# Patient Record
Sex: Female | Born: 1975
Health system: Southern US, Community
[De-identification: ages and names within clinical notes are randomized; demographics above are authoritative.]

## PROBLEM LIST (undated history)

## (undated) ENCOUNTER — Emergency Department (HOSPITAL_COMMUNITY): Disposition: A | Discharge status: Home / Self Care | Payer: Self-pay

## (undated) DIAGNOSIS — R9082 White matter disease, unspecified: Secondary | ICD-10-CM

## (undated) DIAGNOSIS — K219 Gastro-esophageal reflux disease without esophagitis: Secondary | ICD-10-CM

## (undated) DIAGNOSIS — E041 Nontoxic single thyroid nodule: Secondary | ICD-10-CM

## (undated) DIAGNOSIS — K5900 Constipation, unspecified: Secondary | ICD-10-CM

## (undated) DIAGNOSIS — R102 Pelvic and perineal pain unspecified side: Secondary | ICD-10-CM

## (undated) DIAGNOSIS — B977 Papillomavirus as the cause of diseases classified elsewhere: Secondary | ICD-10-CM

## (undated) DIAGNOSIS — G44229 Chronic tension-type headache, not intractable: Secondary | ICD-10-CM

## (undated) DIAGNOSIS — G8929 Other chronic pain: Secondary | ICD-10-CM

## (undated) DIAGNOSIS — M545 Low back pain, unspecified: Secondary | ICD-10-CM

## (undated) DIAGNOSIS — F32A Depression, unspecified: Secondary | ICD-10-CM

## (undated) DIAGNOSIS — N946 Dysmenorrhea, unspecified: Secondary | ICD-10-CM

## (undated) DIAGNOSIS — M542 Cervicalgia: Secondary | ICD-10-CM

## (undated) DIAGNOSIS — E785 Hyperlipidemia, unspecified: Secondary | ICD-10-CM

## (undated) DIAGNOSIS — F419 Anxiety disorder, unspecified: Secondary | ICD-10-CM

## (undated) DIAGNOSIS — R7989 Other specified abnormal findings of blood chemistry: Secondary | ICD-10-CM

## (undated) DIAGNOSIS — R5383 Other fatigue: Secondary | ICD-10-CM

## (undated) DIAGNOSIS — Z5189 Encounter for other specified aftercare: Secondary | ICD-10-CM

## (undated) DIAGNOSIS — Z9889 Other specified postprocedural states: Secondary | ICD-10-CM

## (undated) DIAGNOSIS — H9313 Tinnitus, bilateral: Secondary | ICD-10-CM

## (undated) DIAGNOSIS — M26609 Unspecified temporomandibular joint disorder, unspecified side: Secondary | ICD-10-CM

## (undated) DIAGNOSIS — G5600 Carpal tunnel syndrome, unspecified upper limb: Secondary | ICD-10-CM

## (undated) DIAGNOSIS — M7500 Adhesive capsulitis of unspecified shoulder: Secondary | ICD-10-CM

## (undated) DIAGNOSIS — N840 Polyp of corpus uteri: Secondary | ICD-10-CM

## (undated) HISTORY — DX: Anxiety disorder, unspecified: F41.9

## (undated) HISTORY — DX: Dysmenorrhea, unspecified: N94.6

## (undated) HISTORY — DX: Depression, unspecified: F32.A

## (undated) HISTORY — DX: Other chronic pain: G89.29

## (undated) HISTORY — DX: Adhesive capsulitis of unspecified shoulder: M75.00

## (undated) HISTORY — PX: CHOLECYSTECTOMY: SHX55

## (undated) HISTORY — DX: Hyperlipidemia, unspecified: E78.5

## (undated) HISTORY — DX: Chronic tension-type headache, not intractable: G44.229

## (undated) HISTORY — PX: TONSILLECTOMY: SUR1361

## (undated) HISTORY — DX: Cervicalgia: M54.2

## (undated) HISTORY — PX: WISDOM TOOTH EXTRACTION: SHX21

## (undated) HISTORY — DX: Encounter for other specified aftercare: Z51.89

## (undated) HISTORY — PX: WRIST SURGERY: SHX841

## (undated) HISTORY — PX: DE QUERVAIN'S RELEASE: SHX1439

---

## 1999-03-11 ENCOUNTER — Ambulatory Visit (HOSPITAL_BASED_OUTPATIENT_CLINIC_OR_DEPARTMENT_OTHER): Admission: RE | Admit: 1999-03-11 | Discharge: 1999-03-11 | Payer: Self-pay | Admitting: *Deleted

## 1999-07-02 ENCOUNTER — Other Ambulatory Visit: Admission: RE | Admit: 1999-07-02 | Discharge: 1999-07-02 | Payer: Self-pay | Admitting: Obstetrics and Gynecology

## 2003-06-28 ENCOUNTER — Emergency Department (HOSPITAL_COMMUNITY): Admission: EM | Admit: 2003-06-28 | Discharge: 2003-06-28 | Payer: Self-pay | Admitting: Emergency Medicine

## 2004-05-29 ENCOUNTER — Inpatient Hospital Stay (HOSPITAL_COMMUNITY): Admission: EM | Admit: 2004-05-29 | Discharge: 2004-05-31 | Payer: Self-pay | Admitting: Emergency Medicine

## 2006-06-28 ENCOUNTER — Emergency Department (HOSPITAL_COMMUNITY): Admission: EM | Admit: 2006-06-28 | Discharge: 2006-06-28 | Payer: Self-pay | Admitting: Emergency Medicine

## 2007-01-04 ENCOUNTER — Emergency Department (HOSPITAL_COMMUNITY): Admission: EM | Admit: 2007-01-04 | Discharge: 2007-01-04 | Payer: Self-pay | Admitting: Emergency Medicine

## 2007-05-09 ENCOUNTER — Emergency Department (HOSPITAL_COMMUNITY): Admission: EM | Admit: 2007-05-09 | Discharge: 2007-05-09 | Payer: Self-pay | Admitting: Emergency Medicine

## 2007-05-10 ENCOUNTER — Emergency Department (HOSPITAL_COMMUNITY): Admission: EM | Admit: 2007-05-10 | Discharge: 2007-05-10 | Payer: Self-pay | Admitting: Emergency Medicine

## 2007-11-06 ENCOUNTER — Emergency Department: Payer: Self-pay | Admitting: Emergency Medicine

## 2007-11-26 ENCOUNTER — Emergency Department: Payer: Self-pay | Admitting: Emergency Medicine

## 2008-10-12 ENCOUNTER — Emergency Department: Payer: Self-pay | Admitting: Emergency Medicine

## 2009-05-07 ENCOUNTER — Emergency Department: Payer: Self-pay | Admitting: Unknown Physician Specialty

## 2009-11-15 ENCOUNTER — Emergency Department: Payer: Self-pay | Admitting: Emergency Medicine

## 2011-01-29 NOTE — H&P (Signed)
NAME:  [name], [name]                           ACCOUNT NO.:  [id number]   MEDICAL RECORD NO.:  [id number]                   PATIENT TYPE:  EMS   LOCATION:  MAJO                                 FACILITY:  [facility]   PHYSICIAN:  Isidor Holts, M.D.               DATE OF BIRTH:  1976/05/28   DATE OF ADMISSION:  05/29/2004  DATE OF DISCHARGE:                                HISTORY & PHYSICAL   PRIMARY MEDICAL DOCTOR:  Unassigned.   CHIEF COMPLAINT:  Unsteadiness, approximately nine days, also persistent  occipital headaches, approximately one year.   HISTORY OF PRESENT ILLNESS:  This is a 35 year old Caucasian female who  according to her has had constant occipital headaches, approximately one  year now.  These are dull in character, fluctuating in intensity, not  associated with blurring of vision, nausea, or tinnitus.  She went to see  her PMD at [facility] approximately 10 days ago and was  prescribed Medigesic one tablet p.o. t.i.d.  On the very next day she  developed unsteadiness, making it difficult to ambulate.  As a matter of  fact, has to reach out to support herself to keep from falling over.  Denies  associated limb weakness.  Called her PMD and medications were discontinued  apparently.  She is now off the medication for approximately six days.  Symptoms have persisted, however.  She called her PMD again, who referred  her to the emergency department.   PAST MEDICAL HISTORY:  1.  Status post repair of De Quervain's tendinitis April 28, 2004.  2.  Status post ganglion removal, dorsum of wrist, x3.  3.  She is status post tonsillectomy.  4.  Status post wisdom teeth extraction.   MEDICATIONS:  Bupap one to two p.r.n. q.4-6h.   ALLERGIES OR SENSITIVITIES:  CODEINE causes abdominal discomfort.   SOCIAL HISTORY:  Divorced 2001, has one offspring who is alive and well.  Is  a smoker, one pack of cigarettes per day for approximately 10 years.  Drinks  alcohol  occasionally.  Denies drug abuse.   FAMILY HISTORY:  Both parents are living.  Mother has hypothyroidism,  tinnitus, partial hearing loss.  Father is alive and well.  The patient has  two maternal aunts who have Meniere's disease.  Has one brother and two  sisters, all of whom are alive and well.   REVIEW OF SYSTEMS:  As per HPI and chief complaint.  Had a fever of 101  approximately two days ago.  No associated respiratory tract symptoms.  No  abdominal pain or chest pain.  Symptoms have now resolved.  RESPIRATIONS:  Negative.   PHYSICAL EXAMINATION:  VITAL SIGNS:  Temperature 98.5, pulse 84 per minute  and regular, respiratory rate 16, BP 97/74 mmHg.  Pulse oximetry on room air  98%.  GENERAL:  The patient appears comfortable, communicative, not short of  breath at rest, not  in obvious acute distress.  HEENT:  No frank pallor, no jaundice, no conjunctival injection or corneal  opacity.  Normal extraocular eye muscle movements.  NECK:  Supple, JVP not seen.  No palpable lymphadenopathy, no palpable  goiter, no carotid bruits.  CHEST:  Completely clear to auscultation, no wheezes, no crackles.  CARDIAC:  Heart sounds one and two heard, normal, regular, no murmurs.  ABDOMEN:  Flat, soft, and nontender.  There is no palpable organomegaly, no  palpable masses, normal bowel sounds.  LOWER EXTREMITIES:  No pitting edema.  Palpable peripheral pulses.  MUSCULOSKELETAL:  Full range of motion of all joints, no joint swelling,  warmth or tenderness noted.  CENTRAL NERVOUS SYSTEM:  Alert and oriented x3.  No cranial nerve deficit.  Has truncal ataxia, positive rombergism with an unsteady gait.  Otherwise no  cerebellar signs.  Power is 4+/5 all limbs.  Tone is normal all limbs.  She  has normal tendon reflexes.  Downgoing plantar reflexes bilaterally.   INVESTIGATIONS:  Electrolytes:  Sodium 140, potassium 4.1, chloride 112, CO2  23, BUN 8, creatinine 0.7, glucose 90.  Hemoglobin 14.3,  hematocrit 42.0,  WBC not done.  At this time the patient is status post MRI brain at [facility]  Imaging May 28, 2004.  According to the patient, this was reportedly  normal.   ASSESSMENT AND PLAN:  1.  Chronic headaches.  Differential diagnosis is huge, including migraine      headache, tension-type headaches, analgesic rebound headache, etc.  We      shall commence the patient on Darvon N-100 one p.o. p.r.n. q.6h. for      headache for now and monitor.  Certainly neurologic input would be      helpful here.  2.  Unsteadiness/ataxia, nine to 10 days' duration, but no focal neurologic      deficit on examination although the patient does have indeed have      truncal ataxia and positive rombergism.  Main differentials include 1)      medication side effect; 2) demyelinating disease; 3) hysteria.  It is      noted the patient is status post brain MRI done two days ago, which was      reportedly normal.  This was done at an outside facility, and reports      are not available immediately.  Will therefore repeat brain MRI with      contrast to rule out demyelination or other intracranial abnormality.      Meanwhile, will do neurologic checks q.6h., institute fall precautions,      and carry out physical therapy/occupational therapy.  A neurology      consultation has been requested, and will manage per neurology      recommendations.  Further management will depend on clinical course.      The patient is admitted for evaluation and workup and treatment.       CO/MEDQ  D:  05/29/2004  T:  05/30/2004  Job:  [id number]

## 2011-01-29 NOTE — Discharge Summary (Signed)
[name], [name]                 ACCOUNT NO.:  [id number]   MEDICAL RECORD NO.:  [id number]          PATIENT TYPE:  INP   LOCATION:  5003                         FACILITY:  [facility]   PHYSICIAN:  Marcie Mowers, M.D.DATE OF BIRTH:  04-01-1976   DATE OF ADMISSION:  05/29/2004  DATE OF DISCHARGE:  05/31/2004                                 DISCHARGE SUMMARY   DISCHARGE DIAGNOSES:  1.  Chronic headache.  2. Vertigo.  3. Anxiety disorder.   DISCHARGE MEDICATIONS:  1.  Lexapro 10 mg p.o. daily.  2. Flexeril 5 mg one pill q.6h. p.r.n.  3.      Oxycodone 5 mg one to two pills every four hours as needed.   CONSULTATIONS:  Neurology, Dr. Avie Echevaria.   HISTORY AND PHYSICAL:  For a detailed H&P please refer to history and  physical done by Dr. Brien Few on 05/29/04.  Briefly this is a 35 year old  Caucasian female who came with a chief complaint of persistent occipital  headaches for one year and unsteadiness for approximately nine days.  The  headache, she explained, was dull in nature, fluctuating in intensity, not  associated with any other symptoms like nausea, tinnitus, or blurring of  vision.  She saw her primary care physician at [facility] in  [place] about 10 days prior to admission and was prescribed Medigesic one  tablet p.o. t.i.d.  The next day she developed unsteadiness, it was  difficult for her to ambulate.  The medication was discontinued and then she  presented to [facility].  She was off of the medication named  Medigesic for approximately six days.  Her symptoms were persistent and  therefore she presented to the emergency department.   PAST MEDICAL HISTORY:  1.  A pair of De Quervain's tenonitis.  2. Anxiety disorder.  3. Status post      tonsillectomy.  4. Status post wisdom tooth extraction.  5. Status post      ganglion removal from the dorsum of her wrist x3.   MEDICATIONS AT HOME:  Bupap one or two pills p.r.n. q.4-6h.   PHYSICAL  EXAMINATION:  Was significant for presence of truncal ataxia,  positive Romberg's sign, unsteady gait.  Otherwise the exam was  unremarkable.   ADMISSION LABORATORY:  Her sodium was 140, potassium 4.1, chloride 112,  glucose 90, BUN of 8, creatinine 0.7.  CBC done 9/17 showed a white count of  7.6, hemoglobin 14.7, hematocrit 42 and platelet count of 230,000.  Total  bilirubin was 0.3, alkaline phosphatase 79, AST 22, ALT 25, total protein  6.4, albumin 3.8, calcium 9.2.  Lactic acid venous 1.2.  ESR 1 p.r.n. drug  screen was positive for benzodiazepines and barbiturates.   RADIOLOGY DATA:  1.  MRI brain done 9/17 showed small right frontal periventricular white      matter lesion, nonspecific, could be due to MS or chronic ischemic      change.  No acute abnormalities.  2. Intracranial MRA showed no      intracranial stenosis or occlusion, no aneurysm.   HOSPITAL  COURSE:  Problem 1. Chronic headache.  The patient was admitted to  the general medicine floor, she was started on Darvon-N 100 for pain  control.  MRI and MRA of brain were obtained and the results are explained  above.  The headache was difficult to control with Darvon and therefore on  day #2 of admission, she was switched to Lortab 500/10 one pill p.o. q.4-6h.  p.r.n.  It failed to relieve the headaches either.  She was also given  additional Motrin 600 mg p.r.n. q.6h.  She also received one dose of  morphine of 4 mg IV.  Neurology was consulted on the day of admission.  Overall thought was chronic daily headaches secondary to unclear etiology.  By the day of discharge, her headache was somewhat better but the patient  was not free of headache.  The findings of MRI brain were discussed at  length with neurologist Dr. Sandria Manly, it was decided to discharge the patient  home with followup with the neurologist in one to two weeks and also to  follow up with her primary care physician who was in [place].  She was  given oxycodone  for pain control.   Problem 2. Vertigo/unsteadiness.  The etiology of this symptom was unclear  at the point of discharge.  She was started on Flexeril by neurology.  Considering the normal blood work as explained above, and non significant  findings on MRI and MRA, and considering that her symptom of unsteadiness  and vertigo was much improved, within the three days of admission, she was  discharged to home to follow up with neurology within one to two weeks and  also to follow up with her primary care physician.   Problem 3. Hypokalemia.  On day #2 of hospital stay, the patient's potassium  fell down to 2.3.  It was repleted with oral potassium supplement.  On the  day of discharge her potassium was 3.7.   Problem 4. Anxiety disorder.  The patient was started on Lexapro 10 mg p.o.  daily, she was to continue taking Lexapro and follow up with her primary  care physician.   DISPOSITION:  The patient was discharged to home.  As mentioned above, she  was to see a neurologist within one to two weeks or sooner if necessary.  She was also to follow up with her primary care physician in [place].  She  has had MRI done in [place] in the past and she was to take that MRI film  with her when she would see the neurologist.   CONDITION ON DISCHARGE:  She still had mild headaches, her balance was much  better although she was slightly wobbly still.  Her vital signs were normal  and her discharge labs include sodium of 137, potassium of 3.7, chloride  111, CO2 22, BUN 10, creatinine of 0.6 and blood sugar of 93.  Calcium was  8.5.       PMJ/MEDQ  D:  07/25/2004  T:  07/25/2004  Job:  [id number]

## 2011-01-29 NOTE — Consult Note (Signed)
NAMEVANETA, [name]                           ACCOUNT NO.:  [id number]   MEDICAL RECORD NO.:  [id number]                   PATIENT TYPE:  EMS   LOCATION:  MAJO                                 FACILITY:  [facility]   PHYSICIAN:  Genene Churn. Love, M.D.                 DATE OF BIRTH:  07/11/1976   DATE OF CONSULTATION:  05/29/2004  DATE OF DISCHARGE:                                   CONSULTATION   This 35 year old left handed white divorced female was seen in the emergency  room for evaluation of headaches and vertigo.   HISTORY OF PRESENT ILLNESS:  This patient is from [place], [place],  and has been followed by personal physician in the [place] community. She  has a 1-year history of headaches usually occurring in the right frontal  occipital region described as dull and tight but occurring on a daily basis  unassociated with visual disturbance, nausea and vomiting, changes bending  over, cough, sneeze, or Valsalva maneuver. For these headaches, she has been  taking Excedrin at least one per day. She thinks she is probably taking 15  to 30 per month over the last year. Recently, she was seen by her personal  physician placed on a medicine called Medigesic but developed symptoms of  dizziness with this medicine which was discontinued. She was then placed on  meclizine and Bupap for headaches. Over the last week since she last took  that medicine, she has noted no improvement in her headaches, nor has she  had any improvement in her dizziness characterized by vague sensation of  gait disorder occurring in the sitting, lying and standing position without  true spinning and without any associated nausea and vomiting, hearing  change, or tinnitus. She does have a history of diarrhea with these  episodes. She believes that she had a fever yesterday to as high as 101  degrees. She has been falling and has multiple bruises mostly on her left  shin region. She is scheduled to see a  neurologist in [place] in  approximately one week and yesterday underwent a MRI study of the brain in  [place], the results of which were reported to the patient as being normal.  She denies any double vision, swallowing problems, slurred speech,  blackouts, Bell's, or seizures. She is currently on Bupap as her only  medication.   PAST MEDICAL HISTORY:  Surgery for tenosynovitis to her left hand and also  for recurrent ganglion cysts. She has no known personal history of migraine  but does have a positive family history of migraine. She denies a history of  car sickness as a child. She has never had high blood pressure, diabetes,  heart disease, stroke, cancer, convulsions, unconsciousness, or venereal  disease. She does admit to a history of anxiety. She is divorced and has a 9-  year-old son. She has a history of allergy to codeine,  smokes cigarettes,  finished high school, and is currently attending a community college. She is  not currently working.   PHYSICAL EXAMINATION:  GENERAL:  Examination revealed a well-developed white  female in no acute distress who appeared in no acute distress.  VITAL SIGNS:  Blood pressure in the right and left arm is 110/60 in the  right arm and 110/60 in the left arm. The heart rate was 64, and there were  no bruits.  HEENT:  Both tympanic membranes were seen and clear.  LUNGS:  The lungs were clear.  NEUROLOGICAL:  Mental status:  She was alert and oriented x3. The cranial  nerve examination revealed visual fields full, disk flat, spontaneous venous  pulsations seen, extraocular movements were full, and there was nystagmus.  Corneals were present, and facial sensation was equal. There was no facial  motor asymmetry. Hearing was intact. Tongue was midline, uvula was midline,  and gags were present. Motor examination revealed good strength in the upper  and in the lower extremities. She had good finger to nose, good heel to  shin, and sensory  examination intact to pinprick and touch, joint position,  and vibration testing. Deep tendon reflexes were 2+. Plantar responses were  downgoing. Her gait examination revealed astasia-abasia. There was no  evidence of past point to the right or to the left and with jerk movements,  there was no evidence of any catch up saccades.   IMPRESSION:  1.  Chronic headaches, code 284.0.  2.  Dizziness, code 780.4.  3.  Anxiety, code 300.00.   PLAN:  The plan at this time is to admit the patient for Flexeril 5 mg q.4-  6h. p.r.n. pain, Lexapro 5 mg q.d., MRI study of the brain without contrast  enhancement, intracranial MRA, sed rate, and lactic acid level.      JML/MEDQ  D:  05/29/2004  T:  06/01/2004  Job:  [id number]

## 2011-04-03 ENCOUNTER — Emergency Department: Payer: Self-pay | Admitting: Unknown Physician Specialty

## 2011-10-22 ENCOUNTER — Emergency Department: Payer: Self-pay | Admitting: Emergency Medicine

## 2013-03-18 ENCOUNTER — Encounter (HOSPITAL_COMMUNITY): Payer: Self-pay | Admitting: Emergency Medicine

## 2013-03-18 ENCOUNTER — Emergency Department (HOSPITAL_COMMUNITY): Payer: No Typology Code available for payment source

## 2013-03-18 ENCOUNTER — Emergency Department (HOSPITAL_COMMUNITY)
Admission: EM | Admit: 2013-03-18 | Discharge: 2013-03-18 | Disposition: A | Discharge status: Home / Self Care | Payer: No Typology Code available for payment source | Attending: Emergency Medicine | Admitting: Emergency Medicine

## 2013-03-18 DIAGNOSIS — S5010XA Contusion of unspecified forearm, initial encounter: Secondary | ICD-10-CM | POA: Insufficient documentation

## 2013-03-18 DIAGNOSIS — W108XXA Fall (on) (from) other stairs and steps, initial encounter: Secondary | ICD-10-CM | POA: Insufficient documentation

## 2013-03-18 DIAGNOSIS — Y9389 Activity, other specified: Secondary | ICD-10-CM | POA: Insufficient documentation

## 2013-03-18 DIAGNOSIS — F17200 Nicotine dependence, unspecified, uncomplicated: Secondary | ICD-10-CM | POA: Insufficient documentation

## 2013-03-18 DIAGNOSIS — S20222A Contusion of left back wall of thorax, initial encounter: Secondary | ICD-10-CM

## 2013-03-18 DIAGNOSIS — Z3202 Encounter for pregnancy test, result negative: Secondary | ICD-10-CM | POA: Insufficient documentation

## 2013-03-18 DIAGNOSIS — S20229A Contusion of unspecified back wall of thorax, initial encounter: Secondary | ICD-10-CM | POA: Insufficient documentation

## 2013-03-18 DIAGNOSIS — Y929 Unspecified place or not applicable: Secondary | ICD-10-CM | POA: Insufficient documentation

## 2013-03-18 MED ORDER — HYDROCODONE-ACETAMINOPHEN 5-325 MG PO TABS: 1.0000 | ORAL_TABLET | ORAL | Status: DC | PRN

## 2013-03-18 NOTE — ED Notes (Signed)
Ice pack given

## 2013-03-18 NOTE — ED Notes (Signed)
Pt tripped and fell down 4 steps this am. Denies hitting head or LOC. Pt c/o pain to Left lower back/upper buttocks. Left fa pain. No obvious deformities or bruising noted. Nad.

## 2013-03-18 NOTE — ED Provider Notes (Signed)
History    CSN: [id number] Arrival date & time 03/18/13  8295  First MD Initiated Contact with Patient 03/18/13 [id number]     Chief Complaint  Patient presents with  . Fall   (Consider location/radiation/quality/duration/timing/severity/associated sxs/prior Treatment) HPI Comments: [name] is a 37 y.o. Female presenting with pain in her left posterior lower back and pelvis after slipping down 4 steps this am,  Landing against the step with her lower back.  She also reports having pain in the left forearm which has since resolved.  She denies loc,  Did not hit her head and had no dizziness prior to the event.  She has taken ibuprofen with no improvement in symptoms.     The history is provided by the patient.   History reviewed. No pertinent past medical history. Past Surgical History  Procedure Laterality Date  . Wrist surgery    . Tonsillectomy     History reviewed. No pertinent family history. History  Substance Use Topics  . Smoking status: Current Every Day Smoker  . Smokeless tobacco: Not on file  . Alcohol Use: Not on file     Comment: occ   OB History   Grav Para Term Preterm Abortions TAB SAB Ect Mult Living                 Review of Systems  Constitutional: Negative for fever.  Respiratory: Negative for shortness of breath.   Cardiovascular: Negative for chest pain and leg swelling.  Gastrointestinal: Negative for abdominal pain, constipation and abdominal distention.  Genitourinary: Negative for dysuria, urgency, frequency, flank pain and difficulty urinating.  Musculoskeletal: Positive for arthralgias. Negative for joint swelling and gait problem.  Skin: Negative for rash.  Neurological: Negative for weakness and numbness.    Allergies  Review of patient's allergies indicates no known allergies.  Home Medications   Current Outpatient Rx  Name  Route  Sig  Dispense  Refill  . ibuprofen (ADVIL,MOTRIN) 200 MG tablet   Oral   Take 200 mg by mouth  every 6 (six) hours as needed for pain.         . Multiple Vitamin (MULTIVITAMIN WITH MINERALS) TABS   Oral   Take 1 tablet by mouth daily.         Marland Kitchen HYDROcodone-acetaminophen (NORCO/VICODIN) 5-325 MG per tablet   Oral   Take 1 tablet by mouth every 4 (four) hours as needed for pain.   15 tablet   0    BP 123/90  Pulse 83  Temp(Src) 97.9 F (36.6 C) (Oral)  Resp 16  SpO2 96%  LMP 02/26/2013 Physical Exam  Nursing note and vitals reviewed. Constitutional: She appears well-developed and well-nourished.  HENT:  Head: Normocephalic.  Eyes: Conjunctivae are normal.  Neck: Normal range of motion. Neck supple.  Cardiovascular: Normal rate and intact distal pulses.   Pedal pulses normal.  Pulmonary/Chest: Effort normal.  Abdominal: Soft. Bowel sounds are normal. She exhibits no distension and no mass.  Musculoskeletal: Normal range of motion. She exhibits edema.       Lumbar back: She exhibits tenderness. She exhibits no swelling, no edema and no spasm.  Mild soft edema along left posterior pelvic rim.  No ecchymosis, no induration. No pain with palpation of lumbar spine.  Left forearm nontender to palpation,  Displays FROM of wrist, elbow,  Can supinate and pronate forearm without pain.  Equal grip strength.  Neurological: She is alert. She has normal strength. She displays no atrophy  and no tremor. No sensory deficit. Gait normal.  Reflex Scores:      Patellar reflexes are 2+ on the right side and 2+ on the left side.      Achilles reflexes are 2+ on the right side and 2+ on the left side. No strength deficit noted in hip and knee flexor and extensor muscle groups.  Ankle flexion and extension intact.  Skin: Skin is warm and dry.  Psychiatric: She has a normal mood and affect.    ED Course  Procedures (including critical care time) Labs Reviewed  POCT PREGNANCY, URINE   Dg Pelvis 1-2 Views  03/18/2013   *RADIOLOGY REPORT*  Clinical Data: Larey Seat down stairs, pelvic pain   PELVIS - 1-2 VIEW  Comparison: The  Findings: Single frontal view of the pelvis demonstrates no acute fracture or malalignment.  No significant degenerative changes. Normal bony mineralization.  No lytic or blastic osseous lesion. Unremarkable visualized bowel gas pattern.  IMPRESSION: No acute abnormality.   Original Report Authenticated By: Malachy Moan, M.D.   1. Contusion of back, left, initial encounter     MDM  Patients labs and/or radiological studies were viewed and considered during the medical decision making and disposition process.  Patient with fall and contusion to left lower back, left forearm,  Already improved.  Encouraged rest,  Ice packs, continued ibuprofen.  Few hydrocodone prescribed for additional pain relief.  Recommend recheck if not improved over the next week.    The patient appears reasonably screened and/or stabilized for discharge and I doubt any other medical condition or other [facility] requiring further screening, evaluation, or treatment in the ED at this time prior to discharge.   Burgess Amor, PA-C 03/18/13 1606

## 2013-03-19 NOTE — ED Provider Notes (Signed)
Medical screening examination/treatment/procedure(s) were performed by non-physician practitioner and as supervising physician I was immediately available for consultation/collaboration.  Donnetta Hutching, MD 03/19/13 [phone]

## 2014-12-18 DIAGNOSIS — F411 Generalized anxiety disorder: Secondary | ICD-10-CM | POA: Insufficient documentation

## 2015-05-10 ENCOUNTER — Emergency Department (INDEPENDENT_AMBULATORY_CARE_PROVIDER_SITE_OTHER)
Admission: EM | Admit: 2015-05-10 | Discharge: 2015-05-10 | Disposition: A | Discharge status: Home / Self Care | Payer: BLUE CROSS/BLUE SHIELD | Source: Home / Self Care

## 2015-05-10 ENCOUNTER — Encounter (HOSPITAL_COMMUNITY): Payer: Self-pay | Admitting: Emergency Medicine

## 2015-05-10 DIAGNOSIS — M25472 Effusion, left ankle: Secondary | ICD-10-CM | POA: Diagnosis not present

## 2015-05-10 MED ORDER — INDOMETHACIN 50 MG PO CAPS: 50.0000 mg | ORAL_CAPSULE | Freq: Three times a day (TID) | ORAL | Status: DC | PRN

## 2015-05-10 NOTE — ED Provider Notes (Signed)
CSN: [id number]     Arrival date & time 05/10/15  1900 History   None    Chief Complaint  Patient presents with  . Ankle Pain   (Consider location/radiation/quality/duration/timing/severity/associated sxs/prior Treatment) Patient is a 39 y.o. female presenting with ankle pain. The history is provided by the patient. No language interpreter was used.  Ankle Pain Location:  Ankle Time since incident:  3 days Ankle location:  L ankle Pain details:    Quality:  Aching   Radiates to:  Does not radiate   Severity:  Moderate   Onset quality:  Gradual   Timing:  Constant   Progression:  Worsening (Pain with ambulation) Prior injury to area:  No Relieved by:  Elevation Worsened by:  Bearing weight Associated symptoms: swelling   Associated symptoms: no fever, no numbness and no stiffness     History reviewed. No pertinent past medical history. Past Surgical History  Procedure Laterality Date  . Wrist surgery    . Tonsillectomy     No family history on file. Social History  Substance Use Topics  . Smoking status: Current Every Day Smoker  . Smokeless tobacco: None  . Alcohol Use: None     Comment: occ   OB History    No data available     Review of Systems  Constitutional: Negative for fever.  Respiratory: Negative.   Cardiovascular: Negative.   Gastrointestinal: Negative.   Musculoskeletal: Positive for joint swelling. Negative for stiffness.       Ankle pain  All other systems reviewed and are negative.   Allergies  Review of patient's allergies indicates no known allergies.  Home Medications   Prior to Admission medications   Medication Sig Start Date End Date Taking? Authorizing Provider  HYDROcodone-acetaminophen (NORCO/VICODIN) 5-325 MG per tablet Take 1 tablet by mouth every 4 (four) hours as needed for pain. 03/18/13   Burgess Amor, PA-C  ibuprofen (ADVIL,MOTRIN) 200 MG tablet Take 200 mg by mouth every 6 (six) hours as needed for pain.    Historical Provider,  MD  Multiple Vitamin (MULTIVITAMIN WITH MINERALS) TABS Take 1 tablet by mouth daily.    Historical Provider, MD   Meds Ordered and Administered this Visit  Medications - No data to display  BP 112/85 mmHg  Pulse 84  Temp(Src) 98.5 F (36.9 C) (Oral)  Resp 18  SpO2 97%  LMP 05/03/2015 No data found.   Physical Exam  Constitutional: She appears well-developed. No distress.  Cardiovascular: Normal rate, regular rhythm, normal heart sounds and intact distal pulses.   No murmur heard. Pulmonary/Chest: Effort normal and breath sounds normal. No respiratory distress. She has no wheezes.  Musculoskeletal:       Right ankle: Normal.       Left ankle: She exhibits swelling. She exhibits normal range of motion and no deformity. Achilles tendon normal.       Feet:  Nursing note and vitals reviewed.   ED Course  Procedures (including critical care time)  Labs Review Labs Reviewed - No data to display  Imaging Review No results found.   Visual Acuity Review  Right Eye Distance:   Left Eye Distance:   Bilateral Distance:    Right Eye Near:   Left Eye Near:    Bilateral Near:         MDM  No diagnosis found. Ankle swelling, left  Etiology of swelling and pain unclear. Possible gout, no traumatic injury hence xray not indicated. Indocin given prn pain. Recommended  warm massage. Advised to see PCP soon if no improvement for reassessment. She agreed with plan.    Doreene Eland, MD 05/10/15 2151

## 2015-05-10 NOTE — Discharge Instructions (Signed)
Heat Therapy °Heat therapy can help make painful, stiff muscles and joints feel better. Do not use heat on new injuries. Wait at least 48 hours after an injury to use heat. Do not use heat when you have aches or pains right after an activity. If you still have pain 3 hours after stopping the activity, then you may use heat. °HOME CARE °Wet heat pack °· Soak a clean towel in warm water. Squeeze out the extra water. °· Put the warm, wet towel in a plastic bag. °· Place a thin, dry towel between your skin and the bag. °· Put the heat pack on the area for 5 minutes, and check your skin. Your skin may be pink, but it should not be red. °· Leave the heat pack on the area for 15 to 30 minutes. °· Repeat this every 2 to 4 hours while awake. Do not use heat while you are sleeping. °Warm water bath °· Fill a tub with warm water. °· Place the affected body part in the tub. °· Soak the area for 20 to 40 minutes. °· Repeat as needed. °Hot water bottle °· Fill the water bottle half full with hot water. °· Press out the extra air. Close the cap tightly. °· Place a dry towel between your skin and the bottle. °· Put the bottle on the area for 5 minutes, and check your skin. Your skin may be pink, but it should not be red. °· Leave the bottle on the area for 15 to 30 minutes. °· Repeat this every 2 to 4 hours while awake. °Electric heating pad °· Place a dry towel between your skin and the heating pad. °· Set the heating pad on low heat. °· Put the heating pad on the area for 10 minutes, and check your skin. Your skin may be pink, but it should not be red. °· Leave the heating pad on the area for 20 to 40 minutes. °· Repeat this every 2 to 4 hours while awake. °· Do not lie on the heating pad. °· Do not fall asleep while using the heating pad. °· Do not use the heating pad near water. °GET HELP RIGHT AWAY IF: °· You get blisters or red skin. °· Your skin is puffy (swollen), or you lose feeling (numbness) in the affected area. °· You  have any new problems. °· Your problems are getting worse. °· You have any questions or concerns. °If you have any problems, stop using heat therapy until you see your doctor. °MAKE SURE YOU: °· Understand these instructions. °· Will watch your condition. °· Will get help right away if you are not doing well or get worse. °Document Released: 11/22/2011 Document Reviewed: 10/23/2013 °[employer]® Patient Information ©2015 [employer]. This information is not intended to replace advice given to you by your health care provider. Make sure you discuss any questions you have with your health care provider. ° °

## 2015-05-10 NOTE — ED Notes (Signed)
C/o left ankle pain onset 3 days associated w/swelling Pain increases w/activity Denies inj/trauma Steady gait... No acute distress.

## 2015-05-19 ENCOUNTER — Emergency Department (HOSPITAL_COMMUNITY)
Admission: EM | Admit: 2015-05-19 | Discharge: 2015-05-19 | Disposition: A | Discharge status: Home / Self Care | Payer: BLUE CROSS/BLUE SHIELD | Attending: Emergency Medicine | Admitting: Emergency Medicine

## 2015-05-19 ENCOUNTER — Encounter (HOSPITAL_COMMUNITY): Payer: Self-pay | Admitting: Emergency Medicine

## 2015-05-19 ENCOUNTER — Emergency Department (HOSPITAL_COMMUNITY): Payer: BLUE CROSS/BLUE SHIELD

## 2015-05-19 DIAGNOSIS — Z79899 Other long term (current) drug therapy: Secondary | ICD-10-CM | POA: Insufficient documentation

## 2015-05-19 DIAGNOSIS — W5501XA Bitten by cat, initial encounter: Secondary | ICD-10-CM | POA: Insufficient documentation

## 2015-05-19 DIAGNOSIS — S60021A Contusion of right index finger without damage to nail, initial encounter: Secondary | ICD-10-CM | POA: Diagnosis not present

## 2015-05-19 DIAGNOSIS — S61230A Puncture wound without foreign body of right index finger without damage to nail, initial encounter: Secondary | ICD-10-CM | POA: Diagnosis present

## 2015-05-19 DIAGNOSIS — S61031A Puncture wound without foreign body of right thumb without damage to nail, initial encounter: Secondary | ICD-10-CM | POA: Diagnosis not present

## 2015-05-19 DIAGNOSIS — Z23 Encounter for immunization: Secondary | ICD-10-CM | POA: Insufficient documentation

## 2015-05-19 DIAGNOSIS — Y998 Other external cause status: Secondary | ICD-10-CM | POA: Insufficient documentation

## 2015-05-19 DIAGNOSIS — Y9389 Activity, other specified: Secondary | ICD-10-CM | POA: Insufficient documentation

## 2015-05-19 DIAGNOSIS — Y9289 Other specified places as the place of occurrence of the external cause: Secondary | ICD-10-CM | POA: Insufficient documentation

## 2015-05-19 DIAGNOSIS — Z87891 Personal history of nicotine dependence: Secondary | ICD-10-CM | POA: Diagnosis not present

## 2015-05-19 DIAGNOSIS — S61451A Open bite of right hand, initial encounter: Secondary | ICD-10-CM

## 2015-05-19 MED ORDER — RABIES VACCINE, PCEC IM SUSR
1.0000 mL | Freq: Once | INTRAMUSCULAR | Status: AC
  Administered 2015-05-19: 1 mL via INTRAMUSCULAR
  Filled 2015-05-19: qty 1

## 2015-05-19 MED ORDER — ONDANSETRON HCL 4 MG PO TABS: 4.0000 mg | ORAL_TABLET | Freq: Three times a day (TID) | ORAL | Status: DC | PRN

## 2015-05-19 MED ORDER — HYDROCODONE-ACETAMINOPHEN 5-325 MG PO TABS
1.0000 | ORAL_TABLET | Freq: Once | ORAL | Status: AC
  Administered 2015-05-19: 1 via ORAL
  Filled 2015-05-19: qty 1

## 2015-05-19 MED ORDER — HYDROCODONE-ACETAMINOPHEN 5-325 MG PO TABS
1.0000 | ORAL_TABLET | ORAL | Status: DC | PRN
Start: 2015-05-19 — End: 2015-05-22

## 2015-05-19 MED ORDER — RABIES IMMUNE GLOBULIN 150 UNIT/ML IM INJ
20.0000 [IU]/kg | INJECTION | Freq: Once | INTRAMUSCULAR | Status: AC
  Administered 2015-05-19: 1425 [IU]
  Filled 2015-05-19: qty 9.5

## 2015-05-19 MED ORDER — AMOXICILLIN-POT CLAVULANATE 875-125 MG PO TABS: 1.0000 | ORAL_TABLET | Freq: Two times a day (BID) | ORAL | Status: DC

## 2015-05-19 NOTE — ED Notes (Signed)
Patient currently soaking hand in saline and hydrogen peroxide solution.

## 2015-05-19 NOTE — ED Notes (Signed)
Patient transported to X-ray 

## 2015-05-19 NOTE — Discharge Instructions (Signed)
Read the information below.  Use the prescribed medication as directed.  Please discuss all new medications with your pharmacist.  Do not take additional tylenol while taking the prescribed pain medication to avoid overdose.  You may return to the Emergency Department at any time for worsening condition or any new symptoms that concern you.   If there is any possibility that you might be pregnant or if you are breastfeeding, please let your health care provider know and discuss this with the pharmacist to ensure medication safety.  If you develop redness, swelling, pus draining from the wound, difficulty moving your fingers, or fevers greater than 100.4, return to the ER immediately for a recheck.     Animal Bite An animal bite can result in a scratch on the skin, deep open cut, puncture of the skin, crush injury, or tearing away of the skin or a body part. Dogs are responsible for most animal bites. Children are bitten more often than adults. An animal bite can range from very mild to more serious. A small bite from your house pet is no cause for alarm. However, some animal bites can become infected or injure a bone or other tissue. You must seek medical care if:  The skin is broken and bleeding does not slow down or stop after 15 minutes.  The puncture is deep and difficult to clean (such as a cat bite).  Pain, warmth, redness, or pus develops around the wound.  The bite is from a stray animal or rodent. There may be a risk of rabies infection.  The bite is from a snake, raccoon, skunk, fox, coyote, or bat. There may be a risk of rabies infection.  The person bitten has a chronic illness such as diabetes, liver disease, or cancer, or the person takes medicine that lowers the immune system.  There is concern about the location and severity of the bite. It is important to clean and protect an animal bite wound right away to prevent infection. Follow these steps:  Clean the wound with plenty of  water and soap.  Apply an antibiotic cream.  Apply gentle pressure over the wound with a clean towel or gauze to slow or stop bleeding.  Elevate the affected area above the heart to help stop any bleeding.  Seek medical care. Getting medical care within 8 hours of the animal bite leads to the best possible outcome. DIAGNOSIS  Your caregiver will most likely:  Take a detailed history of the animal and the bite injury.  Perform a wound exam.  Take your medical history. Blood tests or X-rays may be performed. Sometimes, infected bite wounds are cultured and sent to a lab to identify the infectious bacteria.  TREATMENT  Medical treatment will depend on the location and type of animal bite as well as the patient's medical history. Treatment may include:  Wound care, such as cleaning and flushing the wound with saline solution, bandaging, and elevating the affected area.  Antibiotics.  Tetanus immunization.  Rabies immunization.  Leaving the wound open to heal. This is often done with animal bites, due to the high risk of infection. However, in certain cases, wound closure with stitches, wound adhesive, skin adhesive strips, or staples may be used. Infected bites that are left untreated may require intravenous (IV) antibiotics and surgical treatment in the hospital. HOME CARE INSTRUCTIONS  Follow your caregiver's instructions for wound care.  Take all medicines as directed.  If your caregiver prescribes antibiotics, take them as directed.  Finish them even if you start to feel better.  Follow up with your caregiver for further exams or immunizations as directed. You may need a tetanus shot if:  You cannot remember when you had your last tetanus shot.  You have never had a tetanus shot.  The injury broke your skin. If you get a tetanus shot, your arm may swell, get red, and feel warm to the touch. This is common and not a problem. If you need a tetanus shot and you choose not  to have one, there is a rare chance of getting tetanus. Sickness from tetanus can be serious. SEEK MEDICAL CARE IF:  You notice warmth, redness, soreness, swelling, pus discharge, or a bad smell coming from the wound.  You have a red line on the skin coming from the wound.  You have a fever, chills, or a general ill feeling.  You have nausea or vomiting.  You have continued or worsening pain.  You have trouble moving the injured part.  You have other questions or concerns. MAKE SURE YOU:  Understand these instructions.  Will watch your condition.  Will get help right away if you are not doing well or get worse. Document Released: 05/18/2011 Document Revised: 11/22/2011 Document Reviewed: 05/18/2011 [facility] Patient Information 2015 [place], [place]. This information is not intended to replace advice given to you by your health care provider. Make sure you discuss any questions you have with your health care provider.

## 2015-05-19 NOTE — ED Provider Notes (Signed)
CSN: [id number]     Arrival date & time 05/19/15  1316 History  This chart was scribed for non-physician practitioner working Trixie Dredge PA-C with Rolan Bucco, MD by Lyndel Safe, ED Scribe. This patient was seen in room TR07C/TR07C and the patient's care was started at 1:53 PM.  Chief Complaint  Patient presents with  . Animal Bite    The history is provided by the patient. No language interpreter was used.   HPI Comments: [name] is a 39 y.o. female who presents to the Emergency Department complaining of multiple, small puncture wounds to right hand s/p stray cat bites that occurred 1.5 hours ago. The wounds are to right thumb and right 2nd and 3rd digit with associated ecchymosis to right 2nd finger. Pt also has abrasions to upper, anterior right thigh s/p cat scratchs.  The hand wounds are moderately painful; the pain is constant.  Pt reports she went to pick up a stray cat that was making strange noises and the cat attacked her, biting her right hand and scratching her right thigh. This incident occurred 1.5 hours ago. She washed the wounds with soap and water PTA. She did not call animal control after the incident. Pt is left handed. Tetanus UTD. No known allergies to antibiotics.  Denies weakness or numbness of the fingers.  Denies other injuries or any other concerning symptoms.    History reviewed. No pertinent past medical history. Past Surgical History  Procedure Laterality Date  . Wrist surgery    . Tonsillectomy     No family history on file. Social History  Substance Use Topics  . Smoking status: Former Games developer  . Smokeless tobacco: None  . Alcohol Use: None     Comment: occ   OB History    No data available     Review of Systems  Constitutional: Negative for fever and chills.  Respiratory: Negative for shortness of breath.   Gastrointestinal: Negative for vomiting.  Musculoskeletal: Negative for myalgias and joint swelling.  Skin: Positive for color change (  ecchymosis) and wound.  Allergic/Immunologic: Negative for immunocompromised state.  Neurological: Negative for weakness and numbness.  Hematological: Does not bruise/bleed easily.  Psychiatric/Behavioral: Negative for self-injury.   Allergies  Codeine  Home Medications   Prior to Admission medications   Medication Sig Start Date End Date Taking? Authorizing Provider  HYDROcodone-acetaminophen (NORCO/VICODIN) 5-325 MG per tablet Take 1 tablet by mouth every 4 (four) hours as needed for pain. 03/18/13   Burgess Amor, PA-C  ibuprofen (ADVIL,MOTRIN) 200 MG tablet Take 200 mg by mouth every 6 (six) hours as needed for pain.    Historical Provider, MD  indomethacin (INDOCIN) 50 MG capsule Take 1 capsule (50 mg total) by mouth 3 (three) times daily as needed for moderate pain (Take 3 times daily for 2 days then as needed). 05/10/15   Doreene Eland, MD  Multiple Vitamin (MULTIVITAMIN WITH MINERALS) TABS Take 1 tablet by mouth daily.    Historical Provider, MD   BP 145/79 mmHg  Pulse 84  Temp(Src) 98.2 F (36.8 C) (Oral)  Resp 16  Ht  (1.6 m)  Wt 156 lb 14.4 oz (71.169 kg)  BMI 27.80 kg/m2  SpO2 97%  LMP 04/26/2015 (Exact Date) Physical Exam  Constitutional: She appears well-developed and well-nourished. No distress.  HENT:  Head: Normocephalic and atraumatic.  Neck: Neck supple.  Pulmonary/Chest: Effort normal.  Neurological: She is alert. She exhibits normal muscle tone.  Skin: She is not  diaphoretic. No pallor.  Multiple puncture wounds over the area of the proximal phalanx of right index finger and right thumb; with surrounding ecchymosis and mild edema to right index finger; full, active ROM of all digits; distal sensation intact; capillary refill less than 2 seconds. No erythema,warmth, discharge. Right anterior thigh with linear abrasions consistent with cat scratches, no active bleeding, no ecchymosis.   Psychiatric: She has a normal mood and affect. Her behavior is normal.   Nursing note and vitals reviewed.    Note: small abraded area on third finger is noted by pt to be an accidental burn from hot aluminum foil sustained in her kitchen a few days ago. No erythema, edema, warmth, discharge, or tenderness   ED Course  Procedures  DIAGNOSTIC STUDIES: Oxygen Saturation is 97% on RA, normal by my interpretation.    COORDINATION OF CARE: 1:58 PM Discussed treatment plan with pt. Will order Xray of right hand. Rabies vaccine, rabies immune globulin, and pain medication ordered. Will clean and dress wound. Pt acknowledges and agrees to plan.   Labs Review Labs Reviewed - No data to display  Imaging Review Dg Hand Complete Right  05/19/2015   CLINICAL DATA:  Acute cat bite today to right hand with pain. Initial encounter.  EXAM: RIGHT HAND - COMPLETE 3+ VIEW  COMPARISON:  None.  FINDINGS: There is no evidence of fracture or dislocation. There is no evidence of arthropathy or other focal bone abnormality. Soft tissues are unremarkable. No radiopaque foreign bodies are identified.  IMPRESSION: Negative.   Electronically Signed   By: Harmon Pier M.D.   On: 05/19/2015 14:52   I have personally reviewed and evaluated these images and lab results as part of my medical decision-making.   EKG Interpretation None      MDM   Final diagnoses:  Cat bite    Afebrile, nontoxic patient with multiple cat bites to right hand without e/o involvement of flexor tendons or joint spaces.  Full AROM of all joints.  Not currently infected.  Wound cleansed by tech and by me, as well as by patient.  Tetanus is up to date.  Xray negative for FB.  Pt given pain medication here.  Pt does also have scratches on right anterior thigh, no bites.  D/C home with norco, augmentin, return precautions.  Discussed result, findings, treatment, and follow up  with patient.  Pt given return precautions.  Pt verbalizes understanding and agrees with plan.       I personally performed the services  described in this documentation, which was scribed in my presence. The recorded information has been reviewed and is accurate.    Trixie Dredge, PA-C 05/19/15 1737  Rolan Bucco, MD 05/25/15 [phone]

## 2015-05-19 NOTE — ED Notes (Signed)
Pt from home for eval of multiple bite marks to right hand from a stray cat, pt states she went to pick up the cat to take it inside when it bit her multiple times. Scratch mark to right upper thigh and bite marks noted to right hand with bruising to right index finger. Pulses present and no bleeding noted. nad noted.

## 2015-05-19 NOTE — ED Notes (Signed)
Declined W/C at D/C and was escorted to lobby by RN. 

## 2015-05-20 ENCOUNTER — Encounter (HOSPITAL_COMMUNITY): Payer: Self-pay | Admitting: Emergency Medicine

## 2015-05-20 ENCOUNTER — Emergency Department (HOSPITAL_COMMUNITY)
Admission: EM | Admit: 2015-05-20 | Discharge: 2015-05-20 | Disposition: A | Discharge status: Home / Self Care | Payer: BLUE CROSS/BLUE SHIELD | Attending: Emergency Medicine | Admitting: Emergency Medicine

## 2015-05-20 DIAGNOSIS — S61031D Puncture wound without foreign body of right thumb without damage to nail, subsequent encounter: Secondary | ICD-10-CM | POA: Insufficient documentation

## 2015-05-20 DIAGNOSIS — L03113 Cellulitis of right upper limb: Secondary | ICD-10-CM | POA: Insufficient documentation

## 2015-05-20 DIAGNOSIS — S61451D Open bite of right hand, subsequent encounter: Secondary | ICD-10-CM

## 2015-05-20 DIAGNOSIS — Z79899 Other long term (current) drug therapy: Secondary | ICD-10-CM | POA: Diagnosis not present

## 2015-05-20 DIAGNOSIS — Z87891 Personal history of nicotine dependence: Secondary | ICD-10-CM | POA: Diagnosis not present

## 2015-05-20 DIAGNOSIS — L03119 Cellulitis of unspecified part of limb: Secondary | ICD-10-CM

## 2015-05-20 DIAGNOSIS — W5501XD Bitten by cat, subsequent encounter: Secondary | ICD-10-CM | POA: Insufficient documentation

## 2015-05-20 DIAGNOSIS — S61234D Puncture wound without foreign body of right ring finger without damage to nail, subsequent encounter: Secondary | ICD-10-CM | POA: Insufficient documentation

## 2015-05-20 DIAGNOSIS — S61431D Puncture wound without foreign body of right hand, subsequent encounter: Secondary | ICD-10-CM | POA: Insufficient documentation

## 2015-05-20 LAB — I-STAT CHEM 8, ED
BUN: 13 mg/dL (ref 6–20)
CHLORIDE: 100 mmol/L — AB (ref 101–111)
CREATININE: 0.5 mg/dL (ref 0.44–1.00)
Calcium, Ion: 1.09 mmol/L — ABNORMAL LOW (ref 1.12–1.23)
GLUCOSE: 108 mg/dL — AB (ref 65–99)
HCT: 44 % (ref 36.0–46.0)
HEMOGLOBIN: 15 g/dL (ref 12.0–15.0)
POTASSIUM: 3.7 mmol/L (ref 3.5–5.1)
Sodium: 137 mmol/L (ref 135–145)
TCO2: 24 mmol/L (ref 0–100)

## 2015-05-20 LAB — CBC WITH DIFFERENTIAL/PLATELET
BASOS ABS: 0.1 10*3/uL (ref 0.0–0.1)
Basophils Relative: 0 % (ref 0–1)
Eosinophils Absolute: 0.3 10*3/uL (ref 0.0–0.7)
Eosinophils Relative: 2 % (ref 0–5)
HEMATOCRIT: 39.7 % (ref 36.0–46.0)
HEMOGLOBIN: 13.8 g/dL (ref 12.0–15.0)
LYMPHS PCT: 12 % (ref 12–46)
Lymphs Abs: 1.8 10*3/uL (ref 0.7–4.0)
MCH: 32.7 pg (ref 26.0–34.0)
MCHC: 34.8 g/dL (ref 30.0–36.0)
MCV: 94.1 fL (ref 78.0–100.0)
MONO ABS: 0.8 10*3/uL (ref 0.1–1.0)
Monocytes Relative: 6 % (ref 3–12)
NEUTROS ABS: 11.9 10*3/uL — AB (ref 1.7–7.7)
NEUTROS PCT: 80 % — AB (ref 43–77)
Platelets: 211 10*3/uL (ref 150–400)
RBC: 4.22 MIL/uL (ref 3.87–5.11)
RDW: 12.4 % (ref 11.5–15.5)
WBC: 14.9 10*3/uL — ABNORMAL HIGH (ref 4.0–10.5)

## 2015-05-20 MED ORDER — SODIUM CHLORIDE 0.9 % IV SOLN
3.0000 g | Freq: Once | INTRAVENOUS | Status: AC
  Administered 2015-05-20: 3 g via INTRAVENOUS
  Filled 2015-05-20: qty 3

## 2015-05-20 MED ORDER — OXYCODONE-ACETAMINOPHEN 5-325 MG PO TABS
1.0000 | ORAL_TABLET | Freq: Once | ORAL | Status: AC
  Administered 2015-05-20: 1 via ORAL
  Filled 2015-05-20: qty 1

## 2015-05-20 NOTE — Discharge Instructions (Signed)
Continue antibiotics and pain medications at home. Keep the hand elevated. Return tomorrow for recheck.  Cellulitis Cellulitis is an infection of the skin and the tissue beneath it. The infected area is usually red and tender. Cellulitis occurs most often in the arms and lower legs.  CAUSES  Cellulitis is caused by bacteria that enter the skin through cracks or cuts in the skin. The most common types of bacteria that cause cellulitis are staphylococci and streptococci. SIGNS AND SYMPTOMS   Redness and warmth.  Swelling.  Tenderness or pain.  Fever. DIAGNOSIS  Your health care provider can usually determine what is wrong based on a physical exam. Blood tests may also be done. TREATMENT  Treatment usually involves taking an antibiotic medicine. HOME CARE INSTRUCTIONS   Take your antibiotic medicine as directed by your health care provider. Finish the antibiotic even if you start to feel better.  Keep the infected arm or leg elevated to reduce swelling.  Apply a warm cloth to the affected area up to 4 times per day to relieve pain.  Take medicines only as directed by your health care provider.  Keep all follow-up visits as directed by your health care provider. SEEK MEDICAL CARE IF:   You notice red streaks coming from the infected area.  Your red area gets larger or turns dark in color.  Your bone or joint underneath the infected area becomes painful after the skin has healed.  Your infection returns in the same area or another area.  You notice a swollen bump in the infected area.  You develop new symptoms.  You have a fever. SEEK IMMEDIATE MEDICAL CARE IF:   You feel very sleepy.  You develop vomiting or diarrhea.  You have a general ill feeling (malaise) with muscle aches and pains. MAKE SURE YOU:   Understand these instructions.  Will watch your condition.  Will get help right away if you are not doing well or get worse. Document Released: 06/09/2005  Document Revised: 01/14/2014 Document Reviewed: 11/15/2011 [facility] Patient Information 2015 [place], [place]. This information is not intended to replace advice given to you by your health care provider. Make sure you discuss any questions you have with your health care provider.   Animal Bite An animal bite can result in a scratch on the skin, deep open cut, puncture of the skin, crush injury, or tearing away of the skin or a body part. Dogs are responsible for most animal bites. Children are bitten more often than adults. An animal bite can range from very mild to more serious. A small bite from your house pet is no cause for alarm. However, some animal bites can become infected or injure a bone or other tissue. You must seek medical care if:  The skin is broken and bleeding does not slow down or stop after 15 minutes.  The puncture is deep and difficult to clean (such as a cat bite).  Pain, warmth, redness, or pus develops around the wound.  The bite is from a stray animal or rodent. There may be a risk of rabies infection.  The bite is from a snake, raccoon, skunk, fox, coyote, or bat. There may be a risk of rabies infection.  The person bitten has a chronic illness such as diabetes, liver disease, or cancer, or the person takes medicine that lowers the immune system.  There is concern about the location and severity of the bite. It is important to clean and protect an animal bite wound right  away to prevent infection. Follow these steps:  Clean the wound with plenty of water and soap.  Apply an antibiotic cream.  Apply gentle pressure over the wound with a clean towel or gauze to slow or stop bleeding.  Elevate the affected area above the heart to help stop any bleeding.  Seek medical care. Getting medical care within 8 hours of the animal bite leads to the best possible outcome. DIAGNOSIS  Your caregiver will most likely:  Take a detailed history of the animal and the bite  injury.  Perform a wound exam.  Take your medical history. Blood tests or X-rays may be performed. Sometimes, infected bite wounds are cultured and sent to a lab to identify the infectious bacteria.  TREATMENT  Medical treatment will depend on the location and type of animal bite as well as the patient's medical history. Treatment may include:  Wound care, such as cleaning and flushing the wound with saline solution, bandaging, and elevating the affected area.  Antibiotics.  Tetanus immunization.  Rabies immunization.  Leaving the wound open to heal. This is often done with animal bites, due to the high risk of infection. However, in certain cases, wound closure with stitches, wound adhesive, skin adhesive strips, or staples may be used. Infected bites that are left untreated may require intravenous (IV) antibiotics and surgical treatment in the hospital. HOME CARE INSTRUCTIONS  Follow your caregiver's instructions for wound care.  Take all medicines as directed.  If your caregiver prescribes antibiotics, take them as directed. Finish them even if you start to feel better.  Follow up with your caregiver for further exams or immunizations as directed. You may need a tetanus shot if:  You cannot remember when you had your last tetanus shot.  You have never had a tetanus shot.  The injury broke your skin. If you get a tetanus shot, your arm may swell, get red, and feel warm to the touch. This is common and not a problem. If you need a tetanus shot and you choose not to have one, there is a rare chance of getting tetanus. Sickness from tetanus can be serious. SEEK MEDICAL CARE IF:  You notice warmth, redness, soreness, swelling, pus discharge, or a bad smell coming from the wound.  You have a red line on the skin coming from the wound.  You have a fever, chills, or a general ill feeling.  You have nausea or vomiting.  You have continued or worsening pain.  You have  trouble moving the injured part.  You have other questions or concerns. MAKE SURE YOU:  Understand these instructions.  Will watch your condition.  Will get help right away if you are not doing well or get worse. Document Released: 05/18/2011 Document Revised: 11/22/2011 Document Reviewed: 05/18/2011 [facility] Patient Information 2015 [place], [place]. This information is not intended to replace advice given to you by your health care provider. Make sure you discuss any questions you have with your health care provider.

## 2015-05-20 NOTE — ED Notes (Signed)
Pt was seen here yesterday for cat bite to right hand. Was given abx and Vicodin. Pt returns today for redness, swelling and uncontrolled pain. Pt is tearful.

## 2015-05-20 NOTE — ED Provider Notes (Signed)
Patient presented to the ER with redness and swelling secondary to Bite. Patient seen here yesterday after being bitten multiple times by a stray cat. Patient has developed redness and swelling of the hand.  Face to face Exam: HEENT - PERRLA Lungs - CTAB Heart - RRR, no M/R/G Abd - S/NT/ND Neuro - alert, oriented x3 Mild erythema and tenderness for aspect of distal fourth finger. Erythema over the dorsal aspect of hand. No fluctuance or significant induration.  Plan: Patient showing signs of infection despite being on Augmentin. No evidence of abscess formation. No concern for Tina synovitis based on examination. Patient to be administered IV antibiotic, return tomorrow for recheck.  Gilda Crease, MD 05/20/15 [phone]

## 2015-05-20 NOTE — ED Provider Notes (Signed)
CSN: [id number]     Arrival date & time 05/20/15  0844 History  This chart was scribed for non-physician practitioner, Lottie Mussel, PA-C, working with Gilda Crease, MD, by Ronney Lion, ED Scribe. This patient was seen in room TR07C/TR07C and the patient's care was started at 9:20 AM.    Chief Complaint  Patient presents with  . Follow-up   The history is provided by the patient. No language interpreter was used.    HPI Comments: [name] is a 39 y.o. female who presents to the Emergency Department for a follow-up of multiple stray cat bites to her right hand that occurred yesterday morning, complaining of worsening pain, redness, and swelling since being seen here at the ED yesterday. Patient was sent home with Augmentin and reports she had taken 2 doses -- 1 last night and 1 this morning. She was also sent home with Vicodin, and Zofran, both of which she had taken with no apparent relief. Patient also had the first of her rabies' vaccination series here yesterday.  Denies fever, chills. No swelling distal to the hand. No other complaints.   History reviewed. No pertinent past medical history. Past Surgical History  Procedure Laterality Date  . Wrist surgery    . Tonsillectomy     No family history on file. Social History  Substance Use Topics  . Smoking status: Former Games developer  . Smokeless tobacco: None  . Alcohol Use: None     Comment: occ   OB History    No data available     Review of Systems  Musculoskeletal: Positive for myalgias.  Skin: Positive for color change (redness) and wound (cat bite).    Allergies  Codeine  Home Medications   Prior to Admission medications   Medication Sig Start Date End Date Taking? Authorizing Provider  amoxicillin-clavulanate (AUGMENTIN) 875-125 MG per tablet Take 1 tablet by mouth 2 (two) times daily. One po bid x 7 days 05/19/15   Trixie Dredge, PA-C  HYDROcodone-acetaminophen (NORCO/VICODIN) 5-325 MG per tablet Take 1  tablet by mouth every 4 (four) hours as needed for moderate pain or severe pain. 05/19/15   Trixie Dredge, PA-C  ibuprofen (ADVIL,MOTRIN) 200 MG tablet Take 200 mg by mouth every 6 (six) hours as needed for pain.    Historical Provider, MD  indomethacin (INDOCIN) 50 MG capsule Take 1 capsule (50 mg total) by mouth 3 (three) times daily as needed for moderate pain (Take 3 times daily for 2 days then as needed). 05/10/15   Doreene Eland, MD  Multiple Vitamin (MULTIVITAMIN WITH MINERALS) TABS Take 1 tablet by mouth daily.    Historical Provider, MD  ondansetron (ZOFRAN) 4 MG tablet Take 1 tablet (4 mg total) by mouth every 8 (eight) hours as needed for nausea or vomiting. 05/19/15   Trixie Dredge, PA-C   BP 106/77 mmHg  Pulse 90  Temp(Src) 98 F (36.7 C) (Oral)  Resp 16  SpO2 100%  LMP 04/26/2015 (Exact Date) Physical Exam  Constitutional: She is oriented to person, place, and time. She appears well-developed and well-nourished. No distress.  HENT:  Head: Normocephalic and atraumatic.  Eyes: Conjunctivae and EOM are normal.  Neck: Neck supple. No tracheal deviation present.  Cardiovascular: Normal rate.   Pulmonary/Chest: Effort normal. No respiratory distress.  Musculoskeletal: Normal range of motion.  Several small puncture wounds to the right hand to the distal thumb and distal fourth finger. There is several small abrasions to the proximal fingers as  well. There is erythema to the dorsal hand with no significant induration or fluctuance. There is swelling to the distal fourth finger, tender to palpation. No drainage. See picture below.  Neurological: She is alert and oriented to person, place, and time.  Skin: Skin is warm and dry.  Psychiatric: She has a normal mood and affect. Her behavior is normal.  Nursing note and vitals reviewed.       ED Course  Procedures (including critical care time)  DIAGNOSTIC STUDIES: Oxygen Saturation is 100% on RA, normal by my interpretation.     COORDINATION OF CARE: 9:25 AM - Discussed treatment plan with pt at bedside which includes pain medication administered here, and consult with Dr. Blinda Leatherwood. Pt verbalized understanding and agreed to plan. She states her boyfriend is driving her today.  9:29 AM - Dr. Blinda Leatherwood in room to evaluate patient. He recommends 1 dose of IV antibiotics administered here today, and f/u here tomorrow.   Results for orders placed or performed during the hospital encounter of 05/20/15  CBC with Differential  Result Value Ref Range   WBC 14.9 (H) 4.0 - 10.5 K/uL   RBC 4.22 3.87 - 5.11 MIL/uL   Hemoglobin 13.8 12.0 - 15.0 g/dL   HCT 16.1 09.6 - 04.5 %   MCV 94.1 78.0 - 100.0 fL   MCH 32.7 26.0 - 34.0 pg   MCHC 34.8 30.0 - 36.0 g/dL   RDW 40.9 81.1 - 91.4 %   Platelets 211 150 - 400 K/uL   Neutrophils Relative % 80 (H) 43 - 77 %   Neutro Abs 11.9 (H) 1.7 - 7.7 K/uL   Lymphocytes Relative 12 12 - 46 %   Lymphs Abs 1.8 0.7 - 4.0 K/uL   Monocytes Relative 6 3 - 12 %   Monocytes Absolute 0.8 0.1 - 1.0 K/uL   Eosinophils Relative 2 0 - 5 %   Eosinophils Absolute 0.3 0.0 - 0.7 K/uL   Basophils Relative 0 0 - 1 %   Basophils Absolute 0.1 0.0 - 0.1 K/uL  I-Stat Chem 8, ED  Result Value Ref Range   Sodium 137 135 - 145 mmol/L   Potassium 3.7 3.5 - 5.1 mmol/L   Chloride 100 (L) 101 - 111 mmol/L   BUN 13 6 - 20 mg/dL   Creatinine, Ser 7.82 0.44 - 1.00 mg/dL   Glucose, Bld 956 (H) 65 - 99 mg/dL   Calcium, Ion 2.13 (L) 1.12 - 1.23 mmol/L   TCO2 24 0 - 100 mmol/L   Hemoglobin 15.0 12.0 - 15.0 g/dL   HCT 08.6 57.8 - 46.9 %   Imaging Review (from Yesterday) Dg Hand Complete Right  05/19/2015   CLINICAL DATA:  Acute cat bite today to right hand with pain. Initial encounter.  EXAM: RIGHT HAND - COMPLETE 3+ VIEW  COMPARISON:  None.  FINDINGS: There is no evidence of fracture or dislocation. There is no evidence of arthropathy or other focal bone abnormality. Soft tissues are unremarkable. No radiopaque foreign  bodies are identified.  IMPRESSION: Negative.   Electronically Signed   By: Harmon Pier M.D.   On: 05/19/2015 14:52   I have personally reviewed and evaluated these images and lab results as part of my medical decision-making.  MDM   Final diagnoses:  Cat bite of hand, right, subsequent encounter  Cellulitis of hand   Patient is here with worsening redness and swelling to the hand, cat bite yesterday. Concerning for early cellulitis. She was started on  Augmentin yesterday. Discussed with Dr. Blinda Leatherwood who has seen patient does well, I this time I do not think patient needs to be admitted. Unable to early for Augmentin to start working, she has had 2 doses so far. Will give a dose of Zosyn in the emergency department. CBC and metabolic panel obtained. We will bring patient back tomorrow for recheck. Return precautions discussed.  Filed Vitals:   05/20/15 0904 05/20/15 1104  BP: 106/77 109/72  Pulse: 90 72  Temp: 98 F (36.7 C) 98 F (36.7 C)  TempSrc: Oral Oral  Resp: 16 16  SpO2: 100% 95%    I personally performed the services described in this documentation, which was scribed in my presence. The recorded information has been reviewed and is accurate.   Jaynie Crumble, PA-C 05/20/15 1115  Gilda Crease, MD 05/20/15 1239

## 2015-05-20 NOTE — ED Notes (Signed)
Med requested from pharmacy.

## 2015-05-21 ENCOUNTER — Emergency Department (HOSPITAL_COMMUNITY)
Admission: EM | Admit: 2015-05-21 | Discharge: 2015-05-21 | Disposition: A | Discharge status: Home / Self Care | Payer: BLUE CROSS/BLUE SHIELD | Attending: Emergency Medicine | Admitting: Emergency Medicine

## 2015-05-21 ENCOUNTER — Encounter (HOSPITAL_COMMUNITY): Payer: Self-pay | Admitting: Family Medicine

## 2015-05-21 DIAGNOSIS — Z79899 Other long term (current) drug therapy: Secondary | ICD-10-CM | POA: Diagnosis not present

## 2015-05-21 DIAGNOSIS — Z5189 Encounter for other specified aftercare: Secondary | ICD-10-CM

## 2015-05-21 DIAGNOSIS — Z4800 Encounter for change or removal of nonsurgical wound dressing: Secondary | ICD-10-CM | POA: Insufficient documentation

## 2015-05-21 DIAGNOSIS — Z87891 Personal history of nicotine dependence: Secondary | ICD-10-CM | POA: Diagnosis not present

## 2015-05-21 MED ORDER — OXYCODONE-ACETAMINOPHEN 5-325 MG PO TABS
1.0000 | ORAL_TABLET | Freq: Once | ORAL | Status: AC
Start: 2015-05-21 — End: 2015-05-21
  Administered 2015-05-21: 1 via ORAL

## 2015-05-21 MED ORDER — OXYCODONE-ACETAMINOPHEN 5-325 MG PO TABS
ORAL_TABLET | ORAL | Status: AC
  Filled 2015-05-21: qty 1

## 2015-05-21 NOTE — ED Notes (Signed)
Pt here for recheck of right hand. Pt was seen here yesterday and told to come back. sts redness has not spread. sts a lot of pain.

## 2015-05-21 NOTE — ED Provider Notes (Signed)
CSN: [id number]     Arrival date & time 05/21/15  1418 History  This chart was scribed for non-physician practitioner, Eyvonne Mechanic, PA-C working with Azalia Bilis, MD by Gwenyth Ober, ED scribe. This patient was seen in room TR07C/TR07C and the patient's care was started at 3:11 PM     Chief Complaint  Patient presents with  . Wound Check   The history is provided by the patient. No language interpreter was used.   HPI Comments: [name] is a 39 y.o. female who presents to the Emergency Department complaining of a gradually improving area of redness and swelling to the dorsal aspect of her right forearm, with associated moderate pain, that started 2 days ago after she was bit by a stray cat. She states nausea that started 2 days ago as an associated symptom. Pt was initially seen in the ED on 9/5 after she was bit by a stray cat and was started on Augmentin. She also received the first rabies vaccine and the rabies immune globulin at that visit. Pt was seen again yesterday for worsening redness and was given IV Ampicillin-Sulbactam. Pt denies numbness, fever, nausea, dizziness, and vomiting.  History reviewed. No pertinent past medical history. Past Surgical History  Procedure Laterality Date  . Wrist surgery    . Tonsillectomy     History reviewed. No pertinent family history. Social History  Substance Use Topics  . Smoking status: Former Games developer  . Smokeless tobacco: None  . Alcohol Use: None     Comment: occ   OB History    No data available     Review of Systems  All other systems reviewed and are negative.    Allergies  Codeine  Home Medications   Prior to Admission medications   Medication Sig Start Date End Date Taking? Authorizing Provider  amoxicillin-clavulanate (AUGMENTIN) 875-125 MG per tablet Take 1 tablet by mouth 2 (two) times daily. One po bid x 7 days 05/19/15   Trixie Dredge, PA-C  HYDROcodone-acetaminophen (NORCO/VICODIN) 5-325 MG per tablet Take 1  tablet by mouth every 4 (four) hours as needed for moderate pain or severe pain. 05/19/15   Trixie Dredge, PA-C  ibuprofen (ADVIL,MOTRIN) 200 MG tablet Take 200 mg by mouth every 6 (six) hours as needed for pain.    Historical Provider, MD  indomethacin (INDOCIN) 50 MG capsule Take 1 capsule (50 mg total) by mouth 3 (three) times daily as needed for moderate pain (Take 3 times daily for 2 days then as needed). 05/10/15   Doreene Eland, MD  Multiple Vitamin (MULTIVITAMIN WITH MINERALS) TABS Take 1 tablet by mouth daily.    Historical Provider, MD  ondansetron (ZOFRAN) 4 MG tablet Take 1 tablet (4 mg total) by mouth every 8 (eight) hours as needed for nausea or vomiting. 05/19/15   Trixie Dredge, PA-C   BP 110/58 mmHg  Pulse 85  Temp(Src) 98.2 F (36.8 C) (Oral)  Resp 18  SpO2 98%  LMP 04/26/2015 (Exact Date)   Physical Exam  Constitutional: She appears well-developed and well-nourished. No distress.  HENT:  Head: Normocephalic and atraumatic.  Eyes: Conjunctivae and EOM are normal.  Neck: Neck supple. No tracheal deviation present.  Cardiovascular: Normal rate.   Pulmonary/Chest: Effort normal. No respiratory distress.  Musculoskeletal:  Cap refill less than 3 s  Neurological:  Sensation intact  Skin: Skin is warm and dry.  Multiple puncture wounds to the 1st, 2nd, 3rd and 4th digits to the right hand  Minimal  erythema surrounding puncture Joints minimally tender No swelling  With active ROM  Psychiatric: She has a normal mood and affect. Her behavior is normal.  Nursing note and vitals reviewed.  First photo 05/20/2015, second photo 05/21/2015           ED Course  Procedures   DIAGNOSTIC STUDIES: Oxygen Saturation is 95% on RA, adequate by my interpretation.    COORDINATION OF CARE: 3:20 PM Discussed treatment plan with pt which includes continuation of rabies vaccine series. Pt to return with worsening symptoms. She agreed to plan.   Labs Review Labs Reviewed - No  data to display  Imaging Review No results found.   EKG Interpretation None      MDM   Final diagnoses:  Visit for wound check   Labs:  None indicated  Imaging:   Consults:   Therapeutics:   Discharge Meds:   Assessment/Plan: Patient presents for wound check after being bit by a stray cat. Patient received rabies vaccination initially, was seen yesterday with IV antibiotics, continue Augmentin. She reports improvement in her symptoms, she is afebrile, nontoxic, no significant findings on exam. Review of previous images shows significant improvement in swelling and redness to the infected sites. Patient will be discharged home with advice to continue using the Augmentin, monitor for new or worsening signs or symptoms return immediately if any present. She scheduled for the next rabies vaccination tomorrow, she is encouraged to make this appointment. If redness worsens, she is to return immediately, if improving follow-up with primary care in 2 days for reevaluation. Pt to continue Augmentin and rabies vaccination series.    I personally performed the services described in this documentation, which was scribed in my presence. The recorded information has been reviewed and is accurate.   Eyvonne Mechanic, PA-C 05/21/15 1616  Marily Memos, MD 05/21/15 [phone]

## 2015-05-21 NOTE — Discharge Instructions (Signed)
Please continue to monitor for worsening signs of infection, finished present please return immediately to the emergency room for further evaluation and management. Please continue with your previously scheduled rabies vaccinations. Please continue using oral antibiotics. Please follow-up with her primary care provider in 1-2 days for reevaluation, sooner as needed.

## 2015-05-22 ENCOUNTER — Encounter (HOSPITAL_COMMUNITY): Payer: Self-pay | Admitting: Emergency Medicine

## 2015-05-22 ENCOUNTER — Emergency Department (HOSPITAL_COMMUNITY)
Admission: EM | Admit: 2015-05-22 | Discharge: 2015-05-22 | Disposition: A | Discharge status: Home / Self Care | Payer: BLUE CROSS/BLUE SHIELD | Source: Home / Self Care | Attending: Family Medicine | Admitting: Family Medicine

## 2015-05-22 DIAGNOSIS — W5501XS Bitten by cat, sequela: Secondary | ICD-10-CM | POA: Diagnosis not present

## 2015-05-22 DIAGNOSIS — Z5189 Encounter for other specified aftercare: Secondary | ICD-10-CM | POA: Diagnosis not present

## 2015-05-22 DIAGNOSIS — Z23 Encounter for immunization: Secondary | ICD-10-CM | POA: Diagnosis not present

## 2015-05-22 DIAGNOSIS — M79641 Pain in right hand: Secondary | ICD-10-CM

## 2015-05-22 MED ORDER — RABIES VACCINE, PCEC IM SUSR
INTRAMUSCULAR | Status: AC
  Filled 2015-05-22: qty 1

## 2015-05-22 MED ORDER — HYDROCODONE-ACETAMINOPHEN 5-325 MG PO TABS: 1.0000 | ORAL_TABLET | ORAL | Status: DC | PRN

## 2015-05-22 MED ORDER — RABIES VACCINE, PCEC IM SUSR
1.0000 mL | Freq: Once | INTRAMUSCULAR | Status: AC
  Administered 2015-05-22: 1 mL via INTRAMUSCULAR

## 2015-05-22 NOTE — ED Provider Notes (Signed)
CSN: [id number]     Arrival date & time 05/22/15  1303 History   First MD Initiated Contact with Patient 05/22/15 1344     Chief Complaint  Patient presents with  . Rabies Injection   (Consider location/radiation/quality/duration/timing/severity/associated sxs/prior Treatment) HPI Comments: 39 year old female who was bitten by a stray cat approximate 4 days ago received bite marks and puncture wounds to the right hand and digits. She developed overlying swelling and erythema. She was seen in the emergency department and was treated with Augmentin and initiation of rabies vaccination series. She was also treated with Norco 5 mg #15. She has been seen the fifth, sixth and seventh of September in the emergency department. Yesterday she received IV ABX.  I have reviewed the photographs of the resolving swelling and erythema of the wounds. She continues to improve today when comparing the photographs and exam today. She is anxious, nervous and tremulous stating that she is having severe pain and needs something else for it. She has taken all of her Norco tablets.   History reviewed. No pertinent past medical history. Past Surgical History  Procedure Laterality Date  . Wrist surgery    . Tonsillectomy     No family history on file. Social History  Substance Use Topics  . Smoking status: Former Games developer  . Smokeless tobacco: None  . Alcohol Use: None     Comment: occ   OB History    No data available     Review of Systems  Constitutional: Negative for fever and activity change.  Respiratory: Negative.   Gastrointestinal: Negative.   Musculoskeletal: Positive for joint swelling.  Skin: Positive for color change.  Neurological: Negative.   All other systems reviewed and are negative.   Allergies  Codeine  Home Medications   Prior to Admission medications   Medication Sig Start Date End Date Taking? Authorizing Provider  amoxicillin-clavulanate (AUGMENTIN) 875-125 MG per tablet  Take 1 tablet by mouth 2 (two) times daily. One po bid x 7 days 05/19/15   Trixie Dredge, PA-C  HYDROcodone-acetaminophen (NORCO/VICODIN) 5-325 MG per tablet Take 1 tablet by mouth every 4 (four) hours as needed. 05/22/15   Hayden Rasmussen, NP  ibuprofen (ADVIL,MOTRIN) 200 MG tablet Take 200 mg by mouth every 6 (six) hours as needed for pain.    Historical Provider, MD  indomethacin (INDOCIN) 50 MG capsule Take 1 capsule (50 mg total) by mouth 3 (three) times daily as needed for moderate pain (Take 3 times daily for 2 days then as needed). 05/10/15   Doreene Eland, MD  Multiple Vitamin (MULTIVITAMIN WITH MINERALS) TABS Take 1 tablet by mouth daily.    Historical Provider, MD  ondansetron (ZOFRAN) 4 MG tablet Take 1 tablet (4 mg total) by mouth every 8 (eight) hours as needed for nausea or vomiting. 05/19/15   Trixie Dredge, PA-C   Meds Ordered and Administered this Visit   Medications  rabies vaccine (RABAVERT) injection 1 mL (1 mL Intramuscular Given 05/22/15 1338)    BP 102/70 mmHg  Pulse 98  Temp(Src) 98.4 F (36.9 C) (Oral)  Resp 16  SpO2 98%  LMP 04/26/2015 (Exact Date) No data found.   Physical Exam  Constitutional: She is oriented to person, place, and time. She appears well-developed and well-nourished. No distress.  Pulmonary/Chest: Effort normal. No respiratory distress.  Musculoskeletal: She exhibits tenderness.  Minor, light erythema to the digits and hand similar to or less than pictures taking yesterday. There are no signs of infection, purulence,  exudates or increased redness. She does complain of decrease in motion of her index finger. Distal N/V, M/S intact although, index finger painful to flex.  Neurological: She is alert and oriented to person, place, and time. She exhibits normal muscle tone.  Skin: Skin is warm and dry.  Psychiatric: She has a normal mood and affect.  Nursing note and vitals reviewed.   ED Course  Procedures (including critical care time)  Labs Review Labs  Reviewed - No data to display  Imaging Review No results found.   Visual Acuity Review  Right Eye Distance:   Left Eye Distance:   Bilateral Distance:    Right Eye Near:   Left Eye Near:    Bilateral Near:         MDM   1. Encounter for post-traumatic wound check   2. Cat bite, sequela   3. Hand pain, right    norco 5 mg #15 Wound care instructions Cont taking Augmentin F/U with PCP as needed    Hayden Rasmussen, NP 05/22/15 1406

## 2015-05-22 NOTE — ED Notes (Signed)
Patient complains of continued pain and tylenol and ibuprofen not helping.

## 2015-05-22 NOTE — Discharge Instructions (Signed)
Wound Check °Your wound appears healthy today. Your wound will heal gradually over time. Eventually a scar will form that will fade with time. °FACTORS THAT AFFECT SCAR FORMATION: °· People differ in the severity in which they scar.  °· Scar severity varies according to location, size, and the traits you inherited from your parents (genetic predisposition). °· Irritation to the wound from infection, rubbing, or chemical exposure will increase the amount of scar formation. °HOME CARE INSTRUCTIONS  °· If you were given a dressing, you should change it at least once a day or as instructed by your caregiver. If the bandage sticks, soak it off with a solution of hydrogen peroxide. °· If the bandage becomes wet, dirty, or develops a bad smell, change it as soon as possible. °· Look for signs of infection. °· Only take over-the-counter or prescription medicines for pain, discomfort, or fever as directed by your caregiver. °SEEK IMMEDIATE MEDICAL CARE IF:  °· You have redness, swelling, or increasing pain in the wound. °· You notice pus coming from the wound. °· You have a fever. °· You notice a bad smell coming from the wound or dressing. °Document Released: 06/05/2004 Document Revised: 11/22/2011 Document Reviewed: 08/30/2005 °[employer]® Patient Information ©2015 [employer]. This information is not intended to replace advice given to you by your health care provider. Make sure you discuss any questions you have with your health care provider. ° °Wound Infection °A wound infection happens when a type of germ (bacteria) starts growing in the wound. In some cases, this can cause the wound to break open. If cared for properly, the infected wound will heal from the inside to the outside. Wound infections need treatment. °CAUSES °An infection is caused by bacteria growing in the wound.  °SYMPTOMS  °· Increase in redness, swelling, or pain at the wound site. °· Increase in drainage at the wound site. °· Wound or bandage  (dressing) starts to smell bad. °· Fever. °· Feeling tired or fatigued. °· Pus draining from the wound. °TREATMENT  °Your health care provider will prescribe antibiotic medicine. The wound infection should improve within 24 to 48 hours. Any redness around the wound should stop spreading and the wound should be less painful.  °HOME CARE INSTRUCTIONS  °· Only take over-the-counter or prescription medicines for pain, discomfort, or fever as directed by your health care provider. °· Take your antibiotics as directed. Finish them even if you start to feel better. °· Gently wash the area with mild soap and water 2 times a day, or as directed. Rinse off the soap. Pat the area dry with a clean towel. Do not rub the wound. This may cause bleeding. °· Follow your health care provider's instructions for how often you need to change the dressing. °· Apply ointment and a dressing to the wound as directed. °· If the dressing sticks, moisten it with soapy water and gently remove it. °· Change the bandage right away if it becomes wet, dirty, or develops a bad smell. °· Take showers. Do not take tub baths, swim, or do anything that may soak the wound until it is healed. °· Avoid exercises that make you sweat heavily. °· Use anti-itch medicine as directed by your health care provider. The wound may itch when it is healing. Do not pick or scratch at the wound. °· Follow up with your health care provider to get your wound rechecked as directed. °SEEK MEDICAL CARE IF: °· You have an increase in swelling, pain, or redness around   the wound.  You have an increase in the amount of pus coming from the wound.  There is a bad smell coming from the wound.  More of the wound breaks open.  You have a fever. MAKE SURE YOU:   Understand these instructions.  Will watch your condition.  Will get help right away if you are not doing well or get worse. Document Released: 05/29/2003 Document Revised: 09/04/2013 Document Reviewed:  01/03/2011 [facility] Patient Information 2015 [place], [place]. This information is not intended to replace advice given to you by your health care provider. Make sure you discuss any questions you have with your health care provider.

## 2015-05-22 NOTE — ED Notes (Signed)
Hayden Rasmussen, NP notified of patient having pain and wanting to see NP.

## 2015-05-22 NOTE — ED Notes (Signed)
Rabies injection to be given today-day #3 in series.   Incident occurred 9/5-cat bite.  Seen x 2 in ed for recheck of wounds

## 2015-05-26 ENCOUNTER — Emergency Department (HOSPITAL_COMMUNITY)
Admission: EM | Admit: 2015-05-26 | Discharge: 2015-05-26 | Disposition: A | Discharge status: Home / Self Care | Payer: BLUE CROSS/BLUE SHIELD | Source: Home / Self Care

## 2015-05-26 ENCOUNTER — Encounter (HOSPITAL_COMMUNITY): Payer: Self-pay | Admitting: Emergency Medicine

## 2015-05-26 DIAGNOSIS — Z203 Contact with and (suspected) exposure to rabies: Secondary | ICD-10-CM

## 2015-05-26 MED ORDER — RABIES VACCINE, PCEC IM SUSR
INTRAMUSCULAR | Status: AC
  Filled 2015-05-26: qty 1

## 2015-05-26 MED ORDER — RABIES VACCINE, PCEC IM SUSR
1.0000 mL | Freq: Once | INTRAMUSCULAR | Status: AC
  Administered 2015-05-26: 1 mL via INTRAMUSCULAR

## 2015-05-26 NOTE — Discharge Instructions (Signed)
See you 06/02/15 for your last rabies injection    Rabies Vaccine What You Need to Know WHAT IS RABIES?  Rabies is a serious disease. It is caused by a virus.  Rabies is mainly a disease of animals. Humans get rabies when they are bitten by infected animals.  At first there might not be any symptoms. But weeks, or even years after a bite, rabies can cause pain, fatigue, headaches, fever, and irritability. These are followed by seizures, hallucinations, and paralysis. Human rabies is almost always fatal.  Wild animals, especially bats, are the most common source of human rabies infection in the [place]. Skunks, raccoons, dogs, cats, coyotes, foxes, and other mammals can also transmit the disease.  Human rabies is rare in the [place]. There have been only 55 cases diagnosed since 1990. However, between 16,000 and 39,000 people are vaccinated each year as a precaution after animal bites. Also, rabies is far more common in other parts of the world, with about 40,000 to 70,000 rabies-related deaths worldwide each year. Bites from unvaccinated dogs cause most of these cases. Rabies vaccine can prevent rabies. RABIES VACCINE  Rabies vaccine is given to people at high risk of rabies to protect them if they are exposed. It can also prevent the disease if it is given to a person after they have been exposed.  Rabies vaccine is made from killed rabies virus. It cannot cause rabies. WHO SHOULD GET RABIES VACCINE AND WHEN? Preventive Vaccination (No Exposure)  People at high risk of exposure to rabies, such as veterinarians, Educational psychologist, rabies laboratory workers, spelunkers, and rabies biologics production workers should be offered rabies vaccine.  The vaccine should also be considered for:  People whose activities bring them into frequent contact with rabies virus or with possibly rabid animals.  International travelers who are likely to come in contact with animals in parts of the  world where rabies is common.  The pre-exposure schedule for rabies vaccination is 3 doses, given at the following times:  Dose 1: As appropriate.  Dose 2: 7 days after Dose 1.  Dose 3: 21 days or 28 days after Dose 1.  For laboratory workers and others who may be repeatedly exposed to rabies virus, periodic testing for immunity is recommended and booster doses should be given as needed. (Testing or booster doses are not recommended for travelers). Ask your doctor for details. Vaccination After an Exposure Anyone who has been bitten by an animal, or who otherwise may have been exposed to rabies, should clean the wound and see a doctor immediately. The doctor will determine if they need to be vaccinated. A person who is exposed and has never been vaccinated against rabies should get 4 doses of rabies vaccine: one dose right away and additional doses on the 3rd, 7th, and 14th days. They should also get another shot called Rabies Immune Globulin at the same time as the first dose.  A person who has been previously vaccinated should get 2 doses of rabies vaccine: one right away and another on the 3rd day. Rabies Immune Globulin is not needed. TELL YOUR DOCTOR IF: Talk with a doctor before getting rabies vaccine if you:  Ever had a serious (life-threatening) allergic reaction to a previous dose of rabies vaccine or to any component of the vaccine; tell your doctor if you have any severe allergies.  Have a weakened immune system because of:  HIV, AIDS, or another disease that affects the immune system.  Treatment with  drugs that affect the immune system, such as steroids.  Cancer or cancer treatment with radiation or drugs. If you have a minor illness, such as a cold, you can be vaccinated. If you are moderately or severely ill, you should probably wait until you recover before getting a routine (non-exposure) dose of rabies vaccine. If you have been exposed to rabies virus, you should get the  vaccine regardless of any other illnesses you may have. WHAT ARE THE RISKS FROM RABIES VACCINE? A vaccine, like any medicine, is capable of causing serious problems, such as severe allergic reactions. The risk of a vaccine causing serious harm, or death, is extremely small. Serious problems from rabies vaccine are very rare.  Mild problems:  Soreness, redness, swelling, or itching where the shot was given (30% to 74%).  Headache, nausea, abdominal pain, muscle aches, or dizziness (5% to 40%). Moderate problems:  Hives, pain in the joints, or fever (about 6% of booster doses).  Other nervous system disorders, such as Guillain-Barr Syndrome (GBS), have been reported after rabies vaccine, but this happens so rarely that it is not known whether they are related to the vaccine. Note: Several brands of rabies vaccine are available in the [place], and reactions may vary between brands. Your provider can give you more information about a particular brand. WHAT IF THERE IS A SERIOUS REACTION? What should I look for? Look for anything that concerns you, such as signs of a severe allergic reaction, very high fever, or behavior changes.  Signs of a severe allergic reaction can include hives, swelling of the face and throat, difficulty breathing, a fast heartbeat, dizziness, and weakness. These would start a few minutes to a few hours after the vaccination. What should I do?  If you think it is a severe allergic reaction or other emergency that cannot wait, call 911 or get the person to the nearest hospital. Otherwise, call your doctor.  Afterward, the reaction should be reported to the Vaccine Adverse Event Reporting System (VAERS). Your doctor might file this report, or you can do it yourself through the VAERS website at www.vaers.SamedayNews.es or by calling 680-504-2188. VAERS is only for reporting reactions. They do not give medical advice. HOW CAN I LEARN MORE?  Ask your doctor.  Call your  local or state health department.  Contact the Centers for Disease Control and Prevention (CDC):  Visit the CDC rabies website at [place].no CDC Rabies Vaccine VIS (06/18/08) Document Released: 06/27/2006 Document Revised: 08/16/2012 Document Reviewed: 12/20/2012 [facility] Patient Information 2015 [place]. This information is not intended to replace advice given to you by your health care provider. Make sure you discuss any questions you have with your health care provider.

## 2015-06-02 ENCOUNTER — Emergency Department (HOSPITAL_COMMUNITY)
Admission: EM | Admit: 2015-06-02 | Discharge: 2015-06-02 | Disposition: A | Discharge status: Home / Self Care | Payer: BLUE CROSS/BLUE SHIELD | Source: Home / Self Care

## 2015-06-02 ENCOUNTER — Encounter (HOSPITAL_COMMUNITY): Payer: Self-pay | Admitting: Emergency Medicine

## 2015-06-02 MED ORDER — RABIES VACCINE, PCEC IM SUSR
INTRAMUSCULAR | Status: AC
  Filled 2015-06-02: qty 1

## 2015-06-02 MED ORDER — RABIES VACCINE, PCEC IM SUSR
1.0000 mL | Freq: Once | INTRAMUSCULAR | Status: AC
  Administered 2015-06-02: 1 mL via INTRAMUSCULAR

## 2015-06-02 NOTE — ED Notes (Signed)
Here for last rabies injection

## 2015-09-10 ENCOUNTER — Other Ambulatory Visit: Payer: Self-pay | Admitting: Obstetrics and Gynecology

## 2015-09-10 DIAGNOSIS — Z1231 Encounter for screening mammogram for malignant neoplasm of breast: Secondary | ICD-10-CM

## 2015-09-26 ENCOUNTER — Ambulatory Visit
Admission: RE | Admit: 2015-09-26 | Discharge: 2015-09-26 | Disposition: A | Discharge status: Home / Self Care | Payer: Managed Care, Other (non HMO) | Source: Ambulatory Visit | Attending: Obstetrics and Gynecology | Admitting: Obstetrics and Gynecology

## 2015-09-26 DIAGNOSIS — R921 Mammographic calcification found on diagnostic imaging of breast: Secondary | ICD-10-CM | POA: Diagnosis not present

## 2015-09-26 DIAGNOSIS — Z1231 Encounter for screening mammogram for malignant neoplasm of breast: Secondary | ICD-10-CM | POA: Insufficient documentation

## 2015-09-26 DIAGNOSIS — N6489 Other specified disorders of breast: Secondary | ICD-10-CM | POA: Insufficient documentation

## 2015-09-30 ENCOUNTER — Other Ambulatory Visit: Payer: Self-pay | Admitting: Obstetrics and Gynecology

## 2015-09-30 DIAGNOSIS — R921 Mammographic calcification found on diagnostic imaging of breast: Secondary | ICD-10-CM

## 2015-09-30 DIAGNOSIS — R928 Other abnormal and inconclusive findings on diagnostic imaging of breast: Secondary | ICD-10-CM

## 2015-10-10 ENCOUNTER — Ambulatory Visit
Admission: RE | Admit: 2015-10-10 | Discharge: 2015-10-10 | Disposition: A | Discharge status: Home / Self Care | Payer: Managed Care, Other (non HMO) | Source: Ambulatory Visit | Attending: Obstetrics and Gynecology | Admitting: Obstetrics and Gynecology

## 2015-10-10 DIAGNOSIS — R921 Mammographic calcification found on diagnostic imaging of breast: Secondary | ICD-10-CM

## 2015-10-10 DIAGNOSIS — R928 Other abnormal and inconclusive findings on diagnostic imaging of breast: Secondary | ICD-10-CM

## 2015-10-14 ENCOUNTER — Other Ambulatory Visit: Payer: Self-pay | Admitting: Obstetrics and Gynecology

## 2015-10-14 DIAGNOSIS — R921 Mammographic calcification found on diagnostic imaging of breast: Secondary | ICD-10-CM

## 2015-10-14 DIAGNOSIS — N6489 Other specified disorders of breast: Secondary | ICD-10-CM

## 2015-11-11 DIAGNOSIS — R7989 Other specified abnormal findings of blood chemistry: Secondary | ICD-10-CM | POA: Insufficient documentation

## 2016-04-08 ENCOUNTER — Ambulatory Visit: Payer: Managed Care, Other (non HMO)

## 2016-04-08 ENCOUNTER — Other Ambulatory Visit: Payer: Managed Care, Other (non HMO)

## 2016-04-16 ENCOUNTER — Ambulatory Visit
Admission: RE | Admit: 2016-04-16 | Discharge: 2016-04-16 | Disposition: A | Discharge status: Home / Self Care | Payer: Managed Care, Other (non HMO) | Source: Ambulatory Visit | Attending: Obstetrics and Gynecology | Admitting: Obstetrics and Gynecology

## 2016-04-16 DIAGNOSIS — N6489 Other specified disorders of breast: Secondary | ICD-10-CM

## 2016-04-16 DIAGNOSIS — R921 Mammographic calcification found on diagnostic imaging of breast: Secondary | ICD-10-CM

## 2016-07-27 DIAGNOSIS — H9313 Tinnitus, bilateral: Secondary | ICD-10-CM | POA: Insufficient documentation

## 2016-07-30 ENCOUNTER — Other Ambulatory Visit: Payer: Self-pay | Admitting: Otolaryngology

## 2016-07-30 DIAGNOSIS — H9313 Tinnitus, bilateral: Secondary | ICD-10-CM

## 2016-07-30 DIAGNOSIS — E041 Nontoxic single thyroid nodule: Secondary | ICD-10-CM

## 2016-08-13 ENCOUNTER — Ambulatory Visit
Admission: RE | Admit: 2016-08-13 | Discharge: 2016-08-13 | Disposition: A | Discharge status: Home / Self Care | Payer: Managed Care, Other (non HMO) | Source: Ambulatory Visit | Attending: Otolaryngology | Admitting: Otolaryngology

## 2016-08-13 DIAGNOSIS — E042 Nontoxic multinodular goiter: Secondary | ICD-10-CM | POA: Diagnosis not present

## 2016-08-13 DIAGNOSIS — H9313 Tinnitus, bilateral: Secondary | ICD-10-CM | POA: Diagnosis not present

## 2016-08-13 DIAGNOSIS — E041 Nontoxic single thyroid nodule: Secondary | ICD-10-CM

## 2016-08-13 DIAGNOSIS — Z9889 Other specified postprocedural states: Secondary | ICD-10-CM

## 2016-08-13 HISTORY — DX: Other specified postprocedural states: Z98.890

## 2016-08-13 MED ORDER — GADOBENATE DIMEGLUMINE 529 MG/ML IV SOLN
15.0000 mL | Freq: Once | INTRAVENOUS | Status: AC | PRN
  Administered 2016-08-13: 14 mL via INTRAVENOUS

## 2016-08-23 DIAGNOSIS — R9082 White matter disease, unspecified: Secondary | ICD-10-CM | POA: Insufficient documentation

## 2016-08-23 DIAGNOSIS — R9089 Other abnormal findings on diagnostic imaging of central nervous system: Secondary | ICD-10-CM | POA: Insufficient documentation

## 2016-08-24 ENCOUNTER — Other Ambulatory Visit: Payer: Self-pay | Admitting: Neurology

## 2016-08-24 DIAGNOSIS — H9313 Tinnitus, bilateral: Secondary | ICD-10-CM

## 2016-08-24 DIAGNOSIS — R9089 Other abnormal findings on diagnostic imaging of central nervous system: Secondary | ICD-10-CM

## 2016-09-02 ENCOUNTER — Ambulatory Visit
Admission: RE | Admit: 2016-09-02 | Discharge: 2016-09-02 | Disposition: A | Discharge status: Home / Self Care | Payer: Managed Care, Other (non HMO) | Source: Ambulatory Visit | Attending: Neurology | Admitting: Neurology

## 2016-09-02 ENCOUNTER — Other Ambulatory Visit: Payer: Self-pay | Admitting: Diagnostic Radiology

## 2016-09-02 DIAGNOSIS — R9089 Other abnormal findings on diagnostic imaging of central nervous system: Secondary | ICD-10-CM | POA: Insufficient documentation

## 2016-09-02 DIAGNOSIS — H9313 Tinnitus, bilateral: Secondary | ICD-10-CM | POA: Diagnosis not present

## 2016-09-02 LAB — GLUCOSE, CSF: GLUCOSE CSF: 60 mg/dL (ref 40–70)

## 2016-09-02 LAB — CSF CELL COUNT WITH DIFFERENTIAL
EOS CSF: 0 %
Lymphs, CSF: 82 %
MONOCYTE-MACROPHAGE-SPINAL FLUID: 18 %
RBC Count, CSF: 5 /mm3 — ABNORMAL HIGH (ref 0–3)
SEGMENTED NEUTROPHILS-CSF: 0 %
Tube #: 3
WBC, CSF: 14 /mm3 (ref 0–5)

## 2016-09-02 LAB — ALBUMIN: ALBUMIN: 4.3 g/dL (ref 3.5–5.0)

## 2016-09-02 LAB — PREGNANCY, URINE: Preg Test, Ur: NEGATIVE

## 2016-09-02 LAB — PROTEIN, CSF: Total  Protein, CSF: 36 mg/dL (ref 15–45)

## 2016-09-02 MED ORDER — ACETAMINOPHEN 500 MG PO TABS
1000.0000 mg | ORAL_TABLET | Freq: Four times a day (QID) | ORAL | Status: DC | PRN
  Filled 2016-09-02: qty 2

## 2016-09-02 NOTE — Progress Notes (Unsigned)
Pt. Stable after LP--VSS,Back stable.D/C instructions given--f/u with her MD.

## 2016-09-02 NOTE — Discharge Instructions (Signed)
Lumbar Puncture, Care After °Refer to this sheet in the next few weeks. These instructions provide you with information on caring for yourself after your procedure. Your health care provider may also give you more specific instructions. Your treatment has been planned according to current medical practices, but problems sometimes occur. Call your health care provider if you have any problems or questions after your procedure. °What can I expect after the procedure? °After your procedure, it is typical to have the following sensations: °· Mild discomfort or pain at the insertion site. °· Mild headache that is relieved with pain medicines. ° °Follow these instructions at home: ° °· Avoid lifting anything heavier than 10 lb (4.5 kg) for at least 12 hours after the procedure. °· Drink enough fluids to keep your urine clear or pale yellow. °Contact a health care provider if: °· You have fever or chills. °· You have nausea or vomiting. °· You have a headache that lasts for more than 2 days. °Get help right away if: °· You have any numbness or tingling in your legs. °· You are unable to control your bowel or bladder. °· You have bleeding or swelling in your back at the insertion site. °· You are dizzy or faint. °This information is not intended to replace advice given to you by your health care provider. Make sure you discuss any questions you have with your health care provider. °Document Released: 09/04/2013 Document Revised: 02/05/2016 Document Reviewed: 05/08/2013 °Elsevier Interactive Patient Education © 2017 [employer]. ° °

## 2016-09-02 NOTE — OR Nursing (Addendum)
Dr Malvin Johns office notified of critical value results from Lumbar puncture, Message left with [facility]

## 2016-09-03 LAB — IGG 4: IGG 4: 12 mg/dL (ref 2–96)

## 2016-09-06 LAB — CSF CULTURE W GRAM STAIN

## 2016-09-06 LAB — CSF CULTURE: CULTURE: NO GROWTH

## 2016-09-08 LAB — OLIGOCLONAL BANDS, CSF + SERM

## 2016-09-09 LAB — HSV(HERPES SMPLX VRS)ABS-I+II(IGG)-CSF: HSV Type I/II Ab, IgG CSF: <0.34 IV (ref <=0.89)

## 2016-11-16 ENCOUNTER — Ambulatory Visit: Payer: Self-pay

## 2016-11-16 ENCOUNTER — Encounter: Payer: Self-pay | Admitting: Sports Medicine

## 2016-11-16 ENCOUNTER — Ambulatory Visit (INDEPENDENT_AMBULATORY_CARE_PROVIDER_SITE_OTHER): Payer: 59 | Admitting: Sports Medicine

## 2016-11-16 VITALS — BP 110/74 | HR 84 | Ht 63.0 in | Wt 180.2 lb

## 2016-11-16 DIAGNOSIS — M79645 Pain in left finger(s): Secondary | ICD-10-CM

## 2016-11-16 DIAGNOSIS — G5600 Carpal tunnel syndrome, unspecified upper limb: Secondary | ICD-10-CM | POA: Insufficient documentation

## 2016-11-16 DIAGNOSIS — G5602 Carpal tunnel syndrome, left upper limb: Secondary | ICD-10-CM | POA: Diagnosis not present

## 2016-11-16 MED ORDER — DICLOFENAC SODIUM 2 % TD SOLN: 1.0000 "application " | Freq: Two times a day (BID) | TRANSDERMAL | 2 refills | Status: DC

## 2016-11-16 NOTE — Assessment & Plan Note (Signed)
Patient has a bifid and flattened median nerve.  This is likely was contributing to the discomfort she has especially upon first awakening.  We will have her begin with carpal tunnel stretches as well as with a cockup wrist splint at night.  Topical Pennsaid.  If any lack of improvement will consider ultrasound-guided hydrodissection.

## 2016-11-16 NOTE — Progress Notes (Signed)
[name] - 41 y.o. female MRN [id number]  Date of birth: 09/24/1975  Office Visit Note: Visit Date: 11/16/2016 PCP: Barbette Reichmann, MD Referred by: Barbette Reichmann, MD  Subjective: Chief Complaint  Patient presents with  . pain in left thumb and wrist    Pt c/o pain in left and into wrist x 2 months. She has had 3 surgeries on this wrist in the past. She doesn't recall and recent injury to the wrist.    HPI: Patient presents with 2 months of worsening left wrist pain and discomfort along the median aspect of her thumb.  She denies any clicking or popping but does have a significant history of bilateral de Quervain's releases as well as to ganglion cyst removals from the left wrist.  She reports this does seem different than that.  Pain is worse first thing upon awakening. ROS:  Otherwise per HPI.  Objective:  VS:  HT:5\' 3"  (160 cm)   WT:180 lb 3.2 oz (81.7 kg)  BMI:32    BP:110/74  HR:84bpm  TEMP: ( )  RESP:98 % Physical Exam: GENERAL:  WDWN, NAD, Non-toxic appearing PSYCH:  Alert & appropriately interactive  Not depressed or anxious appearing UPPER EXTREMITIES:  No significant rashes/lesions/ulcerations overlying the arms.  Well-healed surgical incisions over the bilateral wrists  No significant generalized UE edema.  No clubbing or cyanosis.  Radial pulses 2+/4.  Sensation intact to light touch. BILATERAL WRIST:   Overall wrist  is well aligned, no significant deformity or atrophy.    No significant effusion or swelling.    Grip strength intact.  Wrist Extension equal and symmetric.  No significant TTP over the anatomic snuff box, distal radius & ulna, or proximal & distal carpal rows  No wrist effusion.  No pain with stressing of the bilateral CMC's, MCPs or IP joints of the left thumb.  No significant pain with carpal tunnel compression test, Tinel's or Phalen's.    Imaging & Procedures: No results found. LIMITED MSK ULTRASOUND OF LEFT WRIST AND  HAND Images were obtained and interpreted by myself, Gaspar Bidding, DO  Images have been saved and stored to PACS system. Images obtained on: GE S7 Ultrasound machine  FINDINGS:  First through sixth dorsal compartments overall normal appearing with only a small amount of scarring over the first dorsal compartment from the prior release.  No significant osteophytic spurring or joint effusions  Left median nerve is flattened and markedly thickened with a maximal measurement of 0.15 cm.  Right median nerve is normal appearing measuring 0.8 cm.  IMPRESSION: 1. Left carpal tunnel syndrome with bifid median nerve   Assessment & Plan: Problem List Items Addressed This Visit    Carpal tunnel syndrome - Primary    Patient has a bifid and flattened median nerve.  This is likely was contributing to the discomfort she has especially upon first awakening.  We will have her begin with carpal tunnel stretches as well as with a cockup wrist splint at night.  Topical Pennsaid.  If any lack of improvement will consider ultrasound-guided hydrodissection.      Relevant Medications   ALPRAZolam (XANAX) 0.25 MG tablet   cyclobenzaprine (FLEXERIL) 10 MG tablet    Other Visit Diagnoses    Thumb pain, left       Relevant Orders   [place] LIMITED JOINT SPACE STRUCTURES UP LEFT      Follow-up: No Follow-up on file.   Past Medical/Family/Surgical/Social History: Medications & Allergies reviewed per EMR Patient Active Problem  List   Diagnosis Date Noted  . Carpal tunnel syndrome 11/16/2016   No past medical history on file. No family history on file. Past Surgical History:  Procedure Laterality Date  . DE QUERVAIN'S RELEASE Bilateral   . TONSILLECTOMY    . WRIST SURGERY Left    2 ganglion cyst removed   Social History   Occupational History  . Not on file.   Social History Main Topics  . Smoking status: Former Games developer  . Smokeless tobacco: Not on file  . Alcohol use Not on file      Comment: occ  . Drug use: No  . Sexual activity: Not on file

## 2016-12-01 ENCOUNTER — Other Ambulatory Visit: Payer: Self-pay | Admitting: Sports Medicine

## 2016-12-01 ENCOUNTER — Ambulatory Visit (INDEPENDENT_AMBULATORY_CARE_PROVIDER_SITE_OTHER): Payer: 59 | Admitting: Sports Medicine

## 2016-12-01 ENCOUNTER — Encounter: Payer: Self-pay | Admitting: Sports Medicine

## 2016-12-01 ENCOUNTER — Ambulatory Visit: Payer: Self-pay

## 2016-12-01 VITALS — BP 122/80 | HR 78

## 2016-12-01 DIAGNOSIS — G5602 Carpal tunnel syndrome, left upper limb: Secondary | ICD-10-CM

## 2016-12-01 NOTE — Assessment & Plan Note (Signed)
Patient has had issues with de Quervain's tenosynovitis in the past with this seems to be slightly different.  She does have poor motion of the first dorsal compartment however given the findings on ultrasound of the ulnar nerve especially when compared to the right side symptoms are felt more likely to be associated with this.  She does have a tethering effect of the ulnar nerve to the flexor tendons this is causing a transposition.  This will likely respond well to ultrasound-guided hydrodissection and injection otherwise would consider further testing with both nerve conduction studies and MRI of the wrist.

## 2016-12-01 NOTE — Progress Notes (Signed)
[name] - 41 y.o. female MRN [id number]  Date of birth: 1976/01/18  Office Visit Note: Visit Date: 12/01/2016 PCP: Barbette Reichmann, MD Referred by: Barbette Reichmann, MD  Subjective: Chief Complaint  Patient presents with  . Follow-up Carpal Tunnel LT wrist   HPI: Patient presents with persistent left sided hand and wrist symptoms.  She is having pain along the thumb more so than anywhere reports this feels significantly different than the de Quervain's tenosynovitis she has had in the past and undergone a prior first compartment release for.  She is interested in injection today after findings on the ultrasound several weeks ago revealed a flattened and bipartite median nerve.  Pain is worse first thing in the morning upon awakening.  She has continue to use the thumb spica splint provided. ROS:   Otherwise per HPI.  Objective:  VS:  HT:    WT:   BMI:     BP:122/80  HR:78bpm  TEMP: ( )  RESP:98 % Physical Exam: GENERAL:  WDWN, NAD, Non-toxic appearing PSYCH:  Alert & appropriately interactive  Not depressed or anxious appearing UPPER EXTREMITIES:  No significant rashes/lesions/ulcerations overlying the arms.  Postsurgical scarring along 1st dorsal compartment  No significant generalized UE edema.  No clubbing or cyanosis.  Radial & ulnar pulses 2+/4.  Sensation intact to light touch. LEFT WRIST:  Minimal older deviation with the wrist is present bilaterally.  No pain with this motion.  No pain with Lourena Simmonds testing.  She has minimal thumb flexion at the MCP.  This is nonpainful for her.  No pain with carpal tunnel compression test or Tinel's.   Imaging & Procedures: No results found. PROCEDURE NOTE: ULTRASOUND GUIDED LEFT CARPAL TUNNEL HYDRODISSECTION ANDINJECTION  Images were obtained and interpreted by myself, Gaspar Bidding, DO  Images have been saved and stored to PACS system. Images obtained on: GE S7 Ultrasound machine  DESCRIPTION OF  PROCEDURE:  The patient's clinical condition is marked by substantial pain and/or significant functional disability. Other conservative therapy has not provided relief, is contraindicated, or not appropriate. There is a reasonable likelihood that injection will significantly improve the patient's pain and/or functional impairment. After discussing the risks, benefits and expected outcomes of the injection and all questions were reviewed and answered, the patient wished to undergo the above named procedure. Verbal consent was obtained. The ultrasound was used to identify the target structure and adjacent neurovascular structures. The skin was then prepped in sterile fashion and the target structure was injected under direct visualization using sterile technique as below: PREP: Alcohol, Ethel Chloride APPROACH: Radial sided, 25 g 1 .5" needle INJECTATE: 1cc 1% lidocaine, 1cc 0.5% marcaine, 0.5cc 40mg  DepoMedrol ASPIRATE: N/A DRESSING: Band-Aid Post procedural instructions including recommending icing and warning signs for infection were reviewed. This procedure was well tolerated and there were no complications.    The median nerve was visualized and there is an adjacent artery along the ulnar aspect.  Prior to the injection was noted that there was entrapment of the median nerve on the radial side of the flexor tendons.  This would worsen with wrist flexion and improved with wrist extension.  It was not consistently reproducible but did have a demonstrable translocation once again from the radial sided flexor tendons to the ulnar-sided flexor tendons intermittently.  IMPRESSION: Succesful US Guided Hydro dissection and injection    Assessment & Plan: Problem List Items Addressed This Visit    Carpal tunnel syndrome - Primary    Patient has had  issues with de Quervain's tenosynovitis in the past with this seems to be slightly different.  She does have poor motion of the first dorsal compartment  however given the findings on ultrasound of the ulnar nerve especially when compared to the right side symptoms are felt more likely to be associated with this.  She does have a tethering effect of the ulnar nerve to the flexor tendons this is causing a transposition.  This will likely respond well to ultrasound-guided hydrodissection and injection otherwise would consider further testing with both nerve conduction studies and MRI of the wrist.      Relevant Orders   US GUIDED NEEDLE PLACEMENT(NO LINKED CHARGES)      Follow-up: Return if symptoms worsen or fail to improve.   Past Medical/Family/Surgical/Social History: Medications & Allergies reviewed per EMR Patient Active Problem List   Diagnosis Date Noted  . Carpal tunnel syndrome 11/16/2016   History reviewed. No pertinent past medical history. History reviewed. No pertinent family history. Past Surgical History:  Procedure Laterality Date  . DE QUERVAIN'S RELEASE Bilateral   . TONSILLECTOMY    . WRIST SURGERY Left    2 ganglion cyst removed   Social History   Occupational History  . Not on file.   Social History Main Topics  . Smoking status: Former Games developer  . Smokeless tobacco: Never Used  . Alcohol use Yes     Comment: occ  . Drug use: No  . Sexual activity: Not on file

## 2016-12-08 ENCOUNTER — Other Ambulatory Visit: Payer: Self-pay | Admitting: Family Medicine

## 2016-12-08 MED ORDER — AMOXICILLIN 875 MG PO TABS: 875.0000 mg | ORAL_TABLET | Freq: Two times a day (BID) | ORAL | 0 refills | Status: DC

## 2016-12-14 ENCOUNTER — Ambulatory Visit (INDEPENDENT_AMBULATORY_CARE_PROVIDER_SITE_OTHER): Payer: Self-pay | Admitting: Family Medicine

## 2016-12-14 DIAGNOSIS — K0889 Other specified disorders of teeth and supporting structures: Secondary | ICD-10-CM

## 2016-12-14 MED ORDER — HYDROCODONE-ACETAMINOPHEN 10-325 MG PO TABS: 1.0000 | ORAL_TABLET | Freq: Three times a day (TID) | ORAL | 0 refills | Status: DC | PRN

## 2016-12-14 NOTE — Progress Notes (Signed)
Zamya SALIMAH OTTUM is a 41 y.o. female here for a new problem.  History of Present Illness:   No chief complaint on file.   Dental Pain   This is a new problem. The current episode started in the past 7 days. The problem occurs constantly. The pain is moderate. Pertinent negatives include no fever. She has tried NSAIDs and ice for the symptoms. The treatment provided mild relief.   PMHx, SurgHx, SocialHx, Medications, and Allergies were reviewed in the Visit Navigator and updated as appropriate.  Current Medications:   Current Outpatient Prescriptions:  .  ALPRAZolam (XANAX) 0.25 MG tablet, TAKE 1 TABLET BY MOUTH ONCE DAILY AS NEEDED FOR SLEEP, Disp: , Rfl: 0 .  amoxicillin (AMOXIL) 875 MG tablet, Take 1 tablet (875 mg total) by mouth 2 (two) times daily., Disp: 20 tablet, Rfl: 0 .  cyclobenzaprine (FLEXERIL) 10 MG tablet, TAKE 1 TABLET BY MOUTH TWICE DAILY AS NEEDED FOR MUSCLE SPASMS FOR UP TO 10 DAYS, Disp: , Rfl:  .  Diclofenac Sodium (PENNSAID) 2 % SOLN, Place 1 application onto the skin 2 (two) times daily., Disp: 112 g, Rfl: 2 .  HYDROcodone-acetaminophen (NORCO) 10-325 MG tablet, Take 1 tablet by mouth every 8 (eight) hours as needed., Disp: 30 tablet, Rfl: 0 .  ibuprofen (ADVIL,MOTRIN) 200 MG tablet, Take 200 mg by mouth every 6 (six) hours as needed for pain., Disp: , Rfl:  .  levonorgestrel (MIRENA) 20 MCG/24HR IUD, by Intrauterine route., Disp: , Rfl:  .  Multiple Vitamin (MULTIVITAMIN WITH MINERALS) TABS, Take 1 tablet by mouth daily., Disp: , Rfl:  .  Omega-3 Fatty Acids (FISH OIL PO), Take 1 capsule by mouth daily., Disp: , Rfl:  .  propranolol (INDERAL) 20 MG tablet, Take 20 mg by mouth daily as needed., Disp: , Rfl:  .  Vitamin D, Ergocalciferol, (DRISDOL) 50000 units CAPS capsule, TAKE 1 CAPSULE (50,000 UNITS TOTAL) BY MOUTH ONCE A WEEK., Disp: , Rfl: 2   Review of Systems:   Review of Systems  Constitutional: Negative for chills and fever.   Vitals:   There were no  vitals filed for this visit.   There is no height or weight on file to calculate BMI.  Physical Exam:   Physical Exam  HENT:  Head:      Assessment and Plan:    Diagnoses and all orders for this visit:  Dentalgia -     HYDROcodone-acetaminophen (NORCO) 10-325 MG tablet; Take 1 tablet by mouth every 8 (eight) hours as needed.   . Reviewed expectations re: course of current medical issues. . Discussed self-management of symptoms. . Outlined signs and symptoms indicating need for more acute intervention. . Patient verbalized understanding and all questions were answered. . See orders for this visit as documented in the electronic medical record. . Patient received an After-Visit Summary.  Helane Rima, D.O.

## 2017-02-09 ENCOUNTER — Other Ambulatory Visit: Payer: Self-pay | Admitting: Family Medicine

## 2017-02-09 DIAGNOSIS — R930 Abnormal findings on diagnostic imaging of skull and head, not elsewhere classified: Secondary | ICD-10-CM

## 2017-02-21 ENCOUNTER — Encounter: Payer: Self-pay | Admitting: Neurology

## 2017-02-21 ENCOUNTER — Other Ambulatory Visit: Payer: Self-pay | Admitting: Family Medicine

## 2017-02-21 MED ORDER — ALBUTEROL SULFATE HFA 108 (90 BASE) MCG/ACT IN AERS: 2.0000 | INHALATION_SPRAY | Freq: Four times a day (QID) | RESPIRATORY_TRACT | 0 refills | Status: DC | PRN

## 2017-02-23 ENCOUNTER — Other Ambulatory Visit: Payer: Self-pay | Admitting: Physician Assistant

## 2017-02-23 MED ORDER — METHYLPREDNISOLONE 4 MG PO TBPK: ORAL_TABLET | ORAL | 0 refills | Status: DC

## 2017-03-18 ENCOUNTER — Other Ambulatory Visit: Payer: Self-pay | Admitting: Family Medicine

## 2017-03-18 MED ORDER — HYDROCOD POLST-CPM POLST ER 10-8 MG/5ML PO SUER: 5.0000 mL | Freq: Every evening | ORAL | 0 refills | Status: DC | PRN

## 2017-03-18 NOTE — Progress Notes (Signed)
Rx for significant cough.   Helane Rima, D.O. Family [facility] [employer], [facility]

## 2017-04-06 ENCOUNTER — Other Ambulatory Visit: Payer: Self-pay | Admitting: Family Medicine

## 2017-04-06 MED ORDER — PROPRANOLOL HCL 20 MG PO TABS: 20.0000 mg | ORAL_TABLET | Freq: Every day | ORAL | 3 refills | Status: DC | PRN

## 2017-04-06 MED ORDER — CYCLOBENZAPRINE HCL 10 MG PO TABS: ORAL_TABLET | ORAL | 0 refills | Status: DC

## 2017-04-06 MED ORDER — ALPRAZOLAM 0.25 MG PO TABS: ORAL_TABLET | ORAL | 0 refills | Status: DC

## 2017-04-06 MED FILL — ALPRAZolam 0.25 MG TABS: 0.25 | 30 days supply | Qty: 30 | Fill #0

## 2017-04-06 MED FILL — PROPRANOLOL 20 MG TABLET: 20 | 90 days supply | Qty: 90 | Fill #0

## 2017-04-06 MED FILL — CYCLOBENZAPRINE 10 MG TABLE: 10 | 15 days supply | Qty: 30 | Fill #0

## 2017-04-06 MED FILL — VIT D2 1.25 MG (50,000 UNIT: 1.25 MG | 84 days supply | Qty: 12 | Fill #0

## 2017-04-14 ENCOUNTER — Other Ambulatory Visit: Payer: Self-pay | Admitting: Family Medicine

## 2017-04-14 MED ORDER — TOPIRAMATE 50 MG PO TABS: 50.0000 mg | ORAL_TABLET | Freq: Two times a day (BID) | ORAL | 6 refills | Status: DC

## 2017-04-19 ENCOUNTER — Encounter: Payer: Self-pay | Admitting: Neurology

## 2017-04-19 ENCOUNTER — Ambulatory Visit (INDEPENDENT_AMBULATORY_CARE_PROVIDER_SITE_OTHER): Payer: 59 | Admitting: Neurology

## 2017-04-19 ENCOUNTER — Other Ambulatory Visit (INDEPENDENT_AMBULATORY_CARE_PROVIDER_SITE_OTHER): Payer: 59

## 2017-04-19 VITALS — BP 124/84 | HR 91 | Ht 64.0 in | Wt 187.5 lb

## 2017-04-19 DIAGNOSIS — R5383 Other fatigue: Secondary | ICD-10-CM | POA: Diagnosis not present

## 2017-04-19 DIAGNOSIS — E559 Vitamin D deficiency, unspecified: Secondary | ICD-10-CM

## 2017-04-19 DIAGNOSIS — R930 Abnormal findings on diagnostic imaging of skull and head, not elsewhere classified: Secondary | ICD-10-CM

## 2017-04-19 DIAGNOSIS — G35 Multiple sclerosis: Secondary | ICD-10-CM

## 2017-04-19 DIAGNOSIS — R9082 White matter disease, unspecified: Secondary | ICD-10-CM

## 2017-04-19 LAB — VITAMIN D 25 HYDROXY (VIT D DEFICIENCY, FRACTURES): VITD: 27.53 ng/mL — AB (ref 30.00–100.00)

## 2017-04-19 LAB — CBC
HCT: 42.1 % (ref 36.0–46.0)
Hemoglobin: 14.3 g/dL (ref 12.0–15.0)
MCHC: 33.9 g/dL (ref 30.0–36.0)
MCV: 95.9 fl (ref 78.0–100.0)
Platelets: 268 10*3/uL (ref 150.0–400.0)
RBC: 4.39 Mil/uL (ref 3.87–5.11)
RDW: 12.8 % (ref 11.5–15.5)
WBC: 12.1 10*3/uL — AB (ref 4.0–10.5)

## 2017-04-19 LAB — COMPREHENSIVE METABOLIC PANEL
ALBUMIN: 4.2 g/dL (ref 3.5–5.2)
ALK PHOS: 102 U/L (ref 39–117)
ALT: 10 U/L (ref 0–35)
AST: 11 U/L (ref 0–37)
BUN: 15 mg/dL (ref 6–23)
CHLORIDE: 104 meq/L (ref 96–112)
CO2: 28 mEq/L (ref 19–32)
CREATININE: 0.57 mg/dL (ref 0.40–1.20)
Calcium: 9.5 mg/dL (ref 8.4–10.5)
GFR: 123.45 mL/min (ref >60.00)
Glucose, Bld: 96 mg/dL (ref 70–99)
Potassium: 4 mEq/L (ref 3.5–5.1)
SODIUM: 139 meq/L (ref 135–145)
TOTAL PROTEIN: 7.3 g/dL (ref 6.0–8.3)
Total Bilirubin: 0.3 mg/dL (ref 0.2–1.2)

## 2017-04-19 LAB — VITAMIN B12: Vitamin B-12: 618 pg/mL (ref 211–911)

## 2017-04-19 LAB — TSH: TSH: 2.72 u[IU]/mL (ref 0.35–4.50)

## 2017-04-19 NOTE — Patient Instructions (Addendum)
1. Bloodwork for CBC, CMP, vitamin D, vitamin B12, TSH 2. Schedule MRI brain with and without contrast 3. Follow-up in 6 months, call for any changes

## 2017-04-19 NOTE — Progress Notes (Signed)
NEUROLOGY CONSULTATION NOTE  [name] MRN: [id number] DOB: 08-13-1976  Referring provider: Dr. Helane Rima Primary care provider: Dr. Helane Rima  Reason for consult:  Second opinion for abnormal MRI brain  Dear Dr Earlene Plater:  Thank you for your kind referral of [name] for consultation of the above symptoms. Although her history is well known to you, please allow me to reiterate it for the purpose of our medical record. Records and images were personally reviewed where available.  HISTORY OF PRESENT ILLNESS: This is a pleasant 41 year old left-handed woman with no significant past medical history presenting for second opinion of abnormal MRI brain. She has been having high-pitched tinnitus for at least 5 years, and when it got louder on the left side, she saw ENT and had an MRI brain with and without contrast in December 2017 which had shown scattered supratentorial white matter T2 hyperintensities, including subcentimeter radiating from the left periventricular margin, wedge-like left frontal subcortical region signal changes. No abnormal enhancement seen. It was interpreted as progressed white matter changes suspicious for chronic demyelination or prior infection, possible underlying chronic small vessel ischemic disease. She had seen neurologist Dr. Malvin Johns, at that time she reported an episode around 2005 where she was unable to walk a straight line for 5 days that self-resolved. She also reported occasional tingling in her left arm. She has a history of bilateral wrist surgeries. She continues to note the occasional left arm tingling lasting up to a day for the past 1.5 years. She denies any prior history of vision loss or vision changes. She has a lot of fatigue for the past year, sometimes she feels "almost like she is weighed down." She has had headaches for the past 20 years, worse for the past couple of years, occurring 3-4 times a week, with throbbing over the occipital  region or diffusely, no associated nausea/vomiting, photo/phonophobia. She takes prn Ibuprofen. She has a prescription for Topamax but has not started it yet. She takes propranolol for anxiety. She has a lot of neck and lower back pain. No Lhermitte's sign. She denies any diplopia, dysarthria/dysphagia, bladder dysfunction. She has some constipation.   She had a lumbar puncture done in December 2017. CSF WBC 14 with 82% lymphocytes, RBC 5, protein 36, glucose 60, negative HSV, culture. There were 3 oligoclonal bands seen in the CSF not detected in serum. IgG normal. It was noted that 4 or more OCB have been shown to be most consistent with MS using this laboratory's method. She had a low vitamin D level and took replacement therapy for several weeks.  PAST MEDICAL HISTORY: No past medical history on file.  PAST SURGICAL HISTORY: Past Surgical History:  Procedure Laterality Date  . DE QUERVAIN'S RELEASE Bilateral   . TONSILLECTOMY    . WRIST SURGERY Left    2 ganglion cyst removed    MEDICATIONS: Current Outpatient Prescriptions on File Prior to Visit  Medication Sig Dispense Refill  . ALPRAZolam (XANAX) 0.25 MG tablet TAKE 1 TABLET BY MOUTH ONCE DAILY AS NEEDED FOR SLEEP 30 tablet 0  . cyclobenzaprine (FLEXERIL) 10 MG tablet TAKE 1 TABLET BY MOUTH TWICE DAILY AS NEEDED FOR MUSCLE SPASMS FOR UP TO 10 DAYS 30 tablet 0  . Diclofenac Sodium (PENNSAID) 2 % SOLN Place 1 application onto the skin 2 (two) times daily. 112 g 2  . HYDROcodone-acetaminophen (NORCO) 10-325 MG tablet Take 1 tablet by mouth every 8 (eight) hours as needed. 30 tablet 0  .  ibuprofen (ADVIL,MOTRIN) 200 MG tablet Take 200 mg by mouth every 6 (six) hours as needed for pain.    Marland Kitchen levonorgestrel (MIRENA) 20 MCG/24HR IUD by Intrauterine route.    . Multiple Vitamin (MULTIVITAMIN WITH MINERALS) TABS Take 1 tablet by mouth daily.    . Omega-3 Fatty Acids (FISH OIL PO) Take 1 capsule by mouth daily.    . propranolol (INDERAL) 20  MG tablet Take 1 tablet (20 mg total) by mouth daily as needed. 90 tablet 3  . topiramate (TOPAMAX) 50 MG tablet Take 1 tablet (50 mg total) by mouth 2 (two) times daily. 60 tablet 6   No current facility-administered medications on file prior to visit.     ALLERGIES: Allergies  Allergen Reactions  . Codeine Nausea Only  . Other Other (See Comments)    Anxious - couldn't sit still (brompheniramine DM)    FAMILY HISTORY: No family history on file.  SOCIAL HISTORY: Social History   Social History  . Marital status: Divorced    Spouse name: N/A  . Number of children: N/A  . Years of education: N/A   Occupational History  . Not on file.   Social History Main Topics  . Smoking status: Former Games developer  . Smokeless tobacco: Never Used  . Alcohol use Yes     Comment: occ  . Drug use: No  . Sexual activity: Not on file   Other Topics Concern  . Not on file   Social History Narrative  . No narrative on file    REVIEW OF SYSTEMS: Constitutional: No fevers, chills, or sweats, + generalized fatigue,no change in appetite Eyes: No visual changes, double vision, eye pain Ear, nose and throat: No hearing loss, ear pain, nasal congestion, sore throat Cardiovascular: No chest pain, palpitations Respiratory:  No shortness of breath at rest or with exertion, wheezes GastrointestinaI: No nausea, vomiting, diarrhea, abdominal pain, fecal incontinence Genitourinary:  No dysuria, urinary retention or frequency Musculoskeletal:  + neck pain, back pain Integumentary: No rash, pruritus, skin lesions Neurological: as above Psychiatric: No depression, insomnia, anxiety Endocrine: No palpitations, +fatigue,no diaphoresis, mood swings, change in appetite, change in weight, increased thirst Hematologic/Lymphatic:  No anemia, purpura, petechiae. Allergic/Immunologic: no itchy/runny eyes, nasal congestion, recent allergic reactions, rashes  PHYSICAL EXAM: Vitals:   04/19/17 1410  BP: 124/84   Pulse: 91   General: No acute distress Head:  Normocephalic/atraumatic Eyes: Fundoscopic exam shows bilateral sharp discs, no vessel changes, exudates, or hemorrhages Neck: supple, no paraspinal tenderness, full range of motion Back: No paraspinal tenderness Heart: regular rate and rhythm Lungs: Clear to auscultation bilaterally. Vascular: No carotid bruits. Skin/Extremities: No rash, no edema Neurological Exam: Mental status: alert and oriented to person, place, and time, no dysarthria or aphasia, Fund of knowledge is appropriate.  Recent and remote memory are intact.  Attention and concentration are normal.    Able to name objects and repeat phrases. Cranial nerves: CN I: not tested CN II: pupils equal, round and reactive to light, visual acuity with glasses 20/30 OU, no red color desaturation, visual fields intact, fundi unremarkable. CN III, IV, VI:  full range of motion, no nystagmus, no ptosis CN V: facial sensation intact CN VII: upper and lower face symmetric CN VIII: hearing intact to finger rub CN IX, X: gag intact, uvula midline CN XI: sternocleidomastoid and trapezius muscles intact CN XII: tongue midline Bulk & Tone: normal, no fasciculations. Motor: 5/5 throughout with no pronator drift. Sensation: intact to light touch, cold, pin,  vibration and joint position sense.  No extinction to double simultaneous stimulation.  Romberg test negative Deep Tendon Reflexes: +2 throughout except for +1 bilateral ankle jerks, no ankle clonus Plantar responses: downgoing bilaterally Cerebellar: no incoordination on finger to nose, heel to shin. No dysdiadochokinesia Gait: narrow-based and steady, able to tandem walk adequately. Tremor: none  IMPRESSION: This is a pleasant 41 year old left-handed woman with several years history of tinnitus, who underwent an MRI brain with and without contrast for the tinnitus. MRI brain showed white matter changes that were felt to be concerning for  chronic demyelination or prior infection, possible underlying chronic microvascular disease. She does not have any clear clinical symptoms of demyelinating disease, neurological exam today normal. She had a lumbar puncture which showed 3 OCB, we discussed how this does not clearly diagnosed MS, with cut-off in our laboratory usually 4 OCB or more. LP had also shown an elevated CSF WBC of 14 with 82% lymphocytes, normal protein. Recommend repeating MRI brain with and without contrast to assess for interval change. She is reporting a lot of fatigue, which is nonspecific, re-check vitamin D level, TSH, B12, CBC, and CMP. She will follow-up in 6 months and knows to call for any changes.   Thank you for allowing me to participate in the care of this patient. Please do not hesitate to call for any questions or concerns.   [name], M.D.  CC: Dr. Earlene Plater

## 2017-04-20 ENCOUNTER — Other Ambulatory Visit: Payer: Self-pay | Admitting: Family Medicine

## 2017-04-20 MED ORDER — TOPIRAMATE 50 MG PO TABS: 50.0000 mg | ORAL_TABLET | Freq: Two times a day (BID) | ORAL | 6 refills | Status: DC

## 2017-04-21 ENCOUNTER — Telehealth: Payer: Self-pay

## 2017-04-21 NOTE — Telephone Encounter (Signed)
LMOM relaying message below.  

## 2017-04-21 NOTE — Telephone Encounter (Signed)
-----   Message from Karen M Aquino, MD sent at 04/20/2017  8:47 AM EDT ----- Pls let her know the thyroid and B12 levels are normal, her vitamin D level is still low. Her liver and kidney functions look good. Her white count is slightly elevated, would recommend she follow-up with her PCP for the vitamin D and white count. Pls fax labs to her PCP, thanks 

## 2017-04-21 NOTE — Telephone Encounter (Signed)
-----   Message from Van Clines, MD sent at 04/20/2017  8:47 AM EDT ----- Pls let her know the thyroid and B12 levels are normal, her vitamin D level is still low. Her liver and kidney functions look good. Her white count is slightly elevated, would recommend she follow-up with her PCP for the vitamin D and white count. Pls fax labs to her PCP, thanks

## 2017-04-22 ENCOUNTER — Other Ambulatory Visit: Payer: Self-pay | Admitting: Family Medicine

## 2017-04-22 MED ORDER — TRAMADOL HCL 50 MG PO TABS: 50.0000 mg | ORAL_TABLET | Freq: Three times a day (TID) | ORAL | 0 refills | Status: DC | PRN

## 2017-04-28 ENCOUNTER — Other Ambulatory Visit: Payer: Self-pay | Admitting: Surgical

## 2017-04-28 DIAGNOSIS — K219 Gastro-esophageal reflux disease without esophagitis: Secondary | ICD-10-CM

## 2017-04-28 DIAGNOSIS — D729 Disorder of white blood cells, unspecified: Secondary | ICD-10-CM

## 2017-04-28 MED ORDER — CYCLOBENZAPRINE HCL 10 MG PO TABS: ORAL_TABLET | ORAL | 0 refills | Status: DC

## 2017-04-28 MED ORDER — ESOMEPRAZOLE MAGNESIUM 40 MG PO CPDR: 40.0000 mg | DELAYED_RELEASE_CAPSULE | Freq: Every day | ORAL | 3 refills | Status: DC

## 2017-04-28 MED FILL — CYCLOBENZAPRINE 10 MG TABLE: 10 | 15 days supply | Qty: 30 | Fill #0

## 2017-04-28 MED FILL — ESOMEPRAZOLE MAG DR 40 MG C: 40 | 30 days supply | Qty: 30 | Fill #0

## 2017-05-03 ENCOUNTER — Other Ambulatory Visit: Payer: Self-pay | Admitting: Surgical

## 2017-05-03 MED ORDER — ALPRAZOLAM 0.5 MG PO TABS: 0.5000 mg | ORAL_TABLET | Freq: Every evening | ORAL | 0 refills | Status: DC | PRN

## 2017-05-03 MED FILL — ALPRAZolam 0.5 MG TABS: 0.5 | 30 days supply | Qty: 30 | Fill #0

## 2017-05-05 ENCOUNTER — Other Ambulatory Visit (INDEPENDENT_AMBULATORY_CARE_PROVIDER_SITE_OTHER): Payer: 59

## 2017-05-05 ENCOUNTER — Ambulatory Visit
Admission: RE | Admit: 2017-05-05 | Discharge: 2017-05-05 | Disposition: A | Discharge status: Home / Self Care | Payer: 59 | Source: Ambulatory Visit | Attending: Neurology | Admitting: Neurology

## 2017-05-05 DIAGNOSIS — D729 Disorder of white blood cells, unspecified: Secondary | ICD-10-CM

## 2017-05-05 DIAGNOSIS — G35 Multiple sclerosis: Secondary | ICD-10-CM | POA: Diagnosis not present

## 2017-05-05 LAB — CBC WITH DIFFERENTIAL/PLATELET
Basophils Absolute: 0.2 10*3/uL — ABNORMAL HIGH (ref 0.0–0.1)
Basophils Relative: 1.3 % (ref 0.0–3.0)
Eosinophils Absolute: 0.3 10*3/uL (ref 0.0–0.7)
Eosinophils Relative: 2.4 % (ref 0.0–5.0)
HCT: 40.7 % (ref 36.0–46.0)
Hemoglobin: 13.4 g/dL (ref 12.0–15.0)
Lymphocytes Relative: 25.9 % (ref 12.0–46.0)
Lymphs Abs: 3.3 10*3/uL (ref 0.7–4.0)
MCHC: 33 g/dL (ref 30.0–36.0)
MCV: 96.3 fl (ref 78.0–100.0)
Monocytes Absolute: 0.6 10*3/uL (ref 0.1–1.0)
Monocytes Relative: 5 % (ref 3.0–12.0)
Neutro Abs: 8.3 10*3/uL — ABNORMAL HIGH (ref 1.4–7.7)
Neutrophils Relative %: 65.4 % (ref 43.0–77.0)
Platelets: 225 10*3/uL (ref 150.0–400.0)
RBC: 4.23 Mil/uL (ref 3.87–5.11)
RDW: 12.5 % (ref 11.5–15.5)
WBC: 12.7 10*3/uL — ABNORMAL HIGH (ref 4.0–10.5)

## 2017-05-05 MED ORDER — GADOBENATE DIMEGLUMINE 529 MG/ML IV SOLN
17.0000 mL | Freq: Once | INTRAVENOUS | Status: AC | PRN
  Administered 2017-05-05: 17 mL via INTRAVENOUS

## 2017-05-06 ENCOUNTER — Encounter: Payer: Self-pay | Admitting: Neurology

## 2017-05-09 ENCOUNTER — Other Ambulatory Visit: Payer: Self-pay | Admitting: Family Medicine

## 2017-05-09 MED ORDER — TRAMADOL HCL 50 MG PO TABS: 50.0000 mg | ORAL_TABLET | Freq: Two times a day (BID) | ORAL | 0 refills | Status: DC

## 2017-05-09 MED FILL — traMADol HCL 50 MG TABS: 50 | 90 days supply | Qty: 180 | Fill #0

## 2017-05-27 ENCOUNTER — Other Ambulatory Visit: Payer: Self-pay | Admitting: Family Medicine

## 2017-05-27 MED ORDER — TOPIRAMATE 50 MG PO TABS: 50.0000 mg | ORAL_TABLET | Freq: Two times a day (BID) | ORAL | 3 refills | Status: DC

## 2017-05-27 MED FILL — TOPIRAMATE 50 MG TABLET: 50 | 90 days supply | Qty: 180 | Fill #0

## 2017-06-06 ENCOUNTER — Other Ambulatory Visit: Payer: Self-pay | Admitting: Family Medicine

## 2017-06-06 MED FILL — ALPRAZolam 0.5 MG TABS: 0.5 | 30 days supply | Qty: 30 | Fill #0

## 2017-06-06 MED FILL — ESOMEPRAZOLE MAG DR 40 MG C: 40 | 30 days supply | Qty: 30 | Fill #1

## 2017-06-06 MED FILL — CYCLOBENZAPRINE 10 MG TABLE: 10 | 30 days supply | Qty: 60 | Fill #0

## 2017-06-06 NOTE — Telephone Encounter (Signed)
Yes

## 2017-06-06 NOTE — Telephone Encounter (Signed)
OK to refill

## 2017-06-07 ENCOUNTER — Ambulatory Visit (INDEPENDENT_AMBULATORY_CARE_PROVIDER_SITE_OTHER): Payer: 59 | Admitting: Family Medicine

## 2017-06-07 DIAGNOSIS — J069 Acute upper respiratory infection, unspecified: Secondary | ICD-10-CM

## 2017-06-07 MED ORDER — HYDROCOD POLST-CPM POLST ER 10-8 MG/5ML PO SUER: 5.0000 mL | Freq: Every evening | ORAL | 0 refills | Status: DC | PRN

## 2017-06-07 MED FILL — HYDROCODONE-CHLORPHENIRAM S: 10-8 | 12 days supply | Qty: 60 | Fill #0

## 2017-06-07 NOTE — Progress Notes (Signed)
[name] is a 41 y.o. female here for an acute visit.  History of Present Illness:   URI   This is a new problem. The current episode started in the past 7 days. The problem has been rapidly worsening. There has been no fever. Associated symptoms include congestion, coughing, rhinorrhea, a sore throat and swollen glands. She has tried nothing for the symptoms.   PMHx, SurgHx, SocialHx, Medications, and Allergies were reviewed in the Visit Navigator and updated as appropriate.  Current Medications:   Current Outpatient Prescriptions:  .  ALPRAZolam (XANAX) 0.25 MG tablet, TAKE 1 TABLET BY MOUTH ONCE DAILY AS NEEDED FOR SLEEP, Disp: 30 tablet, Rfl: 0 .  ALPRAZolam (XANAX) 0.5 MG tablet, TAKE 1 TABLET BY MOUTH AT BEDTIME AS NEEDED FOR ANXIETY, Disp: 30 tablet, Rfl: 1 .  chlorpheniramine-HYDROcodone (TUSSIONEX PENNKINETIC ER) 10-8 MG/5ML SUER, Take 5 mLs by mouth at bedtime as needed for cough., Disp: 60 mL, Rfl: 0 .  cyclobenzaprine (FLEXERIL) 10 MG tablet, TAKE 1 TABLET BY MOUTH TWICE DAILY AS NEEDED FOR MUSCLE SPASMS FOR UP TO 10 DAYS, Disp: 30 tablet, Rfl: 0 .  esomeprazole (NEXIUM) 40 MG capsule, Take 1 capsule (40 mg total) by mouth daily., Disp: 30 capsule, Rfl: 3 .  ibuprofen (ADVIL,MOTRIN) 200 MG tablet, Take 200 mg by mouth every 6 (six) hours as needed for pain., Disp: , Rfl:  .  levonorgestrel (MIRENA) 20 MCG/24HR IUD, by Intrauterine route., Disp: , Rfl:  .  Multiple Vitamin (MULTIVITAMIN WITH MINERALS) TABS, Take 1 tablet by mouth daily., Disp: , Rfl:  .  Omega-3 Fatty Acids (FISH OIL PO), Take 1 capsule by mouth daily., Disp: , Rfl:  .  propranolol (INDERAL) 20 MG tablet, Take 1 tablet (20 mg total) by mouth daily as needed., Disp: 90 tablet, Rfl: 3 .  topiramate (TOPAMAX) 50 MG tablet, Take 1 tablet (50 mg total) by mouth 2 (two) times daily., Disp: 180 tablet, Rfl: 3 .  traMADol (ULTRAM) 50 MG tablet, Take 1 tablet (50 mg total) by mouth 2 (two) times daily., Disp: 180  tablet, Rfl: 0   Allergies  Allergen Reactions  . Codeine Nausea Only  . Other Other (See Comments)    Anxious - couldn't sit still (brompheniramine DM)   Review of Systems:   Pertinent items are noted in the HPI. Otherwise, ROS is negative.  Vitals:  There were no vitals filed for this visit.   There is no height or weight on file to calculate BMI. Physical Exam:   Physical Exam  Constitutional: She appears well-nourished.  HENT:  Head: Normocephalic and atraumatic.  Eyes: Pupils are equal, round, and reactive to light. EOM are normal.  Neck: Normal range of motion. Neck supple.  Cardiovascular: Normal rate, regular rhythm, normal heart sounds and intact distal pulses.   Pulmonary/Chest: Effort normal.  Abdominal: Soft.  Skin: Skin is warm.  Psychiatric: She has a normal mood and affect. Her behavior is normal.  Nursing note and vitals reviewed.   Assessment and Plan:   Diagnoses and all orders for this visit:  Viral upper respiratory tract infection -     chlorpheniramine-HYDROcodone (TUSSIONEX PENNKINETIC ER) 10-8 MG/5ML SUER; Take 5 mLs by mouth at bedtime as needed for cough.   . Reviewed expectations re: course of current medical issues. . Discussed self-management of symptoms. . Outlined signs and symptoms indicating need for more acute intervention. . Patient verbalized understanding and all questions were answered. Marland Kitchen Health Maintenance issues including appropriate  healthy diet, exercise, and smoking avoidance were discussed with patient. . See orders for this visit as documented in the electronic medical record. . Patient received an After Visit Summary.   Helane Rima, DO [facility] 06/07/2017  Future Appointments Date Time Provider Department Center  10/25/2017 4:00 PM Van Clines, MD [facility] None

## 2017-06-10 ENCOUNTER — Other Ambulatory Visit: Payer: Self-pay | Admitting: Family Medicine

## 2017-06-10 MED ORDER — HYDROCOD POLST-CPM POLST ER 10-8 MG/5ML PO SUER: 5.0000 mL | Freq: Every evening | ORAL | 0 refills | Status: DC | PRN

## 2017-06-13 ENCOUNTER — Encounter: Payer: Self-pay | Admitting: Sports Medicine

## 2017-06-13 ENCOUNTER — Ambulatory Visit (INDEPENDENT_AMBULATORY_CARE_PROVIDER_SITE_OTHER): Payer: 59 | Admitting: Sports Medicine

## 2017-06-13 VITALS — BP 110/70 | HR 76 | Ht 64.0 in | Wt 187.0 lb

## 2017-06-13 DIAGNOSIS — M9902 Segmental and somatic dysfunction of thoracic region: Secondary | ICD-10-CM | POA: Diagnosis not present

## 2017-06-13 DIAGNOSIS — M542 Cervicalgia: Secondary | ICD-10-CM | POA: Diagnosis not present

## 2017-06-13 DIAGNOSIS — M9901 Segmental and somatic dysfunction of cervical region: Secondary | ICD-10-CM | POA: Insufficient documentation

## 2017-06-13 DIAGNOSIS — M9900 Segmental and somatic dysfunction of head region: Secondary | ICD-10-CM

## 2017-06-13 NOTE — Procedures (Signed)
PROCEDURE NOTE : OSTEOPATHIC MANIPULATION The decision today to treat with Osteopathic Manipulative Therapy (OMT) was based on physical exam findings. Verbal consent was obtained after after explanation of risks, benefits and potential side effects, including acute pain flare, post manipulation soreness and need for repeat treatments.  Additional time was spent discussing the minimal risk of  injury to neurovascular structures for associated Cervical manipulation.  After verbal consent was obtained manipulation was performed as below:            Regions treated: Per examined regions as below and associated billing codes          Techniques used: Muscle Energy, MFR, HVLA and ART The patient tolerated the treatment well and reported Improved symptoms following treatment today. Patient was given medications, exercises, stretches and lifestyle modifications per AVS and verbally.     OSTEOPATHIC/STRUCTURAL EXAM FINDINGS:    OA FRS right  C3 FRS right  C6 FRS left  T2 through T6 neutral rotated left, side bent right  Right cranial torsion

## 2017-06-13 NOTE — Progress Notes (Signed)
OFFICE VISIT NOTE [name]. [name] [phone]  Windy Kalata - 41 y.o. female MRN [id number]  Date of birth: Nov 11, 1975  Visit Date: 06/13/2017  PCP: Helane Rima, DO   Referred by: Helane Rima, DO   SUBJECTIVE:   Chief Complaint  Patient presents with  . New Patient (Initial Visit)    neck pain   HPI: As below and per problem based documentation when appropriate.  Acute onset of right sided neck pain that is nonradicular in nature.  Occurred while turning her head to the right with an acute onset with gradual improvement over the past several days.  Continues to have a catch in her neck.  She denies any symptoms radiating into the upper extremities.  No associated weakness.  No fevers, chills, night sweats.  No other associated weakness.    ROS  Otherwise per HPI.  HISTORY & PERTINENT PRIOR DATA:  No specialty comments available. She reports that she has quit smoking. She has never used smokeless tobacco. No results for input(s): HGBA1C, LABURIC in the last 8760 hours. Allergies reviewed per EMR Prior to Admission medications   Medication Sig Start Date End Date Taking? Authorizing Provider  ALPRAZolam Prudy Feeler) 0.25 MG tablet TAKE 1 TABLET BY MOUTH ONCE DAILY AS NEEDED FOR SLEEP 04/06/17  Yes Helane Rima, DO  ALPRAZolam Prudy Feeler) 0.5 MG tablet TAKE 1 TABLET BY MOUTH AT BEDTIME AS NEEDED FOR ANXIETY 06/06/17  Yes Helane Rima, DO  chlorpheniramine-HYDROcodone (TUSSIONEX PENNKINETIC ER) 10-8 MG/5ML SUER Take 5 mLs by mouth at bedtime as needed for cough. 06/10/17  Yes Helane Rima, DO  cyclobenzaprine (FLEXERIL) 10 MG tablet TAKE 1 TABLET BY MOUTH TWICE DAILY AS NEEDED FOR MUSCLE SPASMS FOR UP TO 10 DAYS 04/28/17  Yes Helane Rima, DO  esomeprazole (NEXIUM) 40 MG capsule Take 1 capsule (40 mg total) by mouth daily. 04/28/17  Yes Helane Rima, DO  ibuprofen (ADVIL,MOTRIN) 200 MG tablet Take 200 mg by mouth  every 6 (six) hours as needed for pain.   Yes [provider]  levonorgestrel (MIRENA) 20 MCG/24HR IUD by Intrauterine route.   Yes [provider]  Multiple Vitamin (MULTIVITAMIN WITH MINERALS) TABS Take 1 tablet by mouth daily.   Yes [provider]  Omega-3 Fatty Acids (FISH OIL PO) Take 1 capsule by mouth daily.   Yes [provider]  propranolol (INDERAL) 20 MG tablet Take 1 tablet (20 mg total) by mouth daily as needed. 04/06/17  Yes Helane Rima, DO  topiramate (TOPAMAX) 50 MG tablet Take 1 tablet (50 mg total) by mouth 2 (two) times daily. 05/27/17  Yes Helane Rima, DO   Patient Active Problem List   Diagnosis Date Noted  . Neck pain 06/13/2017  . Carpal tunnel syndrome 11/16/2016  . MRI of brain abnormal 08/23/2016  . Tinnitus, bilateral 07/27/2016  . Low serum vitamin D 11/11/2015  . Generalized anxiety disorder 12/18/2014   No past medical history on file. No family history on file. Past Surgical History:  Procedure Laterality Date  . DE QUERVAIN'S RELEASE Bilateral   . TONSILLECTOMY    . WRIST SURGERY Left    2 ganglion cyst removed   Social History   Occupational History  . Not on file.   Social History Main Topics  . Smoking status: Former Games developer  . Smokeless tobacco: Never Used  . Alcohol use Yes     Comment: occ  . Drug use: No  .  Sexual activity: Not on file    OBJECTIVE:  VS:  HT:5\' 4"  (162.6 cm)   WT:187 lb (84.8 kg)  BMI:32.08    BP:110/70  HR:76bpm  TEMP: ( )  RESP:99 % EXAM: Findings:  Bilateral extremities overall well aligned.  No significant deformity.  Cervical range of motion is limited and rightward side bending and rotation.  Improved with osteopathic manipulation.  Upper extreme his sensation is intact light touch.  No significant focal weakness.  Negative Spurling's compression test and Lhermitte's compression test.    RADIOLOGY:  ASSESSMENT & PLAN:     ICD-10-CM   1. Neck pain M54.2  OSTEOPATHIC MANIPULATION TREATMENT  2. Segmental and somatic dysfunction of head region M99.00 OSTEOPATHIC MANIPULATION TREATMENT  3. Segmental and somatic dysfunction of cervical region M99.01 OSTEOPATHIC MANIPULATION TREATMENT  4. Segmental and somatic dysfunction of thoracic region M99.02 OSTEOPATHIC MANIPULATION TREATMENT   ================================================================= Neck pain Acute onset of neck pain with osteopathic manipulation with good improvement.  Short course of anti-inflammatories and therapeutic exercises per foundations videos.  Follow-up as needed  PROCEDURE NOTE : OSTEOPATHIC MANIPULATION The decision today to treat with Osteopathic Manipulative Therapy (OMT) was based on physical exam findings. Verbal consent was obtained after after explanation of risks, benefits and potential side effects, including acute pain flare, post manipulation soreness and need for repeat treatments.  Additional time was spent discussing the minimal risk of  injury to neurovascular structures for associated Cervical manipulation.  After verbal consent was obtained manipulation was performed as below:            Regions treated: Per examined regions as below and associated billing codes          Techniques used: Muscle Energy, MFR, HVLA and ART The patient tolerated the treatment well and reported Improved symptoms following treatment today. Patient was given medications, exercises, stretches and lifestyle modifications per AVS and verbally.     OSTEOPATHIC/STRUCTURAL EXAM FINDINGS:    OA FRS right  C3 FRS right  C6 FRS left  T2 through T6 neutral rotated left, side bent right  Right cranial torsion  ================================================================= Patient Instructions  Also check out [employer]" which is a program developed by Dr. Myles Lipps.   There are links to a couple of his YouTube Videos below and I would like to see performing one of  his videos 5-6 days per week.    A good intro video is: "Independence from Pain 7-minute Video" - [email or web address]   His more advanced video is: "Powerful Posture and Pain Relief: 12 minutes of Foundation Training" - [email or web address]://[email or web address].[email or web address]  Do not try to attempt this entire video when first beginning.    Try breaking of each exercise that he goes into shorter segments.  Otherwise if they perform an exercise for 45 seconds, start with 15 seconds and rest and then resume when they begin the new activity.    If you work your way up to doing this 12 minute video, I expect you will see significant improvements in your pain.  If you enjoy his videos and would like to find out more you can look on his website: [email or web address].  He has a workout streaming option as well as a DVD set available for purchase.  Amazon has the best price for his DVDs.     =================================================================   Follow-up: Return if symptoms worsen or fail to improve.   CMA/ATC served as Neurosurgeon during this visit. History, Physical, and Plan performed by medical provider. Documentation and orders  reviewed and attested to.      Teresa Coombs, [place] [facility] Physician

## 2017-06-13 NOTE — Assessment & Plan Note (Signed)
Acute onset of neck pain with osteopathic manipulation with good improvement.  Short course of anti-inflammatories and therapeutic exercises per foundations videos.  Follow-up as needed

## 2017-06-13 NOTE — Patient Instructions (Signed)

## 2017-06-17 MED FILL — HYDROCODONE-CHLORPHENIRAM S: 10-8 | 12 days supply | Qty: 60 | Fill #0

## 2017-06-28 ENCOUNTER — Encounter: Payer: Self-pay | Admitting: Sports Medicine

## 2017-06-28 ENCOUNTER — Ambulatory Visit (INDEPENDENT_AMBULATORY_CARE_PROVIDER_SITE_OTHER): Payer: 59 | Admitting: Sports Medicine

## 2017-06-28 VITALS — BP 110/76 | HR 76 | Ht 64.0 in | Wt 184.0 lb

## 2017-06-28 DIAGNOSIS — M542 Cervicalgia: Secondary | ICD-10-CM | POA: Diagnosis not present

## 2017-06-28 DIAGNOSIS — M9901 Segmental and somatic dysfunction of cervical region: Secondary | ICD-10-CM

## 2017-06-28 DIAGNOSIS — M9902 Segmental and somatic dysfunction of thoracic region: Secondary | ICD-10-CM | POA: Diagnosis not present

## 2017-06-28 DIAGNOSIS — M9908 Segmental and somatic dysfunction of rib cage: Secondary | ICD-10-CM

## 2017-06-28 MED ORDER — IBUPROFEN-FAMOTIDINE 800-26.6 MG PO TABS: 1.0000 | ORAL_TABLET | Freq: Three times a day (TID) | ORAL | 0 refills | Status: DC | PRN

## 2017-06-28 MED ORDER — IBUPROFEN-FAMOTIDINE 800-26.6 MG PO TABS: 1.0000 | ORAL_TABLET | Freq: Three times a day (TID) | ORAL | 2 refills | Status: DC | PRN

## 2017-06-28 NOTE — Procedures (Signed)
PROCEDURE NOTE : OSTEOPATHIC MANIPULATION The decision today to treat with Osteopathic Manipulative Therapy (OMT) was based on physical exam findings. Verbal consent was obtained after after explanation of risks, benefits and potential side effects, including acute pain flare, post manipulation soreness and need for repeat treatments.  Additional time was spent discussing the minimal risk of  injury to neurovascular structures for associated Cervical manipulation.  After verbal consent was obtained manipulation was performed as below:            Regions treated: Per examined regions as below and associated billing codes          Techniques used: Muscle Energy, MFR, HVLA and ART The patient tolerated the treatment well and reported Improved symptoms following treatment today. Patient was given medications, exercises, stretches and lifestyle modifications per AVS and verbally.     OSTEOPATHIC/STRUCTURAL EXAM FINDINGS:    C3 FRS right  C7 extended rotated left,  C6 flexed rotated right  T2 through T4 neutral rotated left, side bent right  T6 through T8 neutral rotated right side bent left  Posterior rib 5 on the right  Elevated first right rib

## 2017-06-28 NOTE — Patient Instructions (Signed)

## 2017-06-28 NOTE — Assessment & Plan Note (Signed)
No radicular symptoms, responded well to osteopathic manipulation.  2 weeks of anti-inflammatories and Goodman exercises.  Follow-up if any lack of improvement for repeat manipulation and consideration of plain film x-rays of her neck.

## 2017-06-28 NOTE — Progress Notes (Signed)
OFFICE VISIT NOTE [name]. [name] at [facility] [phone]  Windy Kalata - 41 y.o. female MRN [id number]  Date of birth: March 07, 1976  Visit Date: 06/28/2017  PCP: Helane Rima, DO   Referred by: Helane Rima, DO  Orlie Dakin, CMA acting as scribe for Dr. Berline Chough.  SUBJECTIVE:   Chief Complaint  Patient presents with  . Follow-up    neck pain   HPI: As below and per problem based documentation when appropriate.  [name] is an established patient presenting today in follow-up of RT-sided neck pain. She was last seen 06/13/17 and received OMT. She was provided with link for [employer].   Pt reports continues RT-sided neck pain. She did get relief from pain after OMT. She has also seen Judeth Cornfield in PT and received dry needling which did help temporarily. She has tried Ibuprofen with minimal relief. Sx have been going on for years but have recently started flaring. She has limited ROM. She denies HA. No radiation of pain into arms.     Review of Systems  Constitutional: Negative for chills and fever.  Respiratory: Negative for shortness of breath and wheezing.   Cardiovascular: Negative for chest pain and palpitations.  Musculoskeletal: Positive for neck pain.  Neurological: Negative for dizziness, tingling and headaches.    Otherwise per HPI.  HISTORY & PERTINENT PRIOR DATA:  No specialty comments available. She reports that she has quit smoking. She has never used smokeless tobacco. No results for input(s): HGBA1C, LABURIC in the last 8760 hours. Allergies reviewed per EMR Prior to Admission medications   Medication Sig Start Date End Date Taking? Authorizing Provider  ALPRAZolam Prudy Feeler) 0.5 MG tablet TAKE 1 TABLET BY MOUTH AT BEDTIME AS NEEDED FOR ANXIETY 06/06/17  Yes Helane Rima, DO  cyclobenzaprine (FLEXERIL) 10 MG tablet TAKE 1 TABLET BY MOUTH TWICE DAILY AS NEEDED FOR MUSCLE  SPASMS FOR UP TO 10 DAYS 04/28/17  Yes Helane Rima, DO  esomeprazole (NEXIUM) 40 MG capsule Take 1 capsule (40 mg total) by mouth daily. 04/28/17  Yes Helane Rima, DO  ibuprofen (ADVIL,MOTRIN) 200 MG tablet Take 200 mg by mouth every 6 (six) hours as needed for pain.   Yes [provider]  levonorgestrel (MIRENA) 20 MCG/24HR IUD by Intrauterine route.   Yes [provider]  Multiple Vitamin (MULTIVITAMIN WITH MINERALS) TABS Take 1 tablet by mouth daily.   Yes [provider]  Omega-3 Fatty Acids (FISH OIL PO) Take 1 capsule by mouth daily.   Yes [provider]  propranolol (INDERAL) 20 MG tablet Take 1 tablet (20 mg total) by mouth daily as needed. 04/06/17  Yes Helane Rima, DO  topiramate (TOPAMAX) 50 MG tablet Take 1 tablet (50 mg total) by mouth 2 (two) times daily. 05/27/17  Yes Helane Rima, DO  Ibuprofen-Famotidine (DUEXIS) 800-26.6 MG TABS Take 1 tablet by mouth 3 (three) times daily as needed. 06/28/17   [name], DO  Ibuprofen-Famotidine (DUEXIS) 800-26.6 MG TABS Take 1 tablet by mouth 3 (three) times daily as needed. 1 tab po tid X 14 days then 1 tab po tid as needed 06/28/17   [name], DO   Patient Active Problem List   Diagnosis Date Noted  . Neck pain 06/13/2017  . Carpal tunnel syndrome 11/16/2016  . MRI of brain abnormal 08/23/2016  . Tinnitus, bilateral 07/27/2016  . Low serum vitamin D 11/11/2015  . Generalized anxiety disorder 12/18/2014  No past medical history on file. No family history on file. Past Surgical History:  Procedure Laterality Date  . DE QUERVAIN'S RELEASE Bilateral   . TONSILLECTOMY    . WRIST SURGERY Left    2 ganglion cyst removed   Social History   Occupational History  . Not on file.   Social History Main Topics  . Smoking status: Former Games developer  . Smokeless tobacco: Never Used  . Alcohol use Yes     Comment: occ  . Drug use: No  . Sexual activity: Not on file    OBJECTIVE:    VS:  HT:5\' 4"  (162.6 cm)   WT:184 lb (83.5 kg)  BMI:31.57    BP:110/76  HR:76bpm  TEMP: ( )  RESP:100 % EXAM: Findings:  Adult female.  No acute respiratory distress but she is holding her head and slight leftward torticollis.  This is mild.  She has limited cervical sidebending to the right with normal flexion, extension, rotation right and left.  She has pain with Spurling's compression test but this localizes only to the neck with no radicular symptoms.  No pain with arm squeeze test or brachial plexus squeeze.  Normal VBS testing, otherwise osteopathic exam per procedure note    RADIOLOGY: n/a  ASSESSMENT & PLAN:     ICD-10-CM   1. Neck pain M54.2 OSTEOPATHIC MANIPULATION TREATMENT    Ibuprofen-Famotidine (DUEXIS) 800-26.6 MG TABS  2. Segmental and somatic dysfunction of cervical region M99.01 OSTEOPATHIC MANIPULATION TREATMENT  3. Segmental and somatic dysfunction of thoracic region M99.02 OSTEOPATHIC MANIPULATION TREATMENT  4. Segmental and somatic dysfunction of rib cage M99.08 OSTEOPATHIC MANIPULATION TREATMENT   ================================================================= Neck pain No radicular symptoms, responded well to osteopathic manipulation.  2 weeks of anti-inflammatories and Goodman exercises.  Follow-up if any lack of improvement for repeat manipulation and consideration of plain film x-rays of her neck.  PROCEDURE NOTE : OSTEOPATHIC MANIPULATION The decision today to treat with Osteopathic Manipulative Therapy (OMT) was based on physical exam findings. Verbal consent was obtained after after explanation of risks, benefits and potential side effects, including acute pain flare, post manipulation soreness and need for repeat treatments.  Additional time was spent discussing the minimal risk of  injury to neurovascular structures for associated Cervical manipulation.  After verbal consent was obtained manipulation was performed as below:            Regions  treated: Per examined regions as below and associated billing codes          Techniques used: [employer], MFR, HVLA and ART The patient tolerated the treatment well and reported Improved symptoms following treatment today. Patient was given medications, exercises, stretches and lifestyle modifications per AVS and verbally.     OSTEOPATHIC/STRUCTURAL EXAM FINDINGS:    C3 FRS right  C7 extended rotated left,  C6 flexed rotated right  T2 through T4 neutral rotated left, side bent right  T6 through T8 neutral rotated right side bent left  Posterior rib 5 on the right  Elevated first right rib  ================================================================= Patient Instructions  Also check out [employer]" which is a program developed by Dr. Myles Lipps.   There are links to a couple of his YouTube Videos below and I would like to see performing one of his videos 5-6 days per week.    A good intro video is: "Independence from Pain 7-minute Video" - [email or web address]   His more advanced video is: "Powerful Posture and Pain Relief: 12 minutes of Foundation Training" -  [email or web address]  Do not try to attempt this entire video when first beginning.    Try breaking of each exercise that he goes into shorter segments.  Otherwise if they perform an exercise for 45 seconds, start with 15 seconds and rest and then resume when they begin the new activity.    If you work your way up to doing this 12 minute video, I expect you will see significant improvements in your pain.  If you enjoy his videos and would like to find out more you can look on his website: [email or web address].  He has a workout streaming option as well as a DVD set available for purchase.  Amazon has the best price for his DVDs.     ================================================================= Future Appointments Date Time Provider Department Center  10/25/2017 4:00 PM  Van Clines, MD [facility] None    Follow-up: Return if symptoms worsen or fail to improve.   CMA/ATC served as Neurosurgeon during this visit. History, Physical, and Plan performed by medical provider. Documentation and orders reviewed and attested to.      Gaspar Bidding, DO    [facility] Physician

## 2017-07-04 MED FILL — ESOMEPRAZOLE MAG DR 40 MG C: 40 | 30 days supply | Qty: 30 | Fill #2

## 2017-07-04 MED FILL — ALPRAZolam 0.5 MG TABS: 0.5 | 30 days supply | Qty: 30 | Fill #1

## 2017-07-04 MED FILL — CYCLOBENZAPRINE 10 MG TAB: 10 | 30 days supply | Qty: 60 | Fill #1

## 2017-07-08 ENCOUNTER — Other Ambulatory Visit: Payer: Self-pay | Admitting: Family Medicine

## 2017-07-08 DIAGNOSIS — Z800 Family history of malignant neoplasm of digestive organs: Secondary | ICD-10-CM

## 2017-07-12 ENCOUNTER — Other Ambulatory Visit: Payer: Self-pay | Admitting: Family Medicine

## 2017-07-12 DIAGNOSIS — R102 Pelvic and perineal pain: Secondary | ICD-10-CM

## 2017-07-12 MED ORDER — HYDROCODONE-ACETAMINOPHEN 5-325 MG PO TABS: 1.0000 | ORAL_TABLET | Freq: Four times a day (QID) | ORAL | 0 refills | Status: DC | PRN

## 2017-07-12 MED FILL — HYDROCODON-APAP 5-325: 5-325 | 7 days supply | Qty: 30 | Fill #0

## 2017-07-25 ENCOUNTER — Other Ambulatory Visit: Payer: Self-pay | Admitting: Family Medicine

## 2017-07-25 MED FILL — PROPRANOLOL 20 MG TABLET: 20 | 90 days supply | Qty: 90 | Fill #1

## 2017-07-26 MED FILL — VIT D2 1.25 MG (50,000 UNIT: 1.25 MG | 84 days supply | Qty: 12 | Fill #1

## 2017-07-26 NOTE — Telephone Encounter (Signed)
Please advise on refill.

## 2017-07-26 NOTE — Telephone Encounter (Signed)
Okay 

## 2017-07-27 ENCOUNTER — Encounter: Payer: Self-pay | Admitting: Obstetrics and Gynecology

## 2017-07-27 ENCOUNTER — Ambulatory Visit (INDEPENDENT_AMBULATORY_CARE_PROVIDER_SITE_OTHER): Payer: 59 | Admitting: Obstetrics and Gynecology

## 2017-07-27 ENCOUNTER — Other Ambulatory Visit: Payer: Self-pay

## 2017-07-27 VITALS — BP 118/70 | HR 84 | Resp 16 | Ht 64.0 in | Wt 185.0 lb

## 2017-07-27 DIAGNOSIS — N946 Dysmenorrhea, unspecified: Secondary | ICD-10-CM | POA: Diagnosis not present

## 2017-07-27 DIAGNOSIS — Z23 Encounter for immunization: Secondary | ICD-10-CM

## 2017-07-27 DIAGNOSIS — Z124 Encounter for screening for malignant neoplasm of cervix: Secondary | ICD-10-CM | POA: Diagnosis not present

## 2017-07-27 DIAGNOSIS — R102 Pelvic and perineal pain: Secondary | ICD-10-CM

## 2017-07-27 DIAGNOSIS — N939 Abnormal uterine and vaginal bleeding, unspecified: Secondary | ICD-10-CM

## 2017-07-27 DIAGNOSIS — E010 Iodine-deficiency related diffuse (endemic) goiter: Secondary | ICD-10-CM

## 2017-07-27 DIAGNOSIS — Z30431 Encounter for routine checking of intrauterine contraceptive device: Secondary | ICD-10-CM

## 2017-07-27 DIAGNOSIS — Z01419 Encounter for gynecological examination (general) (routine) without abnormal findings: Secondary | ICD-10-CM

## 2017-07-27 NOTE — Progress Notes (Signed)
41 y.o. G1P1001 DivorcedCaucasianF here for annual exam.  She has a mirena IUD, placed in 11/16. Prior to the IUD she had heavy cycles and cramps. Since the IUD was placed her cycles have gotten heavier, spots at least 3-4 days a week, she has pelvic pain intermittently. The pelvic pain can be sharp and severe, can last a few seconds to all day. She can have the pain every day for a couple of weeks or go a couple of weeks without the pain. Activity can make her pain worse. Occasional pain with orgasm. Same partner x 2 years.  In addition to the chronic spotting, she has monthly cycles x 3-4 days. She can saturate a pad in 2-3 hours.  Period Duration (Days): 3-4 days  Period Pattern: (!) Irregular Menstrual Flow: Light Menstrual Control: Thin pad, Panty liner Dysmenorrhea: (!) Severe Dysmenorrhea Symptoms: Cramping  Patient's last menstrual period was 07/10/2017.          Sexually active: Yes.    The current method of family planning is IUD.    Exercising: Yes.    playing disc golf 2-3 times a week  Smoker:  Former smoker  Health Maintenance: Pap:  06-27-15 + HPV History of abnormal Pap:  Yes, never had a colposcopy or cervical surgery.  MMG:  04-16-16 needed DX MMG 09/2016 Colonoscopy:  Never BMD:   Never TDaP:  10/2016  Gardasil: No    reports that she has quit smoking. She has a 18.00 pack-year smoking history. she has never used smokeless tobacco. She reports that she drinks about 0.6 - 1.2 oz of alcohol per week. She reports that she does not use drugs. 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 Lives with her partner. Son is 20, in college at [employer], Holiday representativeJunior. Majoring in Lobbyistcomputer science. Also a musician.   Past Medical History:  Diagnosis Date  . Anxiety   . Chronic tension headaches   . Dysmenorrhea   . Neck pain, chronic     Past Surgical History:  Procedure Laterality Date  . DE QUERVAIN'S RELEASE Bilateral   . TONSILLECTOMY    . WRIST SURGERY Left    2 ganglion cyst removed     Current Outpatient Medications  Medication Sig Dispense Refill  . ALPRAZolam (XANAX) 0.5 MG tablet TAKE 1 TABLET BY MOUTH AT BEDTIME AS NEEDED FOR ANXIETY 30 tablet 1  . cyclobenzaprine (FLEXERIL) 10 MG tablet cyclobenzaprine 10 mg tablet  TAKE 1 TABLET BY MOUTH 3 TIMES A DAY AS NEEDED FOR MUSCLE SPASMS    . esomeprazole (NEXIUM) 40 MG capsule Take 1 capsule (40 mg total) by mouth daily. 30 capsule 3  . ibuprofen (ADVIL,MOTRIN) 200 MG tablet Take 200 mg by mouth every 6 (six) hours as needed for pain.    Marland Kitchen. levonorgestrel (MIRENA) 20 MCG/24HR IUD by Intrauterine route.    . Multiple Vitamin (MULTIVITAMIN WITH MINERALS) TABS Take 1 tablet by mouth daily.    . Polyethylene Glycol 3350 (MIRALAX PO) Take by mouth.    . propranolol (INDERAL) 20 MG tablet Take 1 tablet (20 mg total) by mouth daily as needed. 90 tablet 3  . topiramate (TOPAMAX) 50 MG tablet Take 1 tablet (50 mg total) by mouth 2 (two) times daily. 180 tablet 3  . traMADol (ULTRAM) 50 MG tablet TAKE 1 TABLET BY MOUTH TWICE DAILY 180 tablet 0   No current facility-administered medications for this visit.     History reviewed. No pertinent family history.  Review of Systems  Constitutional: Negative.   HENT: Negative.   Eyes: Negative.   Respiratory: Negative.   Cardiovascular: Negative.   Gastrointestinal: Positive for abdominal pain.  Endocrine: Negative.   Genitourinary: Positive for menstrual problem.       Irregular menstrual spotting and cramping   Musculoskeletal: Negative.   Skin: Negative.   Allergic/Immunologic: Negative.   Neurological: Negative.   Psychiatric/Behavioral: Negative.     Exam:   BP 118/70 (BP Location: Right Arm, Patient Position: Sitting, Cuff Size: Normal)   Pulse 84   Resp 16   Ht 5\' 4"  (1.626 m)   Wt 185 lb (83.9 kg)   LMP 07/10/2017   BMI 31.76 kg/m   Weight change: @WEIGHTCHANGE @ Height:   Height: 5\' 4"  (162.6 cm)  Ht Readings from Last 3 Encounters:  07/27/17 5\' 4"  (1.626 m)   06/28/17 5\' 4"  (1.626 m)  06/13/17 5\' 4"  (1.626 m)    General appearance: alert, cooperative and appears stated age Head: Normocephalic, without obvious abnormality, atraumatic Neck: no adenopathy, supple, symmetrical, trachea midline and thyroid ? small nodule on the right lobe Lungs: clear to auscultation bilaterally Cardiovascular: regular rate and rhythm Breasts: normal appearance, no masses or tenderness Abdomen: soft, non-tender; non distended,  no masses,  no organomegaly Extremities: extremities normal, atraumatic, no cyanosis or edema Skin: Skin color, texture, turgor normal. No rashes or lesions Lymph nodes: Cervical, supraclavicular, and axillary nodes normal. No abnormal inguinal nodes palpated Neurologic: Grossly normal   Pelvic: External genitalia:  no lesions              Urethra:  normal appearing urethra with no masses, tenderness or lesions              Bartholins and Skenes: normal                 Vagina: normal appearing vagina with normal color and discharge, no lesions              Cervix: no lesions and IUD string 1 cm               Bimanual Exam:  Uterus:  normal size, contour, position, consistency, mobility, non-tender and retroverted              Adnexa: no mass, fullness, tenderness               Rectovaginal: Confirms               Anus:  normal sphincter tone, no lesions  Chaperone was present for exam.  A:  Well Woman with normal exam  H/O +HPV  ? Right thyroid nodule  Pelvic pain  Abnormal bleeding with the IUD  Worsening dysmenorrhea  P:   Pap with hpv, GC/Chlamydia  Return for a GYN ultrasound, discussed the option of pulling the IUD today or waiting and seeing if it is in proper location on ultrasound. If not we could pull it and place another one  Discussed the option of OCP's, no contraindications, risks reviewed. Discussed that the Topamax could decrease the effectiveness   Will set up a thyroid ultrasound  Reviewed the new guidelines  for the Gardasil, will start the series today, she may finish it with her primary  Mammogram UTD

## 2017-07-27 NOTE — Patient Instructions (Addendum)
Oral Contraception Information Oral contraceptive pills (OCPs) are medicines taken to prevent pregnancy. OCPs work by preventing the ovaries from releasing eggs. The hormones in OCPs also cause the cervical mucus to thicken, preventing the sperm from entering the uterus. The hormones also cause the uterine lining to become thin, not allowing a fertilized egg to attach to the inside of the uterus. OCPs are highly effective when taken exactly as prescribed. However, OCPs do not prevent sexually transmitted diseases (STDs). Safe sex practices, such as using condoms along with the pill, can help prevent STDs. Before taking the pill, you may have a physical exam and Pap test. Your health care provider may order blood tests. The health care provider will make sure you are a good candidate for oral contraception. Discuss with your health care provider the possible side effects of the OCP you may be prescribed. When starting an OCP, it can take 2 to 3 months for the body to adjust to the changes in hormone levels in your body. Types of oral contraception  The combination pill-This pill contains estrogen and progestin (synthetic progesterone) hormones. The combination pill comes in 21-day, 28-day, or 91-day packs. Some types of combination pills are meant to be taken continuously (365-day pills). With 21-day packs, you do not take pills for 7 days after the last pill. With 28-day packs, the pill is taken every day. The last 7 pills are without hormones. Certain types of pills have more than 21 hormone-containing pills. With 91-day packs, the first 84 pills contain both hormones, and the last 7 pills contain no hormones or contain estrogen only.  The minipill-This pill contains the progesterone hormone only. The pill is taken every day continuously. It is very important to take the pill at the same time each day. The minipill comes in packs of 28 pills. All 28 pills contain the hormone. Advantages of oral  contraceptive pills  Decreases premenstrual symptoms.  Treats menstrual period cramps.  Regulates the menstrual cycle.  Decreases a heavy menstrual flow.  May treatacne, depending on the type of pill.  Treats abnormal uterine bleeding.  Treats polycystic ovarian syndrome.  Treats endometriosis.  Can be used as emergency contraception. Things that can make oral contraceptive pills less effective OCPs can be less effective if:  You forget to take the pill at the same time every day.  You have a stomach or intestinal disease that lessens the absorption of the pill.  You take OCPs with other medicines that make OCPs less effective, such as antibiotics, certain HIV medicines, and some seizure medicines.  You take expired OCPs.  You forget to restart the pill on day 7, when using the packs of 21 pills.  Risks associated with oral contraceptive pills Oral contraceptive pills can sometimes cause side effects, such as:  Headache.  Nausea.  Breast tenderness.  Irregular bleeding or spotting.  Combination pills are also associated with a small increased risk of:  Blood clots.  Heart attack.  Stroke.  This information is not intended to replace advice given to you by your health care provider. Make sure you discuss any questions you have with your health care provider. Document Released: 11/20/2002 Document Revised: 02/05/2016 Document Reviewed: 02/18/2013 Elsevier Interactive Patient Education  2018 [place] AND DIET:  We recommended that you start or continue a regular exercise program for good health. Regular exercise means any activity that makes your heart beat faster and makes you sweat.  We recommend exercising at least 30 minutes  per day at least 3 days a week, preferably 4 or 5.  We also recommend a diet low in fat and sugar.  Inactivity, poor dietary choices and obesity can cause diabetes, heart attack, stroke, and kidney damage, among others.     ALCOHOL AND SMOKING:  Women should limit their alcohol intake to no more than 7 drinks/beers/glasses of wine (combined, not each!) per week. Moderation of alcohol intake to this level decreases your risk of breast cancer and liver damage. And of course, no recreational drugs are part of a healthy lifestyle.  And absolutely no smoking or even second hand smoke. Most people know smoking can cause heart and lung diseases, but did you know it also contributes to weakening of your bones? Aging of your skin?  Yellowing of your teeth and nails?  CALCIUM AND VITAMIN D:  Adequate intake of calcium and Vitamin D are recommended.  The recommendations for exact amounts of these supplements seem to change often, but generally speaking 600 mg of calcium (either carbonate or citrate) and 800 units of Vitamin D per day seems prudent. Certain women may benefit from higher intake of Vitamin D.  If you are among these women, your doctor will have told you during your visit.    PAP SMEARS:  Pap smears, to check for cervical cancer or precancers,  have traditionally been done yearly, although recent scientific advances have shown that most women can have pap smears less often.  However, every woman still should have a physical exam from her gynecologist every year. It will include a breast check, inspection of the vulva and vagina to check for abnormal growths or skin changes, a visual exam of the cervix, and then an exam to evaluate the size and shape of the uterus and ovaries.  And after 42 years of age, a rectal exam is indicated to check for rectal cancers. We will also provide age appropriate advice regarding health maintenance, like when you should have certain vaccines, screening for sexually transmitted diseases, bone density testing, colonoscopy, mammograms, etc.   MAMMOGRAMS:  All women over 42 years old should have a yearly mammogram. Many facilities now offer a "3D" mammogram, which may cost around $50 extra out of  pocket. If possible,  we recommend you accept the option to have the 3D mammogram performed.  It both reduces the number of women who will be called back for extra views which then turn out to be normal, and it is better than the routine mammogram at detecting truly abnormal areas.    COLONOSCOPY:  Colonoscopy to screen for colon cancer is recommended for all women at age 10650.  We know, you hate the idea of the prep.  We agree, BUT, having colon cancer and not knowing it is worse!!  Colon cancer so often starts as a polyp that can be seen and removed at colonscopy, which can quite literally save your life!  And if your first colonoscopy is normal and you have no family history of colon cancer, most women don't have to have it again for 10 years.  Once every ten years, you can do something that may end up saving your life, right?  We will be happy to help you get it scheduled when you are ready.  Be sure to check your insurance coverage so you understand how much it will cost.  It may be covered as a preventative service at no cost, but you should check your particular policy.  Breast Self-Awareness Breast self-awareness means being familiar with how your breasts look and feel. It involves checking your breasts regularly and reporting any changes to your health care provider. Practicing breast self-awareness is important. A change in your breasts can be a sign of a serious medical problem. Being familiar with how your breasts look and feel allows you to find any problems early, when treatment is more likely to be successful. All women should practice breast self-awareness, including women who have had breast implants. How to do a breast self-exam One way to learn what is normal for your breasts and whether your breasts are changing is to do a breast self-exam. To do a breast self-exam: Look for Changes  1. Remove all the clothing above your waist. 2. Stand in front of a mirror in a room with good  lighting. 3. Put your hands on your hips. 4. Push your hands firmly downward. 5. Compare your breasts in the mirror. Look for differences between them (asymmetry), such as: ? Differences in shape. ? Differences in size. ? Puckers, dips, and bumps in one breast and not the other. 6. Look at each breast for changes in your skin, such as: ? Redness. ? Scaly areas. 7. Look for changes in your nipples, such as: ? Discharge. ? Bleeding. ? Dimpling. ? Redness. ? A change in position. Feel for Changes  Carefully feel your breasts for lumps and changes. It is best to do this while lying on your back on the floor and again while sitting or standing in the shower or tub with soapy water on your skin. Feel each breast in the following way:  Place the arm on the side of the breast you are examining above your head.  Feel your breast with the other hand.  Start in the nipple area and make  inch (2 cm) overlapping circles to feel your breast. Use the pads of your three middle fingers to do this. Apply light pressure, then medium pressure, then firm pressure. The light pressure will allow you to feel the tissue closest to the skin. The medium pressure will allow you to feel the tissue that is a little deeper. The firm pressure will allow you to feel the tissue close to the ribs.  Continue the overlapping circles, moving downward over the breast until you feel your ribs below your breast.  Move one finger-width toward the center of the body. Continue to use the  inch (2 cm) overlapping circles to feel your breast as you move slowly up toward your collarbone.  Continue the up and down exam using all three pressures until you reach your armpit.  Write Down What You Find  Write down what is normal for each breast and any changes that you find. Keep a written record with breast changes or normal findings for each breast. By writing this information down, you do not need to depend only on memory for  size, tenderness, or location. Write down where you are in your menstrual cycle, if you are still menstruating. If you are having trouble noticing differences in your breasts, do not get discouraged. With time you will become more familiar with the variations in your breasts and more comfortable with the exam. How often should I examine my breasts? Examine your breasts every month. If you are breastfeeding, the best time to examine your breasts is after a feeding or after using a breast pump. If you menstruate, the best time to examine your breasts is 5-7 days after  your period is over. During your period, your breasts are lumpier, and it may be more difficult to notice changes. When should I see my health care provider? See your health care provider if you notice:  A change in shape or size of your breasts or nipples.  A change in the skin of your breast or nipples, such as a reddened or scaly area.  Unusual discharge from your nipples.  A lump or thick area that was not there before.  Pain in your breasts.  Anything that concerns you.  This information is not intended to replace advice given to you by your health care provider. Make sure you discuss any questions you have with your health care provider. Document Released: 08/30/2005 Document Revised: 02/05/2016 Document Reviewed: 07/20/2015 Elsevier Interactive Patient Education  [employer].

## 2017-08-01 ENCOUNTER — Other Ambulatory Visit (HOSPITAL_COMMUNITY)
Admission: RE | Admit: 2017-08-01 | Discharge: 2017-08-01 | Disposition: A | Discharge status: Home / Self Care | Payer: 59 | Source: Ambulatory Visit | Attending: Obstetrics and Gynecology | Admitting: Obstetrics and Gynecology

## 2017-08-01 ENCOUNTER — Other Ambulatory Visit: Payer: Self-pay | Admitting: *Deleted

## 2017-08-01 DIAGNOSIS — E010 Iodine-deficiency related diffuse (endemic) goiter: Secondary | ICD-10-CM

## 2017-08-01 DIAGNOSIS — Z124 Encounter for screening for malignant neoplasm of cervix: Secondary | ICD-10-CM | POA: Insufficient documentation

## 2017-08-01 NOTE — Addendum Note (Signed)
Addended by: Tobi Bastos on: 08/01/2017 05:41 PM   Modules accepted: Orders

## 2017-08-02 ENCOUNTER — Telehealth: Payer: Self-pay | Admitting: Obstetrics and Gynecology

## 2017-08-02 MED FILL — ESOMEPRAZOLE MAG DR 40 MG C: 40 | 30 days supply | Qty: 30 | Fill #3

## 2017-08-02 NOTE — Telephone Encounter (Signed)
Call placed to patient to review benefits for a recommended ultrasound. Left voicemail message requesting a return call. °

## 2017-08-03 MED FILL — traMADol HCL 50 MG TABS: 50 | 90 days supply | Qty: 180 | Fill #0

## 2017-08-08 ENCOUNTER — Other Ambulatory Visit: Payer: Self-pay | Admitting: Family Medicine

## 2017-08-08 LAB — CYTOLOGY - PAP
Diagnosis: NEGATIVE
HPV: DETECTED — AB

## 2017-08-08 NOTE — Telephone Encounter (Signed)
Please advise on refill.

## 2017-08-08 NOTE — Telephone Encounter (Signed)
Patient returned call. Reviewed benefits for ultrasound. Patient understood and agreeable. Patient ready to schedule. Patient scheduled 08/16/17 with Dr Oscar La. Patient aware of appointment date, arrival time and cancellation policy. No further questions. Ok to close

## 2017-08-08 NOTE — Telephone Encounter (Signed)
Second called placed to patient to review benefits for a recommended ultrasound. Left voicemail message requesting a return call.

## 2017-08-10 ENCOUNTER — Telehealth: Payer: Self-pay | Admitting: *Deleted

## 2017-08-10 MED FILL — ALPRAZolam 0.5 MG TABS: 0.5 | 30 days supply | Qty: 30 | Fill #0

## 2017-08-10 NOTE — Telephone Encounter (Signed)
Notes recorded by Leda MinHamm, Nil Bolser N, RN on 08/10/2017 at 1:41 PM EST Left message to call Noreene LarssonJill at [phone]. See telephone encounter dated 08/10/17.   ------  Notes recorded by Romualdo BolkJertson, Nikeria Kalman Evelyn, MD on 08/10/2017 at 12:42 PM EST Please let the patient know that her pap was negative, but + for HPV. She needs a colposcopy. 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

## 2017-08-15 ENCOUNTER — Telehealth: Payer: Self-pay | Admitting: *Deleted

## 2017-08-15 NOTE — Telephone Encounter (Signed)
Left message to call [name] at [phone].    08/10/17 1:41 PM  Note  Notes recorded by Leda Min, RN on 08/10/2017 at 1:41 PM EST Left message to call [name] at [phone]. See telephone encounter dated 08/10/17.   ------  Notes recorded by Romualdo Bolk, MD on 08/10/2017 at 12:42 PM EST Please let the patient know that her pap was negative, but + for HPV. She needs a colposcopy. 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

## 2017-08-15 NOTE — Telephone Encounter (Signed)
Encounter closed in error, see telephone encounter dated 08/15/17.

## 2017-08-16 ENCOUNTER — Ambulatory Visit: Payer: 59 | Admitting: Obstetrics and Gynecology

## 2017-08-16 ENCOUNTER — Ambulatory Visit (INDEPENDENT_AMBULATORY_CARE_PROVIDER_SITE_OTHER): Payer: 59

## 2017-08-16 ENCOUNTER — Other Ambulatory Visit: Payer: Self-pay

## 2017-08-16 ENCOUNTER — Other Ambulatory Visit: Payer: Self-pay | Admitting: Obstetrics and Gynecology

## 2017-08-16 ENCOUNTER — Encounter: Payer: Self-pay | Admitting: Obstetrics and Gynecology

## 2017-08-16 VITALS — BP 110/60 | HR 84 | Resp 18 | Wt 190.0 lb

## 2017-08-16 DIAGNOSIS — N83201 Unspecified ovarian cyst, right side: Secondary | ICD-10-CM | POA: Diagnosis not present

## 2017-08-16 DIAGNOSIS — Z30431 Encounter for routine checking of intrauterine contraceptive device: Secondary | ICD-10-CM

## 2017-08-16 DIAGNOSIS — R102 Pelvic and perineal pain unspecified side: Secondary | ICD-10-CM

## 2017-08-16 DIAGNOSIS — N946 Dysmenorrhea, unspecified: Secondary | ICD-10-CM | POA: Diagnosis not present

## 2017-08-16 DIAGNOSIS — N939 Abnormal uterine and vaginal bleeding, unspecified: Secondary | ICD-10-CM

## 2017-08-16 DIAGNOSIS — N83202 Unspecified ovarian cyst, left side: Secondary | ICD-10-CM | POA: Diagnosis not present

## 2017-08-16 NOTE — Progress Notes (Signed)
GYNECOLOGY  VISIT   HPI: 41 y.o.   Divorced  Caucasian  female   G1P1001 with No LMP recorded.   here for follow up pelvic pain and irregular bleeding    She got the mirena for cycle control and cramps. Since the IUD was inserted she has been having lots of AUB and worsening severe, intermittent pelvic pain. The pain is on one side or the other, typically last for 30 seconds or less, terrible pain. That pain occurs a couple of times a month. She also gets cramping with bleeding, not improved with the IUD.   GYNECOLOGIC HISTORY: No LMP recorded. Contraception:IUD Menopausal hormone therapy: none         OB History    Gravida Para Term Preterm AB Living   1 1 1     1    SAB TAB Ectopic Multiple Live Births           1         Patient Active Problem List   Diagnosis Date Noted  . Pelvic pain 07/12/2017  . Neck pain 06/13/2017  . Carpal tunnel syndrome 11/16/2016  . MRI of brain abnormal 08/23/2016  . Tinnitus, bilateral 07/27/2016  . Low serum vitamin D 11/11/2015  . Generalized anxiety disorder 12/18/2014    Past Medical History:  Diagnosis Date  . Anxiety   . Chronic tension headaches   . Dysmenorrhea   . Neck pain, chronic     Past Surgical History:  Procedure Laterality Date  . DE QUERVAIN'S RELEASE Bilateral   . TONSILLECTOMY    . WRIST SURGERY Left    2 ganglion cyst removed    Current Outpatient Medications  Medication Sig Dispense Refill  . ALPRAZolam (XANAX) 0.5 MG tablet TAKE 1 TABLET BY MOUTH AT BEDTIME AS NEEDED FOR ANXIETY 30 tablet 1  . cyclobenzaprine (FLEXERIL) 10 MG tablet cyclobenzaprine 10 mg tablet  TAKE 1 TABLET BY MOUTH 3 TIMES A DAY AS NEEDED FOR MUSCLE SPASMS    . esomeprazole (NEXIUM) 40 MG capsule Take 1 capsule (40 mg total) by mouth daily. 30 capsule 3  . ibuprofen (ADVIL,MOTRIN) 200 MG tablet Take 200 mg by mouth every 6 (six) hours as needed for pain.    Marland Kitchen. levonorgestrel (MIRENA) 20 MCG/24HR IUD by Intrauterine route.    . Multiple  Vitamin (MULTIVITAMIN WITH MINERALS) TABS Take 1 tablet by mouth daily.    . Polyethylene Glycol 3350 (MIRALAX PO) Take by mouth.    . propranolol (INDERAL) 20 MG tablet Take 1 tablet (20 mg total) by mouth daily as needed. 90 tablet 3  . topiramate (TOPAMAX) 50 MG tablet Take 1 tablet (50 mg total) by mouth 2 (two) times daily. 180 tablet 3  . traMADol (ULTRAM) 50 MG tablet TAKE 1 TABLET BY MOUTH TWICE DAILY 180 tablet 0   No current facility-administered medications for this visit.      ALLERGIES: Codeine and Other  History reviewed. No pertinent family history.  Social History   Socioeconomic History  . Marital status: Divorced    Spouse name: Not on file  . Number of children: Not on file  . Years of education: Not on file  . Highest education level: Not on file  Social Needs  . Financial resource strain: Not on file  . Food insecurity - worry: Not on file  . Food insecurity - inability: Not on file  . Transportation needs - medical: Not on file  . Transportation needs - non-medical: Not on file  Occupational History  . Not on file  Tobacco Use  . Smoking status: Former Smoker    Packs/day: 18.00    Years: 1.00    Pack years: 18.00  . Smokeless tobacco: Never Used  Substance and Sexual Activity  . Alcohol use: Yes    Alcohol/week: 0.6 - 1.2 oz    Types: 1 - 2 Standard drinks or equivalent per week  . Drug use: No  . Sexual activity: Yes    Partners: Male    Birth control/protection: IUD  Other Topics Concern  . Not on file  Social History Narrative  . Not on file    Review of Systems  Constitutional: Negative.   HENT: Negative.   Respiratory: Negative.   Gastrointestinal: Negative.   Genitourinary:       Pelvic pain  Irregular menstrual bleeding   Musculoskeletal: Negative.   Skin: Negative.   Neurological: Negative.   Endo/Heme/Allergies: Negative.   Psychiatric/Behavioral: Negative.     PHYSICAL EXAMINATION:    BP 110/60 (BP Location: Right Arm,  Patient Position: Sitting, Cuff Size: Normal)   Pulse 84   Resp 18   Wt 190 lb (86.2 kg)   BMI 32.61 kg/m     General appearance: alert, cooperative and appears stated age  Pelvic: External genitalia:  no lesions              Urethra:  normal appearing urethra with no masses, tenderness or lesions              Bartholins and Skenes: normal                 Vagina: normal appearing vagina with normal color and discharge, no lesions              Cervix:  No lesions  Sonohysterogram The procedure and risks of the procedure were reviewed with the patient, consent form was signed. A speculum was placed in the vagina and the cervix was cleansed with betadine. The sonohysterogram catheter was inserted into the uterine cavity without difficulty. Saline was infused under direct observation with the ultrasound. A large endometrial polyp was noted, the IUD was in place. The cavity was otherwise thin.The catheter was removed.   Chaperone was present for exam.  Ultrasound images reviewed with the patient  ASSESSMENT AUB with the mirena Cramps, not helped with the mirena Bilateral simple ovarian cysts Pelvic pain, intermittent, suspect from cysts    PLAN Plan: hysteroscopy, polypectomy, dilation and curettage with IUD removal in the OR. Reviewed risks, including: bleeding, infection, uterine perforation, fluid overload, need for further sugery Given her pain issues with the IUD we discussed starting OCP's after surgery. No contraindications.  We did discuss that Topamax can decrease the effectiveness of OCP's and recommend a back up method of contraception    An After Visit Summary was printed and given to the patient.  ~15 minutes face to face time of which over 50% was spent in counseling.

## 2017-08-16 NOTE — Telephone Encounter (Signed)
Patient seen in office on 08/16/17, colposcopy scheduled on 08/24/17.   Routing to provider for final review. Patient is agreeable to disposition. Will close encounter.

## 2017-08-17 MED FILL — CYCLOBENZAPRINE 10 MG TAB: 10 | 30 days supply | Qty: 60 | Fill #2

## 2017-08-23 MED FILL — ALPRAZolam 0.25 MG TABS: 0.25 | 30 days supply | Qty: 30 | Fill #0

## 2017-08-23 MED FILL — TOPIRAMATE 50 MG TABLET: 50 | 90 days supply | Qty: 180 | Fill #1

## 2017-08-24 ENCOUNTER — Ambulatory Visit: Payer: 59 | Admitting: Obstetrics and Gynecology

## 2017-08-24 ENCOUNTER — Encounter: Payer: Self-pay | Admitting: Obstetrics and Gynecology

## 2017-08-24 ENCOUNTER — Telehealth: Payer: Self-pay | Admitting: *Deleted
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
## 2017-08-24 NOTE — Telephone Encounter (Signed)
See MyChart message to patient, appointment cancelled.

## 2017-08-25 ENCOUNTER — Other Ambulatory Visit: Payer: Self-pay | Admitting: Family Medicine

## 2017-08-25 MED ORDER — AMOXICILLIN 875 MG PO TABS: 875.0000 mg | ORAL_TABLET | Freq: Two times a day (BID) | ORAL | 0 refills | Status: DC

## 2017-08-25 MED FILL — AMOXICILLIN 875 MG TABLET: 875 | 10 days supply | Qty: 20 | Fill #0

## 2017-08-29 ENCOUNTER — Ambulatory Visit (INDEPENDENT_AMBULATORY_CARE_PROVIDER_SITE_OTHER): Payer: 59 | Admitting: Family Medicine

## 2017-08-29 DIAGNOSIS — R1032 Left lower quadrant pain: Secondary | ICD-10-CM

## 2017-08-29 MED ORDER — HYDROCODONE-ACETAMINOPHEN 5-325 MG PO TABS: 1.0000 | ORAL_TABLET | Freq: Four times a day (QID) | ORAL | 0 refills | Status: DC | PRN

## 2017-08-29 MED ORDER — KETOROLAC TROMETHAMINE 10 MG PO TABS: 10.0000 mg | ORAL_TABLET | Freq: Four times a day (QID) | ORAL | 0 refills | Status: DC | PRN

## 2017-08-29 MED ORDER — KETOROLAC TROMETHAMINE 60 MG/2ML IM SOLN
60.0000 mg | Freq: Once | INTRAMUSCULAR | Status: AC
  Administered 2017-08-29: 60 mg via INTRAMUSCULAR

## 2017-08-29 NOTE — Progress Notes (Signed)
   [name] is a 41 y.o. female here for an acute visit.  History of Present Illness:   HPI: See Assessment and Plan section for Problem Based Charting of issues discussed today.  PMHx, SurgHx, SocialHx, Medications, and Allergies were reviewed in the Visit Navigator and updated as appropriate.  Current Medications:   .  ALPRAZolam (XANAX) 0.5 MG tablet, TAKE 1 TABLET BY MOUTH AT BEDTIME AS NEEDED FOR ANXIETY, Disp: 30 tablet, Rfl: 1 .  amoxicillin (AMOXIL) 875 MG tablet, Take 1 tablet (875 mg total) by mouth 2 (two) times daily., Disp: 20 tablet, Rfl: 0 .  cyclobenzaprine (FLEXERIL) 10 MG tablet, cyclobenzaprine 10 mg tablet  TAKE 1 TABLET BY MOUTH 3 TIMES A DAY AS NEEDED FOR MUSCLE SPASMS, Disp: , Rfl:  .  esomeprazole (NEXIUM) 40 MG capsule, Take 1 capsule (40 mg total) by mouth daily., Disp: 30 capsule, Rfl: 3 .  ibuprofen (ADVIL,MOTRIN) 200 MG tablet, Take 200 mg by mouth every 6 (six) hours as needed for pain., Disp: , Rfl:  .  levonorgestrel (MIRENA) 20 MCG/24HR IUD, by Intrauterine route., Disp: , Rfl:  .  Multiple Vitamin (MULTIVITAMIN WITH MINERALS) TABS, Take 1 tablet by mouth daily., Disp: , Rfl:  .  Polyethylene Glycol 3350 (MIRALAX PO), Take by mouth., Disp: , Rfl:  .  propranolol (INDERAL) 20 MG tablet, Take 1 tablet (20 mg total) by mouth daily as needed., Disp: 90 tablet, Rfl: 3 .  topiramate (TOPAMAX) 50 MG tablet, Take 1 tablet (50 mg total) by mouth 2 (two) times daily., Disp: 180 tablet, Rfl: 3 .  traMADol (ULTRAM) 50 MG tablet, TAKE 1 TABLET BY MOUTH TWICE DAILY, Disp: 180 tablet, Rfl: 0   Allergies  Allergen Reactions  . Codeine Nausea Only  . Other Other (See Comments)    Anxious - couldn't sit still (brompheniramine DM)   Review of Systems:   Pertinent items are noted in the HPI. Otherwise, ROS is negative.  Assessment and Plan:   Diagnoses and all orders for this visit:  Left lower quadrant pain Comments: Carolyna presents with left lower abdominal  pelvic pain today this is ongoing but worsened over she has a known left ovarian cyst.  She is currently followed by GYN.  She denies any fevers, nausea, vomiting, diarrhea, constipation, dysuria.  She is a CMA in our clinic.  Okay for Toradol injection this morning.  Will monitor her throughout the day. Orders: -     ketorolac (TORADOL) injection 60 mg  . Reviewed expectations re: course of current medical issues. . Discussed self-management of symptoms. . Outlined signs and symptoms indicating need for more acute intervention. . Patient verbalized understanding and all questions were answered. Marland Kitchen. Health Maintenance issues including appropriate healthy diet, exercise, and smoking avoidance were discussed with patient. . See orders for this visit as documented in the electronic medical record. . Patient received an After Visit Summary.  Helane RimaErica Sanjeev Main, DO [facility] 08/29/2017  Future Appointments  Date Time Provider Department Center  10/25/2017  4:00 PM Van ClinesAquino, Karen M, MD [facility] None  08/02/2018  3:30 PM Romualdo BolkJertson, Jill Evelyn, MD [facility] None

## 2017-08-29 NOTE — Addendum Note (Signed)
Addended by: Helane Rima R on: 08/29/2017 04:19 PM   Modules accepted: Orders

## 2017-08-31 NOTE — Telephone Encounter (Signed)
Call placed to patient to review benefits for a recommended surgical procedure. Left voicemail message requesting a return call.

## 2017-09-08 NOTE — Telephone Encounter (Signed)
Left message to call Yanil Dawe at [phone].  

## 2017-09-09 ENCOUNTER — Encounter: Payer: Self-pay | Admitting: Family Medicine

## 2017-09-11 MED FILL — ALPRAZolam 0.5 MG TABS: 0.5 | 30 days supply | Qty: 30 | Fill #1

## 2017-09-14 ENCOUNTER — Encounter: Payer: Self-pay | Admitting: Obstetrics and Gynecology

## 2017-09-14 ENCOUNTER — Telehealth: Payer: Self-pay | Admitting: Obstetrics and Gynecology

## 2017-09-14 NOTE — Telephone Encounter (Signed)
-----   Message from Mychart, Generic sent at 09/14/2017 12:42 PM EST -----    Good Afternoon,    I need to get scheduled to have a colposcopy and also to have a surgical procedure to remove a uterine polyp. I know you have been trying to get in touch with me. Due to the holidays and a major change at my job, it has been impossible for me to answer the phone when you have tried to reach me. I have a couple of questions.    Is there any way I can work on the scheduling aspect of this through [employer] (cost, etc.)? It is much easier to do while I am at work and cannot be on the phone. I haven't been getting home until 6:30-7:00 every night lately.    Is it possible to do the colposcopy at the same time as the surgery? (probably not, just thought I'd ask) And if not, is it possible to go ahead with the surgery first? I am miserable and really want to get this over with.    Also, my insurance changed as of 09/13/2017. I now have the new [employer] plan. My member ID number is [id number]    Sorry for all this! I hope it's not too confusing!    [name]

## 2017-09-14 NOTE — Telephone Encounter (Signed)
Message reviewed with Dr Oscar La and response to patient thru My Chart as requested by patient.

## 2017-09-16 ENCOUNTER — Telehealth: Payer: Self-pay | Admitting: Obstetrics and Gynecology

## 2017-09-16 NOTE — Telephone Encounter (Signed)
-----   Message from Mychart, Generic sent at 09/16/2017 10:09 AM EST -----    I will proceed in whatever way you feel is best. As far as colposcopy goes (if we are doing that first), my availability is Wednesday afternoons after 2:30 because Wednesday's are my half days at work. My last period was 09/08/17, and I'm just using the IUD right now for contraception but will be switching to the pill after the surgery.  I thank you for responding to me this way. Things really went crazy at work and this is the best way to contact me during the day for the time being.     As far as the surgery, I will just have to make it work depending on when Dr. Oscar La does her surgeries. My job is aware that I am having some health issues and will need to be out for a couple of days for a procedure.   Also, please let Dr. Oscar La know I am having some significant cramping/stabbing pain on the left side today. Possible cyst? I've tried tramadol with no luck. PCP wrote Norco a few weeks ago, but It doesn't help much either. Any other suggestions? I'll be glad when this is over!   ----- Message -----  From: Nurse [name]  Sent: 09/14/2017 4:22 PM EST  To: [name]  Subject: Surgery scheduling  [name],    We received your My Chart Message and are responding as you requested.  Dr Oscar La said that we can schedule the colposcopy with the surgery; however, there is the possibility that the colposcopy results would indicate the need for an additional procedure. This additional procedure would be the preferred procedure to be combined with the surgery versus the colposcopy, which is a lesser procedure comparatively. Since your Pap smear was performed back in November, we do need to proceed with the colposcopy fairly soon. We could then schedule your Hysteroscopy/D&C with IUD removal as well as any cervical procedure for the week after the colposcopy.   We are currently checking on your new insurance benefits. Thomasene Lot  will get back to you as soon as she gets this information.    Please let me know which way you would like to proceed with scheduling and what your available dates are. I also need to know when your last period was and what method of contraception you are using.     Thank you,    Billie Ruddy, RN   Clinical Supervisor

## 2017-09-16 NOTE — Telephone Encounter (Signed)
My Chart message reviewed and call to patient to assess pain. Per ROI, can leave message on voice mail which has number confirmation.  Left message that with pain she is describing, wanted to be able to advise MD on call over weekend. If increases, can always go to MAU or ED of choice.  Advised will send My Chart message as well to get appointment scheduled.    Routing to Dr Oscar La for review.

## 2017-09-19 ENCOUNTER — Ambulatory Visit: Payer: Self-pay | Admitting: Obstetrics and Gynecology

## 2017-09-19 ENCOUNTER — Other Ambulatory Visit: Payer: Self-pay | Admitting: Surgical

## 2017-09-19 DIAGNOSIS — K219 Gastro-esophageal reflux disease without esophagitis: Secondary | ICD-10-CM

## 2017-09-19 MED ORDER — ESOMEPRAZOLE MAGNESIUM 40 MG PO CPDR: 40.0000 mg | DELAYED_RELEASE_CAPSULE | Freq: Every day | ORAL | 1 refills | Status: DC

## 2017-09-19 MED FILL — ESOMEPRAZOLE MAG DR 40 MG C: 40 | 90 days supply | Qty: 90 | Fill #0

## 2017-09-19 NOTE — Telephone Encounter (Signed)
Patient sent the following message through MyChart cancelling her appointment for a possible colposcopy today with Dr. Oscar La. Routing to triage for rescheduling.  ----- Message from Mychart, Generic sent at 09/18/2017 2:54 PM EST -----    Im so sorry [name], I will not be able to come in at that time. The pain has lessened somewhat over the weekend with rest and medicine. I know its the cysts causing the pain. Just wish there was something a little stronger that could help.     I would be happy to come in for the colposcopy, but do you have anything on a Wednesday afternoon at 2:30 or later? I promise Im not trying to be difficult! Its just the situation with my job...ugh.     Thank you for everything youre doing to help me!    The surgery date will be easier to plan once the colposcopy is finished, and my work is aware I will be needing time off for a surgical procedure.   ----- Message -----  From: Nurse [name]  Sent: 09/16/2017 5:21 PM EST  To: Lars Mage Wentzel  Subject: Follow-up  [name],    I am sorry for the delay in responding. Anytime you are having pain, you should always call the office as we will be able to respond to phone calls faster than My Chart messages. I tried to call you this afternoon but was unable to reach you. If your pain is not responding to Tramadol and/or Norco, I would say we should see you in the office. I would like to schedule you to see Dr Oscar La on Monday morning, January 7, at 10:30 am. If your pain is improved and Dr Oscar La agrees, we can plan to proceed with the colposcopy while you are here. If your pain worsens over the weekend, you can go to urgent care, the [facility] or the ER of your choice. We have a doctor on call all weekend if needed.   As far as the surgical procedure, we can possibly plan for January 22 or 28 pending the colposcopy on Monday.

## 2017-09-19 NOTE — Telephone Encounter (Signed)
Return My Chart message to patient.

## 2017-09-19 NOTE — Telephone Encounter (Signed)
Colpo scheduled for 09-28-17 as discussed and message to patient.  Encounter closed.

## 2017-09-19 NOTE — Telephone Encounter (Signed)
Please schedule her for January 16

## 2017-09-19 NOTE — Telephone Encounter (Signed)
Routing to S. Yeakley, RN.  

## 2017-09-19 NOTE — Telephone Encounter (Signed)
-----   Message from Mychart, Generic sent at 09/19/2017 1:17 PM EST -----    It seems like I am being difficult, but I promise I'm not trying to be. Until a few weeks ago, my schedule was more flexible. 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 If I'm not here, he has no nurse, and we are short staffed to begin with. The only day he doesn't see that many patients is Wednesdays, when he typically leaves at noon, so I could actually leave for an appt anytime after 12:30 on Wednesdays. Other than that, the only solution would be the absolute latest possible appt available (4:00 or after?) on other days. I would hopefully be able to get my clinical team lead to cover for the last couple of patients so I could make it for an appt. Believe me, I want to get this done. I want to move forward with surgery. Running the schedule I'm running is making all my symptoms worse. I was feeling great this morning and now I'm back to having pain again.  Thank you, again, for helping me so much!  ----- Message -----  From: Nurse [name]  Sent: 09/19/2017 10:52 AM EST  To: Lars Mage Rivero  Subject: Colpo appointment  Zanaria,     I am glad you are feeling better. It would still be our recommendation that you have an appointment. Dr Oscar La does not have an opening after 230 on Wednesday at this time. Perhaps you can send me a couple of options to work with and I can see where we can schedule you at.     Kennon Rounds    [employer]

## 2017-09-22 MED FILL — ALPRAZolam 0.25 MG TABS: 0.25 | 30 days supply | Qty: 30 | Fill #1

## 2017-09-22 NOTE — Telephone Encounter (Signed)
Per review of Epic, telephone encounter dated 09/16/17, patient scheduled for colpo with Dr. Oscar La on 09/28/17, will close encounter.

## 2017-09-26 ENCOUNTER — Ambulatory Visit (INDEPENDENT_AMBULATORY_CARE_PROVIDER_SITE_OTHER): Payer: No Typology Code available for payment source

## 2017-09-26 DIAGNOSIS — Z23 Encounter for immunization: Secondary | ICD-10-CM

## 2017-09-28 ENCOUNTER — Other Ambulatory Visit: Payer: Self-pay

## 2017-09-28 ENCOUNTER — Encounter: Payer: Self-pay | Admitting: Obstetrics and Gynecology

## 2017-09-28 ENCOUNTER — Ambulatory Visit (INDEPENDENT_AMBULATORY_CARE_PROVIDER_SITE_OTHER): Payer: No Typology Code available for payment source | Admitting: Obstetrics and Gynecology

## 2017-09-28 VITALS — BP 122/80 | HR 80 | Resp 14 | Wt 181.0 lb

## 2017-09-28 DIAGNOSIS — Z01812 Encounter for preprocedural laboratory examination: Secondary | ICD-10-CM | POA: Diagnosis not present

## 2017-09-28 DIAGNOSIS — B977 Papillomavirus as the cause of diseases classified elsewhere: Secondary | ICD-10-CM | POA: Diagnosis not present

## 2017-09-28 LAB — POCT URINE PREGNANCY: Preg Test, Ur: NEGATIVE

## 2017-09-28 NOTE — Progress Notes (Signed)
GYNECOLOGY  VISIT   HPI: 42 y.o.   Divorced  Caucasian  female   G1P1001 with Patient's last menstrual period was 09/07/2017.   here for a colposcopy. Recent pap was negative with +HPV, also with negative pap with +HPV on her prior pap in 2016.  The patient has been scheduled for a hysteroscopy, polypectomy, D&C and IUD removal for abnormal bleeding and an endometrial polyp.   GYNECOLOGIC HISTORY: Patient's last menstrual period was 09/07/2017. Contraception:IUD (MIrena)  Menopausal hormone therapy: none         OB History    Gravida Para Term Preterm AB Living   1 1 1     1    SAB TAB Ectopic Multiple Live Births           1         Patient Active Problem List   Diagnosis Date Noted  . Pelvic pain 07/12/2017  . Neck pain 06/13/2017  . Carpal tunnel syndrome 11/16/2016  . MRI of brain abnormal 08/23/2016  . Tinnitus, bilateral 07/27/2016  . Low serum vitamin D 11/11/2015  . Generalized anxiety disorder 12/18/2014    Past Medical History:  Diagnosis Date  . Anxiety   . Chronic tension headaches   . Dysmenorrhea   . Neck pain, chronic     Past Surgical History:  Procedure Laterality Date  . DE QUERVAIN'S RELEASE Bilateral   . TONSILLECTOMY    . WRIST SURGERY Left    2 ganglion cyst removed    Current Outpatient Medications  Medication Sig Dispense Refill  . ALPRAZolam (XANAX) 0.5 MG tablet TAKE 1 TABLET BY MOUTH AT BEDTIME AS NEEDED FOR ANXIETY 30 tablet 1  . cyclobenzaprine (FLEXERIL) 10 MG tablet cyclobenzaprine 10 mg tablet  TAKE 1 TABLET BY MOUTH 3 TIMES A DAY AS NEEDED FOR MUSCLE SPASMS    . esomeprazole (NEXIUM) 40 MG capsule Take 1 capsule (40 mg total) by mouth daily. 90 capsule 1  . ibuprofen (ADVIL,MOTRIN) 200 MG tablet Take 200 mg by mouth every 6 (six) hours as needed for pain.    Marland Kitchen ketorolac (TORADOL) 10 MG tablet Take 1 tablet (10 mg total) by mouth every 6 (six) hours as needed. 20 tablet 0  . levonorgestrel (MIRENA) 20 MCG/24HR IUD by Intrauterine  route.    . Multiple Vitamin (MULTIVITAMIN WITH MINERALS) TABS Take 1 tablet by mouth daily.    . Polyethylene Glycol 3350 (MIRALAX PO) Take by mouth.    . propranolol (INDERAL) 20 MG tablet Take 1 tablet (20 mg total) by mouth daily as needed. 90 tablet 3  . topiramate (TOPAMAX) 50 MG tablet Take 1 tablet (50 mg total) by mouth 2 (two) times daily. 180 tablet 3   No current facility-administered medications for this visit.      ALLERGIES: Codeine and Other  History reviewed. No pertinent family history.  Social History   Socioeconomic History  . Marital status: Divorced    Spouse name: Not on file  . Number of children: Not on file  . Years of education: Not on file  . Highest education level: Not on file  Social Needs  . Financial resource strain: Not on file  . Food insecurity - worry: Not on file  . Food insecurity - inability: Not on file  . Transportation needs - medical: Not on file  . Transportation needs - non-medical: Not on file  Occupational History  . Not on file  Tobacco Use  . Smoking status: Former Smoker  Packs/day: 18.00    Years: 1.00    Pack years: 18.00  . Smokeless tobacco: Never Used  Substance and Sexual Activity  . Alcohol use: Yes    Alcohol/week: 0.6 - 1.2 oz    Types: 1 - 2 Standard drinks or equivalent per week  . Drug use: No  . Sexual activity: Yes    Partners: Male    Birth control/protection: IUD  Other Topics Concern  . Not on file  Social History Narrative  . Not on file    Review of Systems  Constitutional: Negative.   HENT: Negative.   Eyes: Negative.   Respiratory: Negative.   Cardiovascular: Negative.   Gastrointestinal: Negative.   Genitourinary: Negative.   Musculoskeletal: Negative.   Skin: Negative.   Neurological: Negative.   Endo/Heme/Allergies: Negative.   Psychiatric/Behavioral: Negative.     PHYSICAL EXAMINATION:    BP 122/80 (BP Location: Right Arm, Patient Position: Sitting, Cuff Size: Normal)    Pulse 80   Resp 14   Wt 181 lb (82.1 kg)   LMP 09/07/2017   BMI 31.07 kg/m     General appearance: alert, cooperative and appears stated age  Pelvic: External genitalia:  no lesions              Urethra:  normal appearing urethra with no masses, tenderness or lesions              Bartholins and Skenes: normal                 Vagina: normal appearing vagina with normal color and discharge, no lesions              Cervix: no gross lesions, IUD string 3 cm  Colposcopy: unsatisfactory anteriorly, mild acetowhite changes at 5 and 10 o'clock, biopsies taken. ECC Done. No lugols changes in the vagina.   Chaperone was present for exam.  ASSESSMENT Normal pap with +HPV x 2 Patient needs a hysteroscopy, polypectomy, D&C    PLAN Colposcopy with biopsies, ECC Will schedule her surgery   An After Visit Summary was printed and given to the patient.

## 2017-10-05 ENCOUNTER — Telehealth: Payer: Self-pay | Admitting: *Deleted

## 2017-10-05 NOTE — Telephone Encounter (Signed)
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 The rest of her biopsies were negative. She needs a f/u pap and hpv in one year.  CC: Billie Ruddy (If her surgery isn't set up, please do that, thanks)

## 2017-10-05 NOTE — Telephone Encounter (Signed)
Call to patient. Left message to call back.  

## 2017-10-10 ENCOUNTER — Encounter (HOSPITAL_BASED_OUTPATIENT_CLINIC_OR_DEPARTMENT_OTHER): Payer: Self-pay

## 2017-10-10 ENCOUNTER — Other Ambulatory Visit: Payer: Self-pay | Admitting: Family Medicine

## 2017-10-10 MED FILL — ALPRAZolam 0.5 MG TABS: 0.5 | 30 days supply | Qty: 30 | Fill #0

## 2017-10-10 NOTE — Telephone Encounter (Signed)
Call to patient. Left message to call back. Patient has difficulty reaching [place] by phone (see previous phone messages). Left message calling to review results, can speak to any triage nurse.     Patient is scheduled Wednesday for pre-op appointment. Surgery is scheduled for 10-24-17.

## 2017-10-10 NOTE — Telephone Encounter (Signed)
Ok to refill 

## 2017-10-12 ENCOUNTER — Encounter (HOSPITAL_BASED_OUTPATIENT_CLINIC_OR_DEPARTMENT_OTHER): Payer: Self-pay

## 2017-10-12 ENCOUNTER — Other Ambulatory Visit: Payer: Self-pay

## 2017-10-12 ENCOUNTER — Encounter: Payer: Self-pay | Admitting: Obstetrics and Gynecology

## 2017-10-12 ENCOUNTER — Ambulatory Visit (INDEPENDENT_AMBULATORY_CARE_PROVIDER_SITE_OTHER): Payer: No Typology Code available for payment source | Admitting: Obstetrics and Gynecology

## 2017-10-12 VITALS — BP 110/62 | HR 66 | Wt 179.0 lb

## 2017-10-12 DIAGNOSIS — Z975 Presence of (intrauterine) contraceptive device: Secondary | ICD-10-CM

## 2017-10-12 DIAGNOSIS — N939 Abnormal uterine and vaginal bleeding, unspecified: Secondary | ICD-10-CM

## 2017-10-12 DIAGNOSIS — N946 Dysmenorrhea, unspecified: Secondary | ICD-10-CM

## 2017-10-12 DIAGNOSIS — N840 Polyp of corpus uteri: Secondary | ICD-10-CM

## 2017-10-12 MED ORDER — NORETHIN ACE-ETH ESTRAD-FE 1-20 MG-MCG PO TABS: 1.0000 | ORAL_TABLET | Freq: Every day | ORAL | 0 refills | Status: DC

## 2017-10-12 NOTE — Progress Notes (Signed)
Spoke with:  Hospital doctor NPO:  After Midnight, no gum, candy, or mints   Arrival time:  0950AM Labs: Hemoglobin, Urine Pregnancy AM medications: Nexium, Topiramate, Propanolol if needed or Xanax if needed Pre op orders: No Ride home:  Will Luz Brazen (boyfriend) [phone]

## 2017-10-12 NOTE — Progress Notes (Signed)
GYNECOLOGY  VISIT   HPI: 41 y.o.   Divorced  Caucasian  female   G1P1001 with Patient's last menstrual period was 10/10/2017 (exact date).   here for pre-operative visit. The patient presented with abnormal uterine bleeding, intermittent pelvic pain and worsening cramps with the mirena IUD. Evaluation revealed a large endometrial polyp and bilateral functional ovarian cysts, IUD was in place.   GYNECOLOGIC HISTORY: Patient's last menstrual period was 10/10/2017 (exact date). Contraception:IUD Menopausal hormone therapy: NA        OB History    Gravida Para Term Preterm AB Living   1 1 1     1   SAB TAB Ectopic Multiple Live Births           1         Patient Active Problem List   Diagnosis Date Noted  . Pelvic pain 07/12/2017  . Neck pain 06/13/2017  . Carpal tunnel syndrome 11/16/2016  . MRI of brain abnormal 08/23/2016  . Tinnitus, bilateral 07/27/2016  . Low serum vitamin D 11/11/2015  . Generalized anxiety disorder 12/18/2014    Past Medical History:  Diagnosis Date  . Anxiety   . Carpal tunnel syndrome   . Chronic lower back pain   . Chronic tension headaches   . Constipation   . Dysmenorrhea   . Endometrial polyp   . Fatigue   . GERD (gastroesophageal reflux disease)   . History of lumbar puncture 08/2016  . HPV in female   . Low serum vitamin D   . Neck pain, chronic   . Pelvic pain   . Thyroid nodule   . Tinnitus, bilateral   . TMJ (temporomandibular joint disorder)   . White matter abnormality on MRI of brain     Past Surgical History:  Procedure Laterality Date  . DE QUERVAIN'S RELEASE Bilateral   . TONSILLECTOMY    . WISDOM TOOTH EXTRACTION    . WRIST SURGERY Bilateral    2 ganglion cyst removed    Current Outpatient Medications  Medication Sig Dispense Refill  . ALPRAZolam (XANAX) 0.5 MG tablet TAKE 1 TABLET BY MOUTH AT BEDTIME AS NEEDED FOR ANXIETY 30 tablet 1  . esomeprazole (NEXIUM) 40 MG capsule Take 1 capsule (40 mg total) by mouth  daily. 90 capsule 1  . ibuprofen (ADVIL,MOTRIN) 200 MG tablet Take 200 mg by mouth every 6 (six) hours as needed for pain.    . levonorgestrel (MIRENA) 20 MCG/24HR IUD by Intrauterine route.    . Multiple Vitamin (MULTIVITAMIN WITH MINERALS) TABS Take 1 tablet by mouth daily.    . Polyethylene Glycol 3350 (MIRALAX PO) Take by mouth as needed.     . propranolol (INDERAL) 20 MG tablet Take 1 tablet (20 mg total) by mouth daily as needed. (Patient taking differently: Take 20 mg by mouth daily as needed. ) 90 tablet 3  . topiramate (TOPAMAX) 50 MG tablet Take 1 tablet (50 mg total) by mouth 2 (two) times daily. 180 tablet 3   No current facility-administered medications for this visit.    Topamax is for headaches, not migraines.   ALLERGIES: Codeine and Other. Stomach upset  No family history on file.  Social History   Socioeconomic History  . Marital status: Divorced    Spouse name: Not on file  . Number of children: Not on file  . Years of education: Not on file  . Highest education level: Not on file  Social Needs  . Financial resource strain: Not on   file  . Food insecurity - worry: Not on file  . Food insecurity - inability: Not on file  . Transportation needs - medical: Not on file  . Transportation needs - non-medical: Not on file  Occupational History  . Not on file  Tobacco Use  . Smoking status: Former Smoker    Packs/day: 18.00    Years: 1.00    Pack years: 18.00  . Smokeless tobacco: Never Used  Substance and Sexual Activity  . Alcohol use: Yes    Alcohol/week: 0.6 - 1.2 oz    Types: 1 - 2 Standard drinks or equivalent per week  . Drug use: No  . Sexual activity: Yes    Partners: Male    Birth control/protection: IUD  Other Topics Concern  . Not on file  Social History Narrative  . Not on file    Review of Systems  Constitutional: Negative.   HENT: Negative.   Eyes: Negative.   Respiratory: Negative.   Cardiovascular: Negative.   Gastrointestinal:  Negative.   Genitourinary: Negative.   Musculoskeletal: Negative.   Skin: Negative.   Neurological: Negative.   Endo/Heme/Allergies: Negative.   Psychiatric/Behavioral: Negative.     PHYSICAL EXAMINATION:    BP 110/62 (BP Location: Right Arm, Patient Position: Sitting, Cuff Size: Normal)   Pulse 66   Wt 179 lb (81.2 kg)   LMP 10/10/2017 (Exact Date)   BMI 30.73 kg/m     General appearance: alert, cooperative and appears stated age Neck: no adenopathy, supple, symmetrical, trachea midline and thyroid normal to inspection and palpation Heart: regular rate and rhythm Lungs: CTAB Abdomen: soft, non-tender; bowel sounds normal; no masses,  no organomegaly Extremities: normal, atraumatic, no cyanosis Skin: normal color, texture and turgor, no rashes or lesions Lymph: normal cervical supraclavicular and inguinal nodes Neurologic: grossly normal   ASSESSMENT Abnormal uterine bleeding, endometrial polyp on sonohysterogram Dysmenorrhea    PLAN Plan: IUD removal hysteroscopy, polypectomy, dilation and curettage. Reviewed risks, including: bleeding, infection, uterine perforation, fluid overload, need for further sugery Will plan to start OCP's post op She should use condoms as well (on topamax which can decrease the effectiveness of OCP's) Will send a script for OCP's, f/u in 3 months (she knows she needs to use condoms because of the topamax) Will have her f/u at her post op and then a 3 month pill check. If doing well may switch her to the 3 month pill    An After Visit Summary was printed and given to the patient.   

## 2017-10-12 NOTE — H&P (Signed)
GYNECOLOGY  VISIT   HPI: 42 y.o.   Divorced  Caucasian  female   [id number] with Patient's last menstrual period was 10/10/2017 (exact date).   here for pre-operative visit. The patient presented with abnormal uterine bleeding, intermittent pelvic pain and worsening cramps with the mirena IUD. Evaluation revealed a large endometrial polyp and bilateral functional ovarian cysts, IUD was in place.   GYNECOLOGIC HISTORY: Patient's last menstrual period was 10/10/2017 (exact date). Contraception:IUD Menopausal hormone therapy: NA        OB History    Gravida Para Term Preterm AB Living   1 1 1     1    SAB TAB Ectopic Multiple Live Births           1         Patient Active Problem List   Diagnosis Date Noted  . Pelvic pain 07/12/2017  . Neck pain 06/13/2017  . Carpal tunnel syndrome 11/16/2016  . MRI of brain abnormal 08/23/2016  . Tinnitus, bilateral 07/27/2016  . Low serum vitamin D 11/11/2015  . Generalized anxiety disorder 12/18/2014    Past Medical History:  Diagnosis Date  . Anxiety   . Carpal tunnel syndrome   . Chronic lower back pain   . Chronic tension headaches   . Constipation   . Dysmenorrhea   . Endometrial polyp   . Fatigue   . GERD (gastroesophageal reflux disease)   . History of lumbar puncture 08/2016  . HPV in female   . Low serum vitamin D   . Neck pain, chronic   . Pelvic pain   . Thyroid nodule   . Tinnitus, bilateral   . TMJ (temporomandibular joint disorder)   . White matter abnormality on MRI of brain     Past Surgical History:  Procedure Laterality Date  . DE QUERVAIN'S RELEASE Bilateral   . TONSILLECTOMY    . WISDOM TOOTH EXTRACTION    . WRIST SURGERY Bilateral    2 ganglion cyst removed    Current Outpatient Medications  Medication Sig Dispense Refill  . ALPRAZolam (XANAX) 0.5 MG tablet TAKE 1 TABLET BY MOUTH AT BEDTIME AS NEEDED FOR ANXIETY 30 tablet 1  . esomeprazole (NEXIUM) 40 MG capsule Take 1 capsule (40 mg total) by mouth  daily. 90 capsule 1  . ibuprofen (ADVIL,MOTRIN) 200 MG tablet Take 200 mg by mouth every 6 (six) hours as needed for pain.    Marland Kitchen levonorgestrel (MIRENA) 20 MCG/24HR IUD by Intrauterine route.    . Multiple Vitamin (MULTIVITAMIN WITH MINERALS) TABS Take 1 tablet by mouth daily.    . Polyethylene Glycol 3350 (MIRALAX PO) Take by mouth as needed.     . propranolol (INDERAL) 20 MG tablet Take 1 tablet (20 mg total) by mouth daily as needed. (Patient taking differently: Take 20 mg by mouth daily as needed. ) 90 tablet 3  . topiramate (TOPAMAX) 50 MG tablet Take 1 tablet (50 mg total) by mouth 2 (two) times daily. 180 tablet 3   No current facility-administered medications for this visit.    Topamax is for headaches, not migraines.   ALLERGIES: Codeine and Other. Stomach upset  No family history on file.  Social History   Socioeconomic History  . Marital status: Divorced    Spouse name: Not on file  . Number of children: Not on file  . Years of education: Not on file  . Highest education level: Not on file  Social Needs  . Financial resource strain: Not on  file  . Food insecurity - worry: Not on file  . Food insecurity - inability: Not on file  . Transportation needs - medical: Not on file  . Transportation needs - non-medical: Not on file  Occupational History  . Not on file  Tobacco Use  . Smoking status: Former Smoker    Packs/day: 18.00    Years: 1.00    Pack years: 18.00  . Smokeless tobacco: Never Used  Substance and Sexual Activity  . Alcohol use: Yes    Alcohol/week: 0.6 - 1.2 oz    Types: 1 - 2 Standard drinks or equivalent per week  . Drug use: No  . Sexual activity: Yes    Partners: Male    Birth control/protection: IUD  Other Topics Concern  . Not on file  Social History Narrative  . Not on file    Review of Systems  Constitutional: Negative.   HENT: Negative.   Eyes: Negative.   Respiratory: Negative.   Cardiovascular: Negative.   Gastrointestinal:  Negative.   Genitourinary: Negative.   Musculoskeletal: Negative.   Skin: Negative.   Neurological: Negative.   Endo/Heme/Allergies: Negative.   Psychiatric/Behavioral: Negative.     PHYSICAL EXAMINATION:    BP 110/62 (BP Location: Right Arm, Patient Position: Sitting, Cuff Size: Normal)   Pulse 66   Wt 179 lb (81.2 kg)   LMP 10/10/2017 (Exact Date)   BMI 30.73 kg/m     General appearance: alert, cooperative and appears stated age Neck: no adenopathy, supple, symmetrical, trachea midline and thyroid normal to inspection and palpation Heart: regular rate and rhythm Lungs: CTAB Abdomen: soft, non-tender; bowel sounds normal; no masses,  no organomegaly Extremities: normal, atraumatic, no cyanosis Skin: normal color, texture and turgor, no rashes or lesions Lymph: normal cervical supraclavicular and inguinal nodes Neurologic: grossly normal   ASSESSMENT Abnormal uterine bleeding, endometrial polyp on sonohysterogram Dysmenorrhea    PLAN Plan: IUD removal hysteroscopy, polypectomy, dilation and curettage. Reviewed risks, including: bleeding, infection, uterine perforation, fluid overload, need for further sugery Will plan to start OCP's post op She should use condoms as well (on topamax which can decrease the effectiveness of OCP's) Will send a script for OCP's, f/u in 3 months (she knows she needs to use condoms because of the topamax) Will have her f/u at her post op and then a 3 month pill check. If doing well may switch her to the 3 month pill    An After Visit Summary was printed and given to the patient.

## 2017-10-12 NOTE — Telephone Encounter (Signed)
See office visit note from 10-12-17. 08 recall entered for 08-12-18. Annual is scheduled for 08-02-18.  Routing to provider for final review. Will close encounter.

## 2017-10-17 ENCOUNTER — Encounter: Payer: Self-pay | Admitting: Obstetrics and Gynecology

## 2017-10-17 ENCOUNTER — Telehealth: Payer: Self-pay | Admitting: Obstetrics and Gynecology

## 2017-10-17 NOTE — Telephone Encounter (Signed)
Let her know that I can give her a small amount of narcotics post op in case she needs it.

## 2017-10-17 NOTE — Telephone Encounter (Signed)
Routing to Dr.Jertson for review. 

## 2017-10-17 NOTE — Telephone Encounter (Signed)
-----   Message from Mychart, Generic sent at 10/17/2017 8:25 AM EST -----    Good Morning, Dr. Oscar La,    I am looking forward to getting this upcoming surgery over with (I know that sounds strange). I am anxious, however, about not having anything besides ibuprofen or Tylenol on hand after for pain control. I'm not trying to sound like a baby, but I've been dealing with pain and discomfort from this mess for over 2 years, and I know it's almost over (thank goodness!!), but even this past weekend I was having some pretty significant cramping and pelvic pain (no bleeding). I know the surgery is going to fix the problem :), but is there any way I could have something on hand just in case? I think it would help to give me peace of mind more than anything.    I will, as always, defer to your judgment. If you really don't think I'll need it, I'll "suck it up" so to speak! I'm just ready to be done with all of this!    Thank you so much!  Hospital doctor

## 2017-10-18 MED FILL — PROPRANOLOL 20 MG TABLET: 20 | 90 days supply | Qty: 90 | Fill #2

## 2017-10-18 NOTE — Telephone Encounter (Signed)
Message left to return call to Triage Nurse at [phone].    

## 2017-10-20 ENCOUNTER — Telehealth: Payer: Self-pay | Admitting: Obstetrics and Gynecology

## 2017-10-20 ENCOUNTER — Encounter: Payer: Self-pay | Admitting: Obstetrics and Gynecology

## 2017-10-20 NOTE — Telephone Encounter (Signed)
Patient sent the following correspondence through MyChart. Routing to triage to assist patient with request.  ----- Message from Mychart, Generic sent at 10/20/2017 12:16 PM EST -----    Good Afternoon,    I received a call from nurse triage the other day in response to the MyChart message I sent, but I haven't been able to call back due to being so busy at work. It really is easiest to communicate on MyChart, if it's possible for you to do that. If not, I understand.     My symptoms haven't changed. I'm just anxiously waiting for Monday to arrive!    Thank you so much!    Hospital doctor

## 2017-10-20 NOTE — Telephone Encounter (Signed)
Surgery questions 10/20/2017 1:03 PM    To: Kaislyn M Karapetyan    From: Ginny Forth, RN    Created: 10/20/2017 1:03 PM     Eesha,  Dr.Jertson reviewed your MyChart message and wanted to let you know that she can give you a small amount of narcotics after surgery in case you need it.  Sincerely,  Nolen Mu, RN     Encounter closed.

## 2017-10-20 NOTE — Telephone Encounter (Signed)
Previous telephone note closed in error:  Patient sent the following correspondence through MyChart. Routing to triage to assist patient with request.  ----- Message from Mychart, Generic sent at 10/20/2017 12:16 PM EST -----    Good Afternoon,    I received a call from nurse triage the other day in response to the MyChart message I sent, but I haven't been able to call back due to being so busy at work. It really is easiest to communicate on MyChart, if it's possible for you to do that. If not, I understand.     My symptoms haven't changed. I'm just anxiously waiting for Monday to arrive!    Thank you so much!    Makiyah             12:25 PM  [place], Hospital doctor M contacted Me (In Basket)  October 18, 2017  Jetta Lout, RN      8:34 AM  Note    Message left to return call to Triage Nurse at [phone].             8:34 AM    Jetta Lout, RN contacted Guthridge, Ricki Miller  October 17, 2017        5:54 PM  Romualdo Bolk, MD routed this conversation to Sprague, Caroleen Hamman, RN  Romualdo Bolk, MD      5:54 PM  Note    Let her know that I can give her a small amount of narcotics post op in case she needs it.           9:20 AM  Sprague, Caroleen Hamman, RN routed this conversation to Romualdo Bolk, MD  Sprague, Caroleen Hamman, RN      9:20 AM  Note    Routing to Dr.Jertson for review.          8:28 AM  Delorse Limber L routed this conversation to [facility] Triage Janina Mayo, Redmond Pulling      8:28 AM  Note    ----- Message from Mychart, Generic sent at 10/17/2017 8:25 AM EST -----    Good Morning, Dr. Oscar La,    I am looking forward to getting this upcoming surgery over with (I know that sounds strange). I am anxious, however, about not having anything besides ibuprofen or Tylenol on hand after for pain control. I'm not trying to sound like a baby, but I've been dealing with pain and discomfort from this mess  for over 2 years, and I know it's almost over (thank goodness!!), but even this past weekend I was having some pretty significant cramping and pelvic pain (no bleeding). I know the surgery is going to fix the problem :), but is there any way I could have something on hand just in case? I think it would help to give me peace of mind more than anything.    I will, as always, defer to your judgment. If you really don't think I'll need it, I'll "suck it up" so to speak! I'm just ready to be done with all of this!    Thank you so much!  Hospital doctor

## 2017-10-20 NOTE — Telephone Encounter (Signed)
Surgery questions 10/20/2017 1:03 PM    To: Ramanda M Fallas    From: Ginny Forth, RN    Created: 10/20/2017 1:03 PM     Marlina,  Dr.Jertson reviewed your MyChart message and wanted to let you know that she can give you a small amount of narcotics after surgery in case you need it.  Sincerely,  Nolen Mu, RN     Routing to provider for final review. Patient agreeable to disposition. Will close encounter.

## 2017-10-24 ENCOUNTER — Ambulatory Visit (HOSPITAL_BASED_OUTPATIENT_CLINIC_OR_DEPARTMENT_OTHER): Payer: No Typology Code available for payment source | Admitting: Anesthesiology

## 2017-10-24 ENCOUNTER — Other Ambulatory Visit: Payer: Self-pay

## 2017-10-24 ENCOUNTER — Encounter (HOSPITAL_BASED_OUTPATIENT_CLINIC_OR_DEPARTMENT_OTHER)
Admission: RE | Disposition: A | Discharge status: Home / Self Care | Payer: Self-pay | Source: Ambulatory Visit | Attending: Obstetrics and Gynecology

## 2017-10-24 ENCOUNTER — Ambulatory Visit (HOSPITAL_BASED_OUTPATIENT_CLINIC_OR_DEPARTMENT_OTHER)
Admission: RE | Admit: 2017-10-24 | Discharge: 2017-10-24 | Disposition: A | Discharge status: Home / Self Care | Payer: No Typology Code available for payment source | Source: Ambulatory Visit | Attending: Obstetrics and Gynecology | Admitting: Obstetrics and Gynecology

## 2017-10-24 ENCOUNTER — Encounter (HOSPITAL_BASED_OUTPATIENT_CLINIC_OR_DEPARTMENT_OTHER): Payer: Self-pay | Admitting: Anesthesiology

## 2017-10-24 DIAGNOSIS — Z79899 Other long term (current) drug therapy: Secondary | ICD-10-CM | POA: Insufficient documentation

## 2017-10-24 DIAGNOSIS — N9971 Accidental puncture and laceration of a genitourinary system organ or structure during a genitourinary system procedure: Secondary | ICD-10-CM | POA: Insufficient documentation

## 2017-10-24 DIAGNOSIS — F411 Generalized anxiety disorder: Secondary | ICD-10-CM | POA: Insufficient documentation

## 2017-10-24 DIAGNOSIS — Z885 Allergy status to narcotic agent status: Secondary | ICD-10-CM | POA: Insufficient documentation

## 2017-10-24 DIAGNOSIS — R102 Pelvic and perineal pain: Secondary | ICD-10-CM | POA: Diagnosis not present

## 2017-10-24 DIAGNOSIS — I10 Essential (primary) hypertension: Secondary | ICD-10-CM | POA: Insufficient documentation

## 2017-10-24 DIAGNOSIS — N840 Polyp of corpus uteri: Secondary | ICD-10-CM | POA: Insufficient documentation

## 2017-10-24 DIAGNOSIS — Y838 Other surgical procedures as the cause of abnormal reaction of the patient, or of later complication, without mention of misadventure at the time of the procedure: Secondary | ICD-10-CM | POA: Insufficient documentation

## 2017-10-24 DIAGNOSIS — N946 Dysmenorrhea, unspecified: Secondary | ICD-10-CM

## 2017-10-24 DIAGNOSIS — Z30432 Encounter for removal of intrauterine contraceptive device: Secondary | ICD-10-CM

## 2017-10-24 DIAGNOSIS — N83291 Other ovarian cyst, right side: Secondary | ICD-10-CM | POA: Insufficient documentation

## 2017-10-24 DIAGNOSIS — Z87891 Personal history of nicotine dependence: Secondary | ICD-10-CM | POA: Diagnosis not present

## 2017-10-24 DIAGNOSIS — N83292 Other ovarian cyst, left side: Secondary | ICD-10-CM | POA: Insufficient documentation

## 2017-10-24 DIAGNOSIS — K219 Gastro-esophageal reflux disease without esophagitis: Secondary | ICD-10-CM | POA: Insufficient documentation

## 2017-10-24 DIAGNOSIS — N939 Abnormal uterine and vaginal bleeding, unspecified: Secondary | ICD-10-CM | POA: Diagnosis not present

## 2017-10-24 HISTORY — DX: Polyp of corpus uteri: N84.0

## 2017-10-24 HISTORY — DX: Constipation, unspecified: K59.00

## 2017-10-24 HISTORY — DX: Pelvic and perineal pain: R10.2

## 2017-10-24 HISTORY — DX: Low back pain: M54.5

## 2017-10-24 HISTORY — DX: Other fatigue: R53.83

## 2017-10-24 HISTORY — DX: White matter disease, unspecified: R90.82

## 2017-10-24 HISTORY — PX: DILATATION & CURETTAGE/HYSTEROSCOPY WITH MYOSURE: SHX6511

## 2017-10-24 HISTORY — DX: Nontoxic single thyroid nodule: E04.1

## 2017-10-24 HISTORY — DX: Papillomavirus as the cause of diseases classified elsewhere: B97.7

## 2017-10-24 HISTORY — DX: Carpal tunnel syndrome, unspecified upper limb: G56.00

## 2017-10-24 HISTORY — DX: Gastro-esophageal reflux disease without esophagitis: K21.9

## 2017-10-24 HISTORY — DX: Low back pain, unspecified: M54.50

## 2017-10-24 HISTORY — DX: Other chronic pain: G89.29

## 2017-10-24 HISTORY — DX: Unspecified temporomandibular joint disorder, unspecified side: M26.609

## 2017-10-24 HISTORY — DX: Other specified postprocedural states: Z98.890

## 2017-10-24 HISTORY — DX: Other specified abnormal findings of blood chemistry: R79.89

## 2017-10-24 HISTORY — PX: IUD REMOVAL: SHX5392

## 2017-10-24 HISTORY — DX: Tinnitus, bilateral: H93.13

## 2017-10-24 HISTORY — DX: Pelvic and perineal pain unspecified side: R10.20

## 2017-10-24 LAB — POCT PREGNANCY, URINE: PREG TEST UR: NEGATIVE

## 2017-10-24 LAB — POCT HEMOGLOBIN-HEMACUE: Hemoglobin: 13.7 g/dL (ref 12.0–15.0)

## 2017-10-24 SURGERY — DILATATION & CURETTAGE/HYSTEROSCOPY WITH MYOSURE
Anesthesia: General | Site: Vagina

## 2017-10-24 MED ORDER — KETOROLAC TROMETHAMINE 30 MG/ML IJ SOLN
INTRAMUSCULAR | Status: AC
  Filled 2017-10-24: qty 1

## 2017-10-24 MED ORDER — FENTANYL CITRATE (PF) 100 MCG/2ML IJ SOLN
INTRAMUSCULAR | Status: AC
  Filled 2017-10-24: qty 2

## 2017-10-24 MED ORDER — MIDAZOLAM HCL 5 MG/5ML IJ SOLN
INTRAMUSCULAR | Status: DC | PRN
  Administered 2017-10-24: 2 mg via INTRAVENOUS

## 2017-10-24 MED ORDER — LACTATED RINGERS IV SOLN
INTRAVENOUS | Status: DC
  Administered 2017-10-24: 10:00:00 via INTRAVENOUS
  Filled 2017-10-24: qty 1000

## 2017-10-24 MED ORDER — OXYCODONE-ACETAMINOPHEN 5-325 MG PO TABS: 1.0000 | ORAL_TABLET | ORAL | 0 refills | Status: DC | PRN

## 2017-10-24 MED ORDER — SCOPOLAMINE 1 MG/3DAYS TD PT72
MEDICATED_PATCH | TRANSDERMAL | Status: AC
  Filled 2017-10-24: qty 1

## 2017-10-24 MED ORDER — LACTATED RINGERS IV SOLN
INTRAVENOUS | Status: DC
  Administered 2017-10-24 (×2): via INTRAVENOUS
  Filled 2017-10-24: qty 1000

## 2017-10-24 MED ORDER — DEXAMETHASONE SODIUM PHOSPHATE 4 MG/ML IJ SOLN
INTRAMUSCULAR | Status: DC | PRN
  Administered 2017-10-24: 10 mg via INTRAVENOUS

## 2017-10-24 MED ORDER — LIDOCAINE 2% (20 MG/ML) 5 ML SYRINGE
INTRAMUSCULAR | Status: AC
  Filled 2017-10-24: qty 5

## 2017-10-24 MED ORDER — ONDANSETRON HCL 4 MG/2ML IJ SOLN
INTRAMUSCULAR | Status: DC | PRN
  Administered 2017-10-24: 4 mg via INTRAVENOUS

## 2017-10-24 MED ORDER — PROPOFOL 10 MG/ML IV BOLUS
INTRAVENOUS | Status: DC | PRN
  Administered 2017-10-24: 50 mg via INTRAVENOUS
  Administered 2017-10-24: 200 mg via INTRAVENOUS

## 2017-10-24 MED ORDER — IBUPROFEN 800 MG PO TABS: 800.0000 mg | ORAL_TABLET | Freq: Three times a day (TID) | ORAL | 0 refills | Status: DC | PRN

## 2017-10-24 MED ORDER — KETOROLAC TROMETHAMINE 30 MG/ML IJ SOLN
INTRAMUSCULAR | Status: DC | PRN
  Administered 2017-10-24: 30 mg via INTRAVENOUS

## 2017-10-24 MED ORDER — PROPOFOL 10 MG/ML IV BOLUS
INTRAVENOUS | Status: AC
  Filled 2017-10-24: qty 40

## 2017-10-24 MED ORDER — MIDAZOLAM HCL 2 MG/2ML IJ SOLN
0.5000 mg | Freq: Once | INTRAMUSCULAR | Status: DC | PRN
  Filled 2017-10-24: qty 2

## 2017-10-24 MED ORDER — DEXAMETHASONE SODIUM PHOSPHATE 10 MG/ML IJ SOLN
INTRAMUSCULAR | Status: AC
  Filled 2017-10-24: qty 1

## 2017-10-24 MED ORDER — WHITE PETROLATUM EX OINT
TOPICAL_OINTMENT | CUTANEOUS | Status: AC
  Filled 2017-10-24: qty 10

## 2017-10-24 MED ORDER — PROPRANOLOL HCL 20 MG PO TABS
20.0000 mg | ORAL_TABLET | Freq: Every day | ORAL | Status: DC | PRN
  Administered 2017-10-24: 20 mg via ORAL
  Filled 2017-10-24 (×3): qty 1

## 2017-10-24 MED ORDER — SCOPOLAMINE 1 MG/3DAYS TD PT72
1.0000 | MEDICATED_PATCH | Freq: Once | TRANSDERMAL | Status: DC
  Administered 2017-10-24: 1.5 mg via TRANSDERMAL
  Filled 2017-10-24: qty 1

## 2017-10-24 MED ORDER — PROMETHAZINE HCL 25 MG/ML IJ SOLN
6.2500 mg | INTRAMUSCULAR | Status: DC | PRN
  Filled 2017-10-24: qty 1

## 2017-10-24 MED ORDER — FENTANYL CITRATE (PF) 100 MCG/2ML IJ SOLN
INTRAMUSCULAR | Status: DC | PRN
  Administered 2017-10-24: 50 ug via INTRAVENOUS
  Administered 2017-10-24 (×2): 25 ug via INTRAVENOUS

## 2017-10-24 MED ORDER — MEPERIDINE HCL 25 MG/ML IJ SOLN
6.2500 mg | INTRAMUSCULAR | Status: DC | PRN
  Filled 2017-10-24: qty 1

## 2017-10-24 MED ORDER — PROPRANOLOL HCL 20 MG PO TABS
20.0000 mg | ORAL_TABLET | Freq: Once | ORAL | Status: DC
  Filled 2017-10-24 (×2): qty 1

## 2017-10-24 MED ORDER — MIDAZOLAM HCL 2 MG/2ML IJ SOLN
INTRAMUSCULAR | Status: AC
  Filled 2017-10-24: qty 2

## 2017-10-24 MED ORDER — SODIUM CHLORIDE 0.9 % IR SOLN
Status: DC | PRN
  Administered 2017-10-24: 3000 mL

## 2017-10-24 MED ORDER — ONDANSETRON HCL 4 MG/2ML IJ SOLN
INTRAMUSCULAR | Status: AC
  Filled 2017-10-24: qty 2

## 2017-10-24 MED ORDER — FENTANYL CITRATE (PF) 100 MCG/2ML IJ SOLN
25.0000 ug | INTRAMUSCULAR | Status: DC | PRN
  Administered 2017-10-24: 50 ug via INTRAVENOUS
  Filled 2017-10-24: qty 1

## 2017-10-24 MED ORDER — LIDOCAINE HCL (CARDIAC) 20 MG/ML IV SOLN
INTRAVENOUS | Status: DC | PRN
  Administered 2017-10-24: 30 mg via INTRAVENOUS

## 2017-10-24 MED FILL — OXYCOD/ACETAMINOPHEN 5-325M: 5-325 | 1 days supply | Qty: 10 | Fill #0

## 2017-10-24 MED FILL — LARIN FE 1-20 TABLET: 1-20 | 84 days supply | Qty: 84 | Fill #0

## 2017-10-24 MED FILL — IBUPROFEN 800 MG TAB: 800 | 10 days supply | Qty: 30 | Fill #0

## 2017-10-24 SURGICAL SUPPLY — 16 items
CANISTER SUCT 3000ML PPV (MISCELLANEOUS) ×4 IMPLANT
COUNTER NEEDLE 1200 MAGNETIC (NEEDLE) ×2 IMPLANT
DEVICE MYOSURE LITE (MISCELLANEOUS) ×2 IMPLANT
GLOVE BIO SURGEON STRL SZ 6.5 (GLOVE) ×4 IMPLANT
GOWN STRL REUS W/TWL LRG LVL3 (GOWN DISPOSABLE) ×4 IMPLANT
IV NS IRRIG 3000ML ARTHROMATIC (IV SOLUTION) ×2 IMPLANT
PACK VAGINAL MINOR WOMEN LF (CUSTOM PROCEDURE TRAY) ×2 IMPLANT
PAD OB MATERNITY 4.3X12.25 (PERSONAL CARE ITEMS) ×2 IMPLANT
PAD PREP 24X48 CUFFED NSTRL (MISCELLANEOUS) ×2 IMPLANT
SEAL ROD LENS SCOPE MYOSURE (ABLATOR) ×2 IMPLANT
SUT VIC AB 2-0 SH 27 (SUTURE) ×1
SUT VIC AB 2-0 SH 27XBRD (SUTURE) ×1 IMPLANT
SYR 20CC LL (SYRINGE) IMPLANT
TOWEL OR 17X24 6PK STRL BLUE (TOWEL DISPOSABLE) ×4 IMPLANT
TUBING AQUILEX INFLOW (TUBING) ×2 IMPLANT
TUBING AQUILEX OUTFLOW (TUBING) ×2 IMPLANT

## 2017-10-24 NOTE — Anesthesia Preprocedure Evaluation (Addendum)
Anesthesia Evaluation  Patient identified by MRN, date of birth, ID band Patient awake    Reviewed: Allergy & Precautions, NPO status , Patient's Chart, lab work & pertinent test results, reviewed documented beta blocker date and time   History of Anesthesia Complications Negative for: history of anesthetic complications  Airway Mallampati: I  TM Distance: >3 FB Neck ROM: Full    Dental  (+) Dental Advisory Given, Teeth Intact   Pulmonary former smoker,    Pulmonary exam normal        Cardiovascular hypertension, Pt. on medications and Pt. on home beta blockers (-) angina Rhythm:Regular Rate:Normal     Neuro/Psych  Headaches, Anxiety    GI/Hepatic Neg liver ROS, GERD  Medicated and Controlled,  Endo/Other  negative endocrine ROS  Renal/GU negative Renal ROS     Musculoskeletal   Abdominal   Peds  Hematology negative hematology ROS (+)   Anesthesia Other Findings   Reproductive/Obstetrics                           Anesthesia Physical Anesthesia Plan  ASA: II  Anesthesia Plan: General   Post-op Pain Management:    Induction: Intravenous  PONV Risk Score and Plan: 3 and Ondansetron, Dexamethasone and Scopolamine patch - Pre-op  Airway Management Planned: LMA  Additional Equipment:   Intra-op Plan:   Post-operative Plan:   Informed Consent: I have reviewed the patients History and Physical, chart, labs and discussed the procedure including the risks, benefits and alternatives for the proposed anesthesia with the patient or authorized representative who has indicated his/her understanding and acceptance.   Dental advisory given  Plan Discussed with: CRNA and Surgeon  Anesthesia Plan Comments: (Plan routine monitors, GA- LMA OK)        Anesthesia Quick Evaluation

## 2017-10-24 NOTE — Op Note (Addendum)
Preoperative Diagnosis: abnormal uterine bleeding, intermittent pelvic pain, dysmenorrhea, endometrial polyp  Postoperative Diagnosis: same  Procedure: Removal of mirena IUD, Hysteroscopy, polypectomy, dilation and curettage  Surgeon: Dr Gertie Exon  Assistants: None  Anesthesia: General via LMA  EBL: 25 cc  Fluids: 800 cc  Fluid deficit: 156  Urine output: not recorded  Indications for surgery: The patient is a 42 yo female, who presented with abnormal uterine bleeding, pelvic pain and worsening dysmenorrhea with the mirena IUD. Work up included a sonohysterogram that revealed a large endometrial polyp, IUD in place, bilateral functional ovarian cysts.  The risks of the surgery were reviewed with the patient and the consent form was signed prior to her surgery.  Findings: EUA: retroverted uterus. Normal uterine cavity with bilateral tubal ostia and an anterior LUS endometrial polyp  Specimens: Endometrial polyp, endometrial curettings   Procedure: The patient was taken to the operating room with an IV in place. She was placed in the dorsal lithotomy position and anesthesia was administered. She was prepped and draped in the usual sterile fashion for a vaginal procedure. She voided on the way to the OR. A weighted speculum was placed in the vagina and a single tooth tenaculum was placed on the anterior lip of the cervix. The cervix was dilated to a #7 hagar dilator. The uterus was sounded to 8 cm. The myosure hysteroscope was inserted into the uterine cavity. With continuous infusion of normal saline, the uterine cavity was visualized with the above findings. The myosure light was used to resect the endometrial polyp. The myosure was then removed. The cavity was then curetted with the small sharp curette. The cavity had the characteristically gritty texture at the end of the procedure. The curette and the single tooth tenaculum were removed. There was a small laceration from the  tenaculum that was closed with a running locked stitch of 2-0 Vicryl. The speculum was removed. The patients perineum was cleansed of betadine and she was taken out of the dorsal lithotomy position.  Upon awakening the LMA was removed and the patient was transferred to the recovery room in stable and awake condition.  The sponge and instrument count were correct. There were no complications.    CC: Dr Earlene Plater  Addendum: after the speculum was placed in the vagina, the IUD was removed with ringed forceps.

## 2017-10-24 NOTE — Anesthesia Postprocedure Evaluation (Signed)
Anesthesia Post Note  Patient: Engineer, mining  Procedure(s) Performed: DILATATION & CURETTAGE/HYSTEROSCOPY WITH MYOSURE (N/A Vagina ) INTRAUTERINE DEVICE (IUD) REMOVAL (N/A Vagina )     Patient location during evaluation: PACU Anesthesia Type: General Level of consciousness: awake and alert, patient cooperative and oriented Pain management: pain level controlled Vital Signs Assessment: post-procedure vital signs reviewed and stable Respiratory status: spontaneous breathing, nonlabored ventilation and respiratory function stable Cardiovascular status: blood pressure returned to baseline and stable Postop Assessment: no apparent nausea or vomiting Anesthetic complications: no    Last Vitals:  Vitals:   10/24/17 1300 10/24/17 1308  BP: 103/74   Pulse: (!) 56 63  Resp: 14 14  Temp:    SpO2: 95% 98%    Last Pain:  Vitals:   10/24/17 1245  TempSrc:   PainSc: 2                  Gianno Volner,E. Iven Earnhart

## 2017-10-24 NOTE — Anesthesia Procedure Notes (Signed)
Procedure Name: LMA Insertion Date/Time: 10/24/2017 11:37 AM Performed by: Tyrone Nine, CRNA Pre-anesthesia Checklist: Patient identified, Emergency Drugs available, Suction available, Patient being monitored and Timeout performed Patient Re-evaluated:Patient Re-evaluated prior to induction Oxygen Delivery Method: Circle system utilized Preoxygenation: Pre-oxygenation with 100% oxygen Induction Type: IV induction Ventilation: Mask ventilation without difficulty LMA: LMA inserted LMA Size: 4.0 Number of attempts: 1 Placement Confirmation: positive ETCO2,  CO2 detector and breath sounds checked- equal and bilateral Tube secured with: Tape Dental Injury: Teeth and Oropharynx as per pre-operative assessment

## 2017-10-24 NOTE — Interval H&P Note (Signed)
History and Physical Interval Note:  10/24/2017 11:24 AM  [name]  has presented today for surgery, with the diagnosis of endometrial polyp  The various methods of treatment have been discussed with the patient and family. After consideration of risks, benefits and other options for treatment, the patient has consented to  Procedure(s): DILATATION & CURETTAGE/HYSTEROSCOPY WITH MYOSURE (N/A) INTRAUTERINE DEVICE (IUD) REMOVAL (N/A) as a surgical intervention .  The patient's history has been reviewed, patient examined, no change in status, stable for surgery.  I have reviewed the patient's chart and labs.  Questions were answered to the patient's satisfaction.     Romualdo Bolk

## 2017-10-24 NOTE — Discharge Instructions (Signed)

## 2017-10-24 NOTE — Transfer of Care (Signed)
Immediate Anesthesia Transfer of Care Note  Patient: [name]  Procedure(s) Performed: DILATATION & CURETTAGE/HYSTEROSCOPY WITH MYOSURE (N/A Vagina ) INTRAUTERINE DEVICE (IUD) REMOVAL (N/A Vagina )  Patient Location: PACU  Anesthesia Type:General  Level of Consciousness: awake, alert , oriented and patient cooperative  Airway & Oxygen Therapy: Patient Spontanous Breathing and Patient connected to nasal cannula oxygen  Post-op Assessment: Report given to RN and Post -op Vital signs reviewed and stable  Post vital signs: Reviewed and stable  Last Vitals:  Vitals:   10/24/17 1124 10/24/17 1125  BP: 124/76 124/76  Pulse:  69  Resp:    Temp:    SpO2:      Last Pain:  Vitals:   10/24/17 0949  TempSrc: Oral         Complications: No apparent anesthesia complications

## 2017-10-25 ENCOUNTER — Encounter (HOSPITAL_BASED_OUTPATIENT_CLINIC_OR_DEPARTMENT_OTHER): Payer: Self-pay | Admitting: Obstetrics and Gynecology

## 2017-10-25 ENCOUNTER — Ambulatory Visit: Payer: 59 | Admitting: Neurology

## 2017-10-25 ENCOUNTER — Encounter: Payer: Self-pay | Admitting: Neurology

## 2017-10-26 ENCOUNTER — Other Ambulatory Visit: Payer: Self-pay | Admitting: Family Medicine

## 2017-10-26 MED ORDER — OXYCODONE-ACETAMINOPHEN 5-325 MG PO TABS: 1.0000 | ORAL_TABLET | ORAL | 0 refills | Status: DC | PRN

## 2017-10-26 MED FILL — OXYCOD/ACETAMINOPHEN 5-325M: 5-325 | 5 days supply | Qty: 30 | Fill #0

## 2017-11-01 ENCOUNTER — Other Ambulatory Visit: Payer: Self-pay | Admitting: Family Medicine

## 2017-11-01 MED ORDER — CYCLOBENZAPRINE HCL 10 MG PO TABS: 10.0000 mg | ORAL_TABLET | Freq: Three times a day (TID) | ORAL | 0 refills | Status: DC | PRN

## 2017-11-01 MED FILL — CYCLOBENZAPRINE 10 MG TAB: 10 | 6 days supply | Qty: 20 | Fill #0

## 2017-11-01 NOTE — Progress Notes (Signed)
Patient with low back pain, SI stiffness since surgery.  Ordered Flexeril for spasms and recommended exercises (hanfout provided).   [name]

## 2017-11-04 ENCOUNTER — Other Ambulatory Visit: Payer: Self-pay | Admitting: Family Medicine

## 2017-11-04 NOTE — Telephone Encounter (Signed)
Please get more info for documentation. She had post-op pain and I gave small amount of opiod, then muscle relaxer. If still having pain, ask her to see Dr. Berline Chough - my instructions this week.

## 2017-11-04 NOTE — Telephone Encounter (Signed)
MEDICATION:   PHARMACY:  [facility]   IS THIS A 90 DAY SUPPLY : yes   IS PATIENT OUT OF MEDICATION:   IF NOT; HOW MUCH IS LEFT:   LAST APPOINTMENT DATE: @1 /28/2019  NEXT APPOINTMENT DATE:@5 /14/2019  OTHER COMMENTS:    **Let patient know to contact pharmacy at the end of the day to make sure medication is ready. **  ** Please notify patient to allow 48-72 hours to process**  **Encourage patient to contact the pharmacy for refills or they can request refills through [facility]**

## 2017-11-04 NOTE — Telephone Encounter (Signed)
Spoke to patient she did not request refill it was sent by pharmacy.

## 2017-11-07 MED FILL — ALPRAZolam 0.5 MG TABS: 0.5 | 30 days supply | Qty: 30 | Fill #1

## 2017-11-09 ENCOUNTER — Ambulatory Visit (INDEPENDENT_AMBULATORY_CARE_PROVIDER_SITE_OTHER): Payer: No Typology Code available for payment source | Admitting: Obstetrics and Gynecology

## 2017-11-09 ENCOUNTER — Other Ambulatory Visit: Payer: Self-pay

## 2017-11-09 ENCOUNTER — Encounter: Payer: Self-pay | Admitting: Obstetrics and Gynecology

## 2017-11-09 VITALS — BP 112/78 | HR 80 | Resp 16 | Wt 178.0 lb

## 2017-11-09 DIAGNOSIS — Z9889 Other specified postprocedural states: Secondary | ICD-10-CM | POA: Diagnosis not present

## 2017-11-09 NOTE — Progress Notes (Signed)
GYNECOLOGY  VISIT   HPI: 42 y.o.   Divorced  Caucasian  female   G1P1001 with Patient's last menstrual period was 10/10/2017 (exact date).   here for follow up. Patient is 16 days s/p D&C hysteroscopy, polypectomy, removal of IUD. She was having AUB, pelvic pain and dysmenorrhea prior to surgery. Pathology showed a benign polyp and atrophic endometrium.  She was started on OCP's for contraception and cycle control (will also use condoms because she is on topamax).  No bleeding, no current pain. She had some lower back pain for a few days post op (thinks it was from the OR table). She is feeling better already since her surgery, the pain and spotting have resolved. She hasn't been sexually active since her surgery.   GYNECOLOGIC HISTORY: Patient's last menstrual period was 10/10/2017 (exact date). Contraception:OCP Menopausal hormone therapy: none         OB History    Gravida Para Term Preterm AB Living   1 1 1     1    SAB TAB Ectopic Multiple Live Births           1         Patient Active Problem List   Diagnosis Date Noted  . Pelvic pain 07/12/2017  . Neck pain 06/13/2017  . Carpal tunnel syndrome 11/16/2016  . MRI of brain abnormal 08/23/2016  . Tinnitus, bilateral 07/27/2016  . Low serum vitamin D 11/11/2015  . Generalized anxiety disorder 12/18/2014    Past Medical History:  Diagnosis Date  . Anxiety   . Carpal tunnel syndrome   . Chronic lower back pain   . Chronic tension headaches   . Constipation   . Dysmenorrhea   . Endometrial polyp   . Fatigue   . GERD (gastroesophageal reflux disease)   . History of lumbar puncture 08/2016  . HPV in female   . Low serum vitamin D   . Neck pain, chronic   . Pelvic pain   . Thyroid nodule   . Tinnitus, bilateral   . TMJ (temporomandibular joint disorder)   . White matter abnormality on MRI of brain     Past Surgical History:  Procedure Laterality Date  . DE QUERVAIN'S RELEASE Bilateral   . DILATATION &  CURETTAGE/HYSTEROSCOPY WITH MYOSURE N/A 10/24/2017   Procedure: DILATATION & CURETTAGE/HYSTEROSCOPY WITH MYOSURE;  Surgeon: Romualdo Bolk, MD;  Location: [facility] [place];  Service: Gynecology;  Laterality: N/A;  . IUD REMOVAL N/A 10/24/2017   Procedure: INTRAUTERINE DEVICE (IUD) REMOVAL;  Surgeon: Romualdo Bolk, MD;  Location: [facility];  Service: Gynecology;  Laterality: N/A;  . TONSILLECTOMY    . WISDOM TOOTH EXTRACTION    . WRIST SURGERY Bilateral    2 ganglion cyst removed    Current Outpatient Medications  Medication Sig Dispense Refill  . ALPRAZolam (XANAX) 0.5 MG tablet TAKE 1 TABLET BY MOUTH AT BEDTIME AS NEEDED FOR ANXIETY 30 tablet 1  . cyclobenzaprine (FLEXERIL) 10 MG tablet Take 1 tablet (10 mg total) by mouth 3 (three) times daily as needed for muscle spasms. 20 tablet 0  . esomeprazole (NEXIUM) 40 MG capsule Take 1 capsule (40 mg total) by mouth daily. 90 capsule 1  . ibuprofen (ADVIL,MOTRIN) 800 MG tablet Take 1 tablet (800 mg total) by mouth every 8 (eight) hours as needed. 30 tablet 0  . Multiple Vitamin (MULTIVITAMIN WITH MINERALS) TABS Take 1 tablet by mouth daily.    . norethindrone-ethinyl estradiol (JUNEL FE,GILDESS FE,LOESTRIN  FE) 1-20 MG-MCG tablet Take 1 tablet by mouth daily. 3 Package 0  . Polyethylene Glycol 3350 (MIRALAX PO) Take by mouth as needed.     . propranolol (INDERAL) 20 MG tablet Take 1 tablet (20 mg total) by mouth daily as needed. (Patient taking differently: Take 20 mg by mouth daily as needed. ) 90 tablet 3  . topiramate (TOPAMAX) 50 MG tablet Take 1 tablet (50 mg total) by mouth 2 (two) times daily. 180 tablet 3   No current facility-administered medications for this visit.      ALLERGIES: Codeine and Other  History reviewed. No pertinent family history.  Social History   Socioeconomic History  . Marital status: Divorced    Spouse name: Not on file  . Number of children: Not on file  . Years of  education: Not on file  . Highest education level: Not on file  Social Needs  . Financial resource strain: Not on file  . Food insecurity - worry: Not on file  . Food insecurity - inability: Not on file  . Transportation needs - medical: Not on file  . Transportation needs - non-medical: Not on file  Occupational History  . Not on file  Tobacco Use  . Smoking status: Former Smoker    Packs/day: 18.00    Years: 1.00    Pack years: 18.00    Last attempt to quit: 12/29/2012    Years since quitting: 4.8  . Smokeless tobacco: Never Used  Substance and Sexual Activity  . Alcohol use: Yes    Alcohol/week: 0.6 - 1.2 oz    Types: 1 - 2 Standard drinks or equivalent per week  . Drug use: No  . Sexual activity: Yes    Partners: Male    Birth control/protection: IUD  Other Topics Concern  . Not on file  Social History Narrative  . Not on file    Review of Systems  Constitutional: Negative.   HENT: Negative.   Eyes: Negative.   Respiratory: Negative.   Cardiovascular: Negative.   Gastrointestinal: Negative.   Genitourinary: Negative.   Musculoskeletal: Positive for back pain.  Skin: Negative.   Neurological: Negative.   Endo/Heme/Allergies: Negative.   Psychiatric/Behavioral: Negative.     PHYSICAL EXAMINATION:    BP 112/78 (BP Location: Right Arm, Patient Position: Sitting, Cuff Size: Normal)   Pulse 80   Resp 16   Wt 178 lb (80.7 kg)   LMP 10/10/2017 (Exact Date)   BMI 30.55 kg/m     General appearance: alert, cooperative and appears stated age Abdomen: soft, non-tender; non distended, no masses,  no organomegaly   ASSESSMENT 2 weeks s/p hysteroscopy, polypectomy, D&C. Spotting and pain from prior to surgery have resolved. She started on OCP's 2 weeks ago, feeling good    PLAN Calendar any bleeding F/U in 2 months, if doing well may switch her to the 3 month pill Use condoms (on topamax)   An After Visit Summary was printed and given to the patient.  CC: Dr  Earlene Plater

## 2017-11-29 ENCOUNTER — Ambulatory Visit: Payer: Self-pay

## 2017-11-29 ENCOUNTER — Ambulatory Visit: Payer: No Typology Code available for payment source | Admitting: Sports Medicine

## 2017-11-29 ENCOUNTER — Encounter: Payer: Self-pay | Admitting: Sports Medicine

## 2017-11-29 VITALS — BP 118/72 | HR 72 | Ht 64.0 in | Wt 170.0 lb

## 2017-11-29 DIAGNOSIS — M79672 Pain in left foot: Secondary | ICD-10-CM | POA: Diagnosis not present

## 2017-11-29 NOTE — Patient Instructions (Signed)

## 2017-11-29 NOTE — Progress Notes (Signed)
  [name] - 42 y.o. female MRN [id number]  Date of birth: [birth year]  Scribe for today's visit: Stevenson Clinch, CMA     SUBJECTIVE:  [name] is here for left heel pain  Her L heel pain symptoms INITIALLY: Began 3 weeks ago and there is no known injury or trauma.  Described as moderate pain, nonradiating Worsened with weightbearing or palpation.  Improved with rest.  Additional associated symptoms include: no swelling, redness.     At this time symptoms are worsening compared to onset, began as a dull pain in the morning, now pain is constant.  She has tried IBU, ice, stretching, and massage with minimal relief.    ROS Denies night time disturbances. Denies fevers, chills, or night sweats. Denies unexplained weight loss. Denies personal history of cancer. Denies changes in bowel or bladder habits. Denies recent unreported falls. Denies new or worsening dyspnea or wheezing. Denies headaches or dizziness.  Denies numbness, tingling or weakness  In the extremities.  Denies dizziness or presyncopal episodes Denies lower extremity edema    HISTORY & PERTINENT PRIOR DATA:  Prior History reviewed and updated per electronic medical record.  Significant/pertinent history, findings, studies include:  reports that she quit smoking about 5 years ago. She has a 18.00 pack-year smoking history. She has never used smokeless tobacco. No results for input(s): HGBA1C, LABURIC, CREATINE in the last 8760 hours. No specialty comments available. No problems updated.  OBJECTIVE:  VS:  HT:5\' 4"  (162.6 cm)   WT:170 lb (77.1 kg)  BMI:29.17    BP:118/72  HR:72bpm  TEMP: ( )  RESP:98 %   PHYSICAL EXAM: Constitutional: WDWN, Non-toxic appearing. Psychiatric: Alert & appropriately interactive.  Not depressed or anxious appearing. Respiratory: No increased work of breathing.  Trachea Midline Eyes: Pupils are equal.  EOM intact without nystagmus.  No scleral icterus  Vascular  Exam: warm to touch no edema  lower extremity neuro exam: unremarkable normal strength normal sensation normal reflexes  MSK Exam: Persistent pain with calcaneal squeeze and was focally along the posterior aspect of the insertion of the Achilles.  No pain with palpation of the avascular zone of the Achilles.  Normal dorsal flexion plantarflexion strength and range of motion.  Including dorsiflexion to 115 degrees.   ASSESSMENT & PLAN:   1. Pain of left heel     PLAN: Retrocalcaneal injection performed today after discussing risks benefits including increased risk of potential tendon rupture.  If any lack of improvement she will let us know and follow-up otherwise she will call as needed.  Follow-up: Return if symptoms worsen or fail to improve.      Please see additional documentation for Objective, Assessment and Plan sections. Pertinent additional documentation may be included in corresponding procedure notes, imaging studies, problem based documentation and patient instructions. Please see these sections of the encounter for additional information regarding this visit.  CMA/ATC served as Neurosurgeon during this visit. History, Physical, and Plan performed by medical provider. Documentation and orders reviewed and attested to.      [name], DO    [facility]

## 2017-11-29 NOTE — Procedures (Signed)
PROCEDURE NOTE:  Ultrasound Guided: Injection: left retro calcaneal bursse Images were obtained and interpreted by myself, Gaspar Bidding, DO  Images have been saved and stored to PACS system. Images obtained on: GE S7 Ultrasound machine   DESCRIPTION OF PROCEDURE:  The patient's clinical condition is marked by substantial pain and/or significant functional disability. Other conservative therapy has not provided relief, is contraindicated, or not appropriate. There is a reasonable likelihood that injection will significantly improve the patient's pain and/or functional impairment.  After discussing the risks, benefits and expected outcomes of the injection and all questions were reviewed and answered, the patient wished to undergo the above named procedure. Verbal consent was obtained.  The ultrasound was used to identify the target structure and adjacent neurovascular structures. The skin was then prepped in sterile fashion and the target structure was injected under direct visualization using sterile technique as below:  PREP: Alcohol, Ethel Chloride APPROACH: direct, single injection, 22g 1.5in.  INJECTATE: 1cc: 0.5% marcaine, 1cc: 40mg /mL DepoMedrol   ASPIRATE: None   DRESSING: Band-Aid &:    Post procedural instructions including recommending icing and warning signs for infection were reviewed.   This procedure was well tolerated and there were no complications.   IMPRESSION: Succesful Ultrasound Guided: Injection

## 2017-12-06 ENCOUNTER — Other Ambulatory Visit: Payer: Self-pay | Admitting: Family Medicine

## 2017-12-06 MED ORDER — PREDNISONE 5 MG PO TABS: ORAL_TABLET | ORAL | 0 refills | Status: DC

## 2017-12-06 MED ORDER — AMOXICILLIN-POT CLAVULANATE 875-125 MG PO TABS: 1.0000 | ORAL_TABLET | Freq: Two times a day (BID) | ORAL | 0 refills | Status: DC

## 2017-12-06 MED FILL — TOPIRAMATE 50 MG TABLET: 50 | 90 days supply | Qty: 180 | Fill #2

## 2017-12-06 MED FILL — predniSONE 5 MG (21) TBPK: 5 | 6 days supply | Qty: 21 | Fill #0

## 2017-12-06 MED FILL — AMOX TR-K CLV 875-125 MG TA: 875-125 | 10 days supply | Qty: 20 | Fill #0

## 2017-12-06 NOTE — Telephone Encounter (Signed)
Please advise on refill.

## 2017-12-16 ENCOUNTER — Other Ambulatory Visit: Payer: Self-pay | Admitting: Surgical

## 2017-12-16 DIAGNOSIS — J329 Chronic sinusitis, unspecified: Secondary | ICD-10-CM

## 2017-12-16 MED ORDER — LEVOFLOXACIN 500 MG PO TABS: 500.0000 mg | ORAL_TABLET | Freq: Every day | ORAL | 0 refills | Status: DC

## 2017-12-16 MED ORDER — PREDNISONE 10 MG PO TABS: ORAL_TABLET | ORAL | 0 refills | Status: DC

## 2017-12-16 MED ORDER — MONTELUKAST SODIUM 10 MG PO TABS: 10.0000 mg | ORAL_TABLET | Freq: Every day | ORAL | 3 refills | Status: DC

## 2017-12-16 MED FILL — ESOMEPRAZOLE MAG DR 40 MG C: 40 | 90 days supply | Qty: 90 | Fill #1

## 2017-12-16 MED FILL — levoFLOXacin 500 MG TABS: 500 | 10 days supply | Qty: 10 | Fill #0

## 2017-12-16 MED FILL — predniSONE 10 MG (21) TBPK: 10 | 6 days supply | Qty: 21 | Fill #0

## 2017-12-16 MED FILL — MONTELUKAST SOD 10 MG TAB: 10 | 30 days supply | Qty: 30 | Fill #0

## 2017-12-22 ENCOUNTER — Other Ambulatory Visit: Payer: Self-pay | Admitting: Obstetrics and Gynecology

## 2017-12-22 NOTE — Telephone Encounter (Signed)
Medication refill request: Junel 1/20, #3packs, 0R Last AEX:  07-27-17 Next AEX: 08-02-18 Last MMG (if hormonal medication request): 04-16-16 stable probably benign Rt.Br.calcifications, Lt.Br.asymmetry/Neg/BiRads3--See Epic Refill authorized: Please advise

## 2017-12-23 ENCOUNTER — Other Ambulatory Visit: Payer: Self-pay | Admitting: Family Medicine

## 2017-12-23 MED ORDER — ALPRAZOLAM 0.5 MG PO TABS: 0.5000 mg | ORAL_TABLET | Freq: Every evening | ORAL | 2 refills | Status: DC | PRN

## 2017-12-23 MED FILL — ALPRAZolam 0.5 MG TABS: 0.5 | 30 days supply | Qty: 30 | Fill #0

## 2017-12-26 MED ORDER — NORETHIN ACE-ETH ESTRAD-FE 1-20 MG-MCG PO TABS: 1.0000 | ORAL_TABLET | Freq: Every day | ORAL | 0 refills | Status: DC

## 2017-12-26 NOTE — Telephone Encounter (Signed)
She is due for a pill check, one pack was sent in. Please set her up for an appointment.

## 2017-12-26 NOTE — Telephone Encounter (Signed)
Called patient and left message for her to return my call. 

## 2018-01-09 NOTE — Telephone Encounter (Signed)
Called patient and left message for her to return my call. 

## 2018-01-11 MED FILL — PROPRANOLOL 20 MG TABLET: 20 | 90 days supply | Qty: 90 | Fill #3

## 2018-01-11 MED FILL — NORETHIN-ESTRAD-FERR 1-0.02: 1-20 | 28 days supply | Qty: 28 | Fill #0

## 2018-01-11 MED FILL — MONTELUKAST SOD 10 MG TAB: 10 | 30 days supply | Qty: 30 | Fill #1

## 2018-01-19 ENCOUNTER — Encounter: Payer: Self-pay | Admitting: Sports Medicine

## 2018-01-20 MED FILL — ALPRAZolam 0.5 MG TABS: 0.5 | 30 days supply | Qty: 30 | Fill #1

## 2018-01-23 ENCOUNTER — Other Ambulatory Visit (INDEPENDENT_AMBULATORY_CARE_PROVIDER_SITE_OTHER): Payer: No Typology Code available for payment source | Admitting: Surgical

## 2018-01-23 DIAGNOSIS — R21 Rash and other nonspecific skin eruption: Secondary | ICD-10-CM | POA: Diagnosis not present

## 2018-01-23 MED ORDER — METHYLPREDNISOLONE ACETATE 80 MG/ML IJ SUSP
80.0000 mg | Freq: Once | INTRAMUSCULAR | Status: AC
Start: 2018-01-23 — End: 2018-01-23
  Administered 2018-01-23: 80 mg via INTRAMUSCULAR

## 2018-01-23 NOTE — Progress Notes (Signed)
Depo Mederol 80 mg given in Right Deltoid. Patient tolerated well.

## 2018-01-24 ENCOUNTER — Encounter: Payer: Self-pay | Admitting: Family Medicine

## 2018-01-24 ENCOUNTER — Ambulatory Visit (INDEPENDENT_AMBULATORY_CARE_PROVIDER_SITE_OTHER): Payer: No Typology Code available for payment source | Admitting: Surgical

## 2018-01-24 DIAGNOSIS — Z23 Encounter for immunization: Secondary | ICD-10-CM

## 2018-01-24 NOTE — Progress Notes (Signed)
Patient comes in today for her final HPV injection. Injection given in left deltoid. Patient tolerated well.

## 2018-01-26 ENCOUNTER — Other Ambulatory Visit: Payer: Self-pay | Admitting: Family Medicine

## 2018-01-26 MED ORDER — PREDNISONE 50 MG PO TABS: ORAL_TABLET | ORAL | 0 refills | Status: DC

## 2018-01-26 MED FILL — predniSONE 50 MG TABS: 50 | 5 days supply | Qty: 5 | Fill #0

## 2018-01-26 NOTE — Progress Notes (Addendum)
Start prednisone burst for hives.  Katina Degree. Jimmey Ralph, MD 01/26/2018 2:49 PM

## 2018-01-30 ENCOUNTER — Other Ambulatory Visit: Payer: Self-pay | Admitting: Obstetrics and Gynecology

## 2018-01-30 NOTE — Telephone Encounter (Signed)
Patient sent the following message through MyChart. Routing to triage to assist patient with request.  [place], [place] [facility]  Phone Number: [phone]        ----- Message from Mychart, Generic sent at 01/30/2018 1:39 PM EDT -----   Ricki Miller [place] would like a refill of the following medications:    norethindrone-ethinyl estradiol (JUNEL FE,GILDESS FE,LOESTRIN FE) 1-20 MG-MCG tablet Romualdo Bolk, MD]   Patient Comment: I know I need to come in for 2-3 month follow up. Very short staffed at work and I cannot get time off. Everything is going wonderfully, I just need to be able to stay on my birth control until I can come in for an appointment. Thank you!   Preferred pharmacy: [facility] - [place], [place] - [address]

## 2018-01-30 NOTE — Telephone Encounter (Signed)
Left message to call [name] at [phone].   Needs 3 mo med f/u. I pkg OCP sent on 12/22/17. Started OCP post op in 10/2017.

## 2018-02-07 ENCOUNTER — Other Ambulatory Visit: Payer: Self-pay | Admitting: Obstetrics and Gynecology

## 2018-02-07 MED FILL — NORETHIN-ESTRAD-FERR 1-0.02: 1-20 | 28 days supply | Qty: 28 | Fill #0

## 2018-02-07 NOTE — Telephone Encounter (Signed)
One pack sent.

## 2018-02-07 NOTE — Telephone Encounter (Signed)
Called patient and left message for her to return call.  She needs 3 month follow up.  Medication refill request: OCP Last AEX:  07-27-17 Next AEX: 08-02-18 Last MMG (if hormonal medication request): 04-16-16 See Epic Refill authorized: please advise  Patient has been called several times and reminded she needs 3 month follow up.

## 2018-02-08 ENCOUNTER — Other Ambulatory Visit: Payer: Self-pay | Admitting: Family Medicine

## 2018-02-08 MED ORDER — NORETHIN ACE-ETH ESTRAD-FE 1-20 MG-MCG PO TABS: 1.0000 | ORAL_TABLET | Freq: Every day | ORAL | 11 refills | Status: DC

## 2018-02-08 MED ORDER — FLUOXETINE HCL 20 MG PO TABS: 20.0000 mg | ORAL_TABLET | Freq: Every day | ORAL | 3 refills | Status: DC

## 2018-02-08 MED FILL — FLUoxetine HCL 20 MG TABS: 20 | 90 days supply | Qty: 90 | Fill #0

## 2018-02-08 NOTE — Telephone Encounter (Signed)
See refill encounter dated 02/07/18. Will close encounter.

## 2018-02-19 MED FILL — ALPRAZolam 0.5 MG TABS: 0.5 | 30 days supply | Qty: 30 | Fill #2

## 2018-02-20 MED FILL — MONTELUKAST SOD 10 MG TAB: 10 | 30 days supply | Qty: 30 | Fill #2

## 2018-03-06 MED FILL — TOPIRAMATE 50 MG TABLET: 50 | 90 days supply | Qty: 180 | Fill #3

## 2018-03-06 MED FILL — NORETHIN-ESTRAD-FERR 1-0.02: 1-20 | 84 days supply | Qty: 84 | Fill #0

## 2018-03-17 ENCOUNTER — Other Ambulatory Visit: Payer: Self-pay | Admitting: Family Medicine

## 2018-03-17 MED ORDER — HYDROCOD POLST-CPM POLST ER 10-8 MG/5ML PO SUER: 5.0000 mL | Freq: Every evening | ORAL | 0 refills | Status: DC | PRN

## 2018-03-17 MED FILL — HYDROCODONE-CHLORPHEN ER SU: 10-8 | 24 days supply | Qty: 120 | Fill #0

## 2018-03-22 ENCOUNTER — Other Ambulatory Visit: Payer: Self-pay | Admitting: Family Medicine

## 2018-03-22 DIAGNOSIS — K219 Gastro-esophageal reflux disease without esophagitis: Secondary | ICD-10-CM

## 2018-03-22 MED ORDER — ALPRAZOLAM 0.5 MG PO TABS: 0.5000 mg | ORAL_TABLET | Freq: Every evening | ORAL | 2 refills | Status: DC | PRN

## 2018-03-22 MED ORDER — ESOMEPRAZOLE MAGNESIUM 40 MG PO CPDR: 40.0000 mg | DELAYED_RELEASE_CAPSULE | Freq: Every day | ORAL | 1 refills | Status: DC

## 2018-03-22 MED FILL — ALPRAZolam 0.5 MG TABS: 0.5 | 30 days supply | Qty: 30 | Fill #0

## 2018-03-22 MED FILL — MONTELUKAST SOD 10 MG TAB: 10 | 30 days supply | Qty: 30 | Fill #3

## 2018-03-22 MED FILL — ESOMEPRAZOLE MAG DR 40 MG C: 40 | 90 days supply | Qty: 90 | Fill #0

## 2018-03-29 ENCOUNTER — Ambulatory Visit: Payer: Self-pay

## 2018-03-29 ENCOUNTER — Ambulatory Visit: Payer: No Typology Code available for payment source | Admitting: Sports Medicine

## 2018-03-29 ENCOUNTER — Encounter: Payer: Self-pay | Admitting: Sports Medicine

## 2018-03-29 ENCOUNTER — Ambulatory Visit (INDEPENDENT_AMBULATORY_CARE_PROVIDER_SITE_OTHER): Payer: No Typology Code available for payment source

## 2018-03-29 VITALS — BP 98/68 | HR 68 | Ht 64.0 in | Wt 170.8 lb

## 2018-03-29 DIAGNOSIS — M25552 Pain in left hip: Secondary | ICD-10-CM

## 2018-03-29 NOTE — Patient Instructions (Addendum)

## 2018-03-29 NOTE — Progress Notes (Signed)
[name]. [name] [phone]  [name] - 42 y.o. female MRN [id number]  Date of birth: May 28, 1976  Visit Date: 03/29/2018  PCP: Helane Rima, DO   Referred by: Helane Rima, DO  Scribe(s) for today's visit: Christoper Fabian, LAT, ATC  SUBJECTIVE:  [name] is here for Initial Assessment (L hip pain) .    Her L hip pain symptoms INITIALLY: Began about 2 months ago w/ no known MOI. Described as moderate aching/throbbing pain at rest and sharp pain when she crosses her L leg over midline.  Pain occasionally radiates into her L groin/inner thigh.  Reports that the pain began as intermittent in nature but has become more constant in nature presently.  She also notes a sense of weakness/fatigue in her L hip. Worsened with walking/weight bearing especially during the toe-off phase of gait; attempting to cross her L leg over her R; loaded L hip IR Improved with rest Additional associated symptoms include: no swelling, mechanical symptoms in the L hip and no N/T into the L LE    At this time symptoms are worsening compared to onset w/ her pain progressing from intermittent to constant She has been taking her leftover Duexis from her prior L heel/plantar fascia and states this does nothing for her pain.   REVIEW OF SYSTEMS: Reports night time disturbances.  Pt is a L side sleeper and can't sleep on her L side currently. Denies fevers, chills, or night sweats. Denies unexplained weight loss. Denies personal history of cancer. Denies changes in bowel or bladder habits. Denies recent unreported falls. Denies new or worsening dyspnea or wheezing. Denies headaches or dizziness.  Denies numbness, tingling or weakness  In the extremities.  Denies dizziness or presyncopal episodes Denies lower extremity edema    HISTORY & PERTINENT PRIOR DATA:  Prior History reviewed and updated per electronic medical record.    Significant/pertinent history, findings, studies include:  reports that she quit smoking about 5 years ago. She has a 18.00 pack-year smoking history. She has never used smokeless tobacco. No results for input(s): HGBA1C, LABURIC, CREATINE in the last 8760 hours. No specialty comments available. No problems updated.  OBJECTIVE:  VS:  HT:5\' 4"  (162.6 cm)   WT:170 lb 12.8 oz (77.5 kg)  BMI:29.3    BP:98/68  HR:68bpm  TEMP: ( )  RESP:98 %   PHYSICAL EXAM: Constitutional: WDWN, Non-toxic appearing. Psychiatric: Alert & appropriately interactive.  Not depressed or anxious appearing. Respiratory: No increased work of breathing.  Trachea Midline Eyes: Pupils are equal.  EOM intact without nystagmus.  No scleral icterus  Vascular Exam: warm to touch no edema  lower extremity neuro exam: unremarkable  MSK Exam: Left hip is well aligned.  She has pain with logroll as well as FADIR testing.  No significant mechanical symptoms with axial loading circumduction.  Normal Stinchfield test without pain.  Hip abduction strength is 5 out of 5.  No significant pain with hip adductor testing.  Strength is normal.  No significant pain over the tensor fascia lata or IT band.   ASSESSMENT & PLAN:   1. Left hip pain     PLAN: Diagnostic and therapeutic intra-articular injection performed today.  She will plan to follow-up if any lack of improvement.  If persistent ongoing symptoms or worsening mechanical symptoms with MR arthrogram of the left hip will be recommended.  Avoid exacerbating activities.   Follow-up: Return if symptoms worsen  or fail to improve.      Please see additional documentation for Objective, Assessment and Plan sections. Pertinent additional documentation may be included in corresponding procedure notes, imaging studies, problem based documentation and patient instructions. Please see these sections of the encounter for additional information regarding this visit.  CMA/ATC  served as Neurosurgeon during this visit. History, Physical, and Plan performed by medical provider. Documentation and orders reviewed and attested to.      Alejandra Mews, DO    [facility]ician

## 2018-03-29 NOTE — Procedures (Signed)
PROCEDURE NOTE:  Ultrasound Guided: Injection: Left hip Images were obtained and interpreted by myself, Gaspar Bidding, DO  Images have been saved and stored to PACS system. Images obtained on: GE S7 Ultrasound machine    ULTRASOUND FINDINGS:  Questionable small effusion.  Questionable early degenerative spur  DESCRIPTION OF PROCEDURE:  The patient's clinical condition is marked by substantial pain and/or significant functional disability. Other conservative therapy has not provided relief, is contraindicated, or not appropriate. There is a reasonable likelihood that injection will significantly improve the patient's pain and/or functional impairment.   After discussing the risks, benefits and expected outcomes of the injection and all questions were reviewed and answered, the patient wished to undergo the above named procedure.  Verbal consent was obtained.  The ultrasound was used to identify the target structure and adjacent neurovascular structures. The skin was then prepped in sterile fashion and the target structure was injected under direct visualization using sterile technique as below:  Single injection performed as below: PREP: Alcohol and Ethel Chloride APPROACH:direct, stopcock technique, 22g 3.5 in. INJECTATE: 5 cc 1% lidocaine, 2 cc 0.5% Marcaine and 2 cc 40mg /mL DepoMedrol ASPIRATE: None DRESSING: Band-Aid  Post procedural instructions including recommending icing and warning signs for infection were reviewed.    This procedure was well tolerated and there were no complications.   IMPRESSION: Succesful Ultrasound Guided: Injection

## 2018-04-12 ENCOUNTER — Other Ambulatory Visit: Payer: Self-pay | Admitting: Family Medicine

## 2018-04-12 MED FILL — PROPRANOLOL 20 MG TABLET: 20 | 90 days supply | Qty: 90 | Fill #0

## 2018-04-19 MED FILL — ALPRAZolam 0.5 MG TABS: 0.5 | 30 days supply | Qty: 30 | Fill #1

## 2018-04-26 ENCOUNTER — Other Ambulatory Visit: Payer: Self-pay | Admitting: Family Medicine

## 2018-04-26 DIAGNOSIS — M25552 Pain in left hip: Secondary | ICD-10-CM

## 2018-04-26 MED ORDER — OXYCODONE-ACETAMINOPHEN 5-325 MG PO TABS: 1.0000 | ORAL_TABLET | ORAL | 0 refills | Status: DC | PRN

## 2018-04-26 MED FILL — OXYCODONE-ACETAMINOPHEN 5-3: 5-325 | 5 days supply | Qty: 30 | Fill #0

## 2018-05-05 ENCOUNTER — Other Ambulatory Visit: Payer: Self-pay | Admitting: Surgical

## 2018-05-05 MED ORDER — FLUOXETINE HCL 20 MG PO CAPS: 20.0000 mg | ORAL_CAPSULE | Freq: Every day | ORAL | 3 refills | Status: DC

## 2018-05-05 MED FILL — FLUoxetine HCL 20 MG CAPS: 20 | 90 days supply | Qty: 90 | Fill #0

## 2018-05-17 MED FILL — ALPRAZolam 0.5 MG TABS: 0.5 | 30 days supply | Qty: 30 | Fill #2

## 2018-05-22 MED FILL — NORETHIN-ESTRAD-FERR 1-0.02: 1-20 | 84 days supply | Qty: 84 | Fill #1

## 2018-05-25 ENCOUNTER — Other Ambulatory Visit: Payer: Self-pay

## 2018-05-25 MED ORDER — OXYCODONE-ACETAMINOPHEN 5-325 MG PO TABS: 1.0000 | ORAL_TABLET | ORAL | 0 refills | Status: DC | PRN

## 2018-05-25 MED FILL — OXYCODONE-ACETAMINOPHEN 5-3: 5-325 | 5 days supply | Qty: 30 | Fill #0

## 2018-06-06 ENCOUNTER — Other Ambulatory Visit: Payer: Self-pay | Admitting: Surgical

## 2018-06-06 MED ORDER — TOPIRAMATE 50 MG PO TABS: 50.0000 mg | ORAL_TABLET | Freq: Two times a day (BID) | ORAL | 3 refills | Status: DC

## 2018-06-06 MED FILL — TOPIRAMATE 50 MG TABLET: 50 | 90 days supply | Qty: 180 | Fill #0

## 2018-06-13 ENCOUNTER — Other Ambulatory Visit: Payer: Self-pay | Admitting: Family Medicine

## 2018-06-13 MED FILL — ESOMEPRAZOLE MAG DR 40 MG C: 40 | 90 days supply | Qty: 90 | Fill #1

## 2018-06-13 NOTE — Telephone Encounter (Signed)
Ok to refill 

## 2018-06-14 ENCOUNTER — Encounter: Payer: Self-pay | Admitting: Sports Medicine

## 2018-06-14 ENCOUNTER — Ambulatory Visit: Payer: No Typology Code available for payment source | Admitting: Sports Medicine

## 2018-06-14 ENCOUNTER — Ambulatory Visit: Payer: Self-pay

## 2018-06-14 VITALS — BP 104/70 | HR 68 | Ht 64.0 in | Wt 169.8 lb

## 2018-06-14 DIAGNOSIS — M79672 Pain in left foot: Secondary | ICD-10-CM | POA: Diagnosis not present

## 2018-06-14 MED FILL — ALPRAZolam 0.5 MG TABS: 0.5 | 30 days supply | Qty: 30 | Fill #0

## 2018-06-14 NOTE — Progress Notes (Signed)

## 2018-06-14 NOTE — Patient Instructions (Addendum)

## 2018-06-14 NOTE — Procedures (Signed)
PROCEDURE NOTE:  Ultrasound Guided: Injection: Left Retrocalcaneal bursa Images were obtained and interpreted by myself, Gaspar Bidding, DO  Images have been saved and stored to PACS system. Images obtained on: GE S7 Ultrasound machine    ULTRASOUND FINDINGS:  Small amount of fluid within the calcaneal space.  Small osteophytic spur off the insertion of the Achilles tendon.  No significant swelling of the tendon.  DESCRIPTION OF PROCEDURE:  The patient's clinical condition is marked by substantial pain and/or significant functional disability. Other conservative therapy has not provided relief, is contraindicated, or not appropriate. There is a reasonable likelihood that injection will significantly improve the patient's pain and/or functional impairment.   After discussing the risks, benefits and expected outcomes of the injection and all questions were reviewed and answered, the patient wished to undergo the above named procedure.  Verbal consent was obtained.  The ultrasound was used to identify the target structure and adjacent neurovascular structures. The skin was then prepped in sterile fashion and the target structure was injected under direct visualization using sterile technique as below:  Single injection performed as below: PREP: Alcohol and Ethel Chloride APPROACH:direct, single injection, 22g 1.5 in. INJECTATE: 1 cc 0.5% Marcaine and 1 cc 40mg /mL DepoMedrol ASPIRATE: None DRESSING: Band-Aid  Post procedural instructions including recommending icing and warning signs for infection were reviewed.    This procedure was well tolerated and there were no complications.   IMPRESSION: Succesful Ultrasound Guided: Injection

## 2018-06-14 NOTE — Progress Notes (Signed)
[name] - 42 y.o. female MRN [id number]  Date of birth: 1976-08-10  Scribe for today's visit: Christoper Fabian, LAT, ATC     SUBJECTIVE:  [name] is here for Follow-up (L heel pain)  Her L heel pain symptoms INITIALLY: Began 3 weeks ago and there is no known injury or trauma.  Described as moderate pain, nonradiating Worsened with weightbearing or palpation.  Improved with rest.  Additional associated symptoms include: no swelling, redness.     At this time symptoms are worsening compared to onset, began as a dull pain in the morning, now pain is constant.  She has tried IBU, ice, stretching, and massage with minimal relief.   06/14/2018: Compared to the last office visit on 11/29/17, her previously described L post heel pain symptoms are worsening over the past few days.  She notes that she started limping yesterday. Current symptoms are moderate aching/sharp pain & are nonradiating She has been taking IBU prn.  She had a L heel steroid injection on 11/29/17 which helped her previous symptoms.  Pt notes that her L hip has improved slightly but con't to have pain w/ any type of rotational activities or w/ prolonged walking.  ROS Reports night time disturbances. Denies fevers, chills, or night sweats. Denies unexplained weight loss. Denies personal history of cancer. Denies changes in bowel or bladder habits. Denies recent unreported falls. Denies new or worsening dyspnea or wheezing. Denies headaches or dizziness.  Denies numbness, tingling or weakness  In the extremities.  Denies dizziness or presyncopal episodes Denies lower extremity edema    HISTORY & PERTINENT PRIOR DATA:  Prior History reviewed and updated per electronic medical record.  Significant/pertinent history, findings, studies include:  reports that she quit smoking about 5 years ago. She has a 18.00 pack-year smoking history. She has never used smokeless tobacco. No results for input(s): HGBA1C,  LABURIC, CREATINE in the last 8760 hours. No specialty comments available. No problems updated.  OBJECTIVE:  VS:  HT:5\' 4"  (162.6 cm)   WT:169 lb 12.8 oz (77 kg)  BMI:29.13    BP:104/70  HR:68bpm  TEMP: ( )  RESP:98 %   PHYSICAL EXAM: Constitutional: WDWN, Non-toxic appearing. Psychiatric: Alert & appropriately interactive.  Not depressed or anxious appearing. Respiratory: No increased work of breathing.  Trachea Midline Eyes: Pupils are equal.  EOM intact without nystagmus.  No scleral icterus  Vascular Exam: warm to touch no edema  lower extremity neuro exam: unremarkable normal strength normal sensation normal reflexes  MSK Exam: Persistent pain with calcaneal squeeze and was focally along the posterior aspect of the insertion of the Achilles.  No pain with palpation of the avascular zone of the Achilles.  Normal dorsal flexion plantarflexion strength and range of motion.  Including dorsiflexion to 115 degrees.   ASSESSMENT & PLAN:   1. Pain of left heel     PLAN: Retrocalcaneal injection performed today after discussing risks benefits including increased risk of potential tendon rupture.  If any lack of improvement she will let us know and follow-up otherwise she will call as needed.  Follow-up: Return if symptoms worsen or fail to improve.      Please see additional documentation for Objective, Assessment and Plan sections. Pertinent additional documentation may be included in corresponding procedure notes, imaging studies, problem based documentation and patient instructions. Please see these sections of the encounter for additional information regarding this visit.  CMA/ATC served as Neurosurgeon during this visit. History, Physical, and Plan  performed by medical provider. Documentation and orders reviewed and attested to.      Gerda Diss, [facility] Physician

## 2018-06-20 ENCOUNTER — Other Ambulatory Visit (INDEPENDENT_AMBULATORY_CARE_PROVIDER_SITE_OTHER): Payer: No Typology Code available for payment source

## 2018-06-20 ENCOUNTER — Other Ambulatory Visit: Payer: Self-pay | Admitting: Surgical

## 2018-06-20 DIAGNOSIS — L659 Nonscarring hair loss, unspecified: Secondary | ICD-10-CM

## 2018-06-20 DIAGNOSIS — R5383 Other fatigue: Secondary | ICD-10-CM

## 2018-06-20 DIAGNOSIS — Z13220 Encounter for screening for lipoid disorders: Secondary | ICD-10-CM

## 2018-06-20 LAB — CBC WITH DIFFERENTIAL/PLATELET
Basophils Absolute: 0.1 10*3/uL (ref 0.0–0.1)
Basophils Relative: 1 % (ref 0.0–3.0)
Eosinophils Absolute: 0.2 10*3/uL (ref 0.0–0.7)
Eosinophils Relative: 1.7 % (ref 0.0–5.0)
HCT: 40.1 % (ref 36.0–46.0)
Hemoglobin: 13.3 g/dL (ref 12.0–15.0)
Lymphocytes Relative: 24.2 % (ref 12.0–46.0)
Lymphs Abs: 2.5 10*3/uL (ref 0.7–4.0)
MCHC: 33.1 g/dL (ref 30.0–36.0)
MCV: 96.3 fl (ref 78.0–100.0)
Monocytes Absolute: 0.6 10*3/uL (ref 0.1–1.0)
Monocytes Relative: 6 % (ref 3.0–12.0)
Neutro Abs: 7 10*3/uL (ref 1.4–7.7)
Neutrophils Relative %: 67.1 % (ref 43.0–77.0)
Platelets: 214 10*3/uL (ref 150.0–400.0)
RBC: 4.16 Mil/uL (ref 3.87–5.11)
RDW: 12.8 % (ref 11.5–15.5)
WBC: 10.4 10*3/uL (ref 4.0–10.5)

## 2018-06-20 LAB — LIPID PANEL
Cholesterol: 207 mg/dL — ABNORMAL HIGH (ref 0–200)
HDL: 57.3 mg/dL (ref >39.00)
LDL Cholesterol: 115 mg/dL — ABNORMAL HIGH (ref 0–99)
NonHDL: 149.5
Total CHOL/HDL Ratio: 4
Triglycerides: 171 mg/dL — ABNORMAL HIGH (ref 0.0–149.0)
VLDL: 34.2 mg/dL (ref 0.0–40.0)

## 2018-06-20 LAB — COMPREHENSIVE METABOLIC PANEL
ALT: 7 U/L (ref 0–35)
AST: 7 U/L (ref 0–37)
Albumin: 4.3 g/dL (ref 3.5–5.2)
Alkaline Phosphatase: 69 U/L (ref 39–117)
BUN: 20 mg/dL (ref 6–23)
CO2: 23 mEq/L (ref 19–32)
Calcium: 9.1 mg/dL (ref 8.4–10.5)
Chloride: 108 mEq/L (ref 96–112)
Creatinine, Ser: 0.6 mg/dL (ref 0.40–1.20)
GFR: 115.71 mL/min (ref >60.00)
Glucose, Bld: 89 mg/dL (ref 70–99)
Potassium: 4.4 mEq/L (ref 3.5–5.1)
Sodium: 139 mEq/L (ref 135–145)
Total Bilirubin: 0.2 mg/dL (ref 0.2–1.2)
Total Protein: 6.9 g/dL (ref 6.0–8.3)

## 2018-06-20 LAB — VITAMIN D 25 HYDROXY (VIT D DEFICIENCY, FRACTURES): VITD: 27.2 ng/mL — ABNORMAL LOW (ref 30.00–100.00)

## 2018-06-20 LAB — TSH: TSH: 1.83 u[IU]/mL (ref 0.35–4.50)

## 2018-06-20 LAB — VITAMIN B12: Vitamin B-12: 366 pg/mL (ref 211–911)

## 2018-07-10 MED FILL — PROPRANOLOL 20 MG TABLET: 20 | 90 days supply | Qty: 90 | Fill #1

## 2018-07-12 MED FILL — ALPRAZolam 0.5 MG TABS: 0.5 | 30 days supply | Qty: 30 | Fill #1

## 2018-08-02 ENCOUNTER — Ambulatory Visit (INDEPENDENT_AMBULATORY_CARE_PROVIDER_SITE_OTHER): Payer: No Typology Code available for payment source | Admitting: Obstetrics and Gynecology

## 2018-08-02 ENCOUNTER — Other Ambulatory Visit (HOSPITAL_COMMUNITY)
Admission: RE | Admit: 2018-08-02 | Discharge: 2018-08-02 | Disposition: A | Discharge status: Home / Self Care | Payer: No Typology Code available for payment source | Source: Ambulatory Visit | Attending: Obstetrics and Gynecology | Admitting: Obstetrics and Gynecology

## 2018-08-02 ENCOUNTER — Other Ambulatory Visit: Payer: Self-pay

## 2018-08-02 ENCOUNTER — Encounter: Payer: Self-pay | Admitting: Obstetrics and Gynecology

## 2018-08-02 VITALS — BP 110/72 | HR 64 | Ht 63.78 in | Wt 168.2 lb

## 2018-08-02 DIAGNOSIS — Z3041 Encounter for surveillance of contraceptive pills: Secondary | ICD-10-CM

## 2018-08-02 DIAGNOSIS — R921 Mammographic calcification found on diagnostic imaging of breast: Secondary | ICD-10-CM

## 2018-08-02 DIAGNOSIS — Z01419 Encounter for gynecological examination (general) (routine) without abnormal findings: Secondary | ICD-10-CM

## 2018-08-02 DIAGNOSIS — N920 Excessive and frequent menstruation with regular cycle: Secondary | ICD-10-CM | POA: Diagnosis not present

## 2018-08-02 DIAGNOSIS — Z124 Encounter for screening for malignant neoplasm of cervix: Secondary | ICD-10-CM | POA: Diagnosis not present

## 2018-08-02 DIAGNOSIS — N946 Dysmenorrhea, unspecified: Secondary | ICD-10-CM

## 2018-08-02 DIAGNOSIS — N6489 Other specified disorders of breast: Secondary | ICD-10-CM

## 2018-08-02 NOTE — Progress Notes (Signed)
Patient scheduled for Bilateral Diagnostic Mammogram with tomography on 08/09/18 at 3:40. Agreeable to time/date/location.

## 2018-08-02 NOTE — Progress Notes (Signed)
42 y.o. [id number] Divorced White or Caucasian Not Hispanic or Latino female here for annual exam.   Period Cycle (Days): 28 Period Duration (Days): 4-5 days Period Pattern: Regular Menstrual Flow: Heavy Menstrual Control: Tampon, Thin pad Menstrual Control Change Freq (Hours): changes tampon/pad every 2-3 hours Dysmenorrhea: (!) Moderate Dysmenorrhea Symptoms: Cramping  H/O hysteroscopy, polypectomy, IUD removal in 2/19. She went on OCP's, cycles were great for a few months.  On her heaviest day she is saturating a super tampon in 1-2 hours, passes clots (1/2 dollar sized). Cramps are helped with ibuprofen (not as bad as prior to IUD removal earlier this year).  Not anemia, normal TSH last month. She finds it hard to function at work. Didn't do well with the mirena IUD.  Sexually active same partner x 2.5 years, not using condoms. She is on Topamax 50 mg BID  Patient's last menstrual period was 07/12/2018 (exact date).          Sexually active: Yes.    The current method of family planning is OCP (estrogen/progesterone).    Exercising: Yes.    disc golf Smoker:  no  Health Maintenance: Pap:  08/01/2017 WNL positive HR HPV, 06-27-15 + HPV History of abnormal Pap:  Yes, colpo 09/28/2017 atypia bordering CIN I MMG:  04-16-16 needed DX MMG 09/2016 Colonoscopy:  Never  BMD:   Never TDaP:  10/2016  Gardasil: x 3   reports that she quit smoking about 5 years ago. She has a 18.00 pack-year smoking history. She has never used smokeless tobacco. She reports that she drinks about 1.0 - 2.0 standard drinks of alcohol per week. She reports that she does not use drugs. 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 Son is 21, senior at [employer]  Past Medical History:  Diagnosis Date  . Anxiety   . Carpal tunnel syndrome   . Chronic lower back pain   . Chronic tension headaches   . Constipation   . Dysmenorrhea   . Endometrial polyp   . Fatigue   . GERD (gastroesophageal reflux disease)   . History of lumbar  puncture 08/2016  . HPV in female   . Low serum vitamin D   . Neck pain, chronic   . Pelvic pain   . Thyroid nodule   . Tinnitus, bilateral   . TMJ (temporomandibular joint disorder)   . White matter abnormality on MRI of brain     Past Surgical History:  Procedure Laterality Date  . DE QUERVAIN'S RELEASE Bilateral   . DILATATION & CURETTAGE/HYSTEROSCOPY WITH MYOSURE N/A 10/24/2017   Procedure: DILATATION & CURETTAGE/HYSTEROSCOPY WITH MYOSURE;  Surgeon: Romualdo Bolk, MD;  Location: [facility];  Service: Gynecology;  Laterality: N/A;  . IUD REMOVAL N/A 10/24/2017   Procedure: INTRAUTERINE DEVICE (IUD) REMOVAL;  Surgeon: Romualdo Bolk, MD;  Location: [facility];  Service: Gynecology;  Laterality: N/A;  . TONSILLECTOMY    . WISDOM TOOTH EXTRACTION    . WRIST SURGERY Bilateral    2 ganglion cyst removed    Current Outpatient Medications  Medication Sig Dispense Refill  . ALPRAZolam (XANAX) 0.5 MG tablet TAKE 1 TABLET BY MOUTH ONCE DAILY AT BEDTIME AS NEEDED FOR ANXIETY 30 tablet 2  . esomeprazole (NEXIUM) 40 MG capsule Take 1 capsule (40 mg total) by mouth daily. 90 capsule 1  . FLUoxetine (PROZAC) 20 MG capsule Take 1 capsule (20 mg total) by mouth daily. 90 capsule 3  . Multiple Vitamin (MULTIVITAMIN WITH  MINERALS) TABS Take 1 tablet by mouth daily.    . norethindrone-ethinyl estradiol (JUNEL FE,GILDESS FE,LOESTRIN FE) 1-20 MG-MCG tablet Take 1 tablet by mouth daily. 28 tablet 11  . Polyethylene Glycol 3350 (MIRALAX PO) Take by mouth as needed.     . propranolol (INDERAL) 20 MG tablet Take 1 tablet (20 mg total) by mouth daily as needed. 90 tablet 3  . topiramate (TOPAMAX) 50 MG tablet Take 1 tablet (50 mg total) by mouth 2 (two) times daily. 180 tablet 3  . montelukast (SINGULAIR) 10 MG tablet Take 1 tablet (10 mg total) by mouth at bedtime. 30 tablet 3   No current facility-administered medications for this visit.     History  reviewed. No pertinent family history.  Review of Systems  Constitutional: Negative.   HENT: Negative.   Eyes: Negative.   Respiratory: Negative.   Cardiovascular: Negative.   Gastrointestinal: Negative.   Endocrine: Negative.   Genitourinary: Positive for menstrual problem.       Painful menses  Musculoskeletal: Negative.   Skin: Negative.   Allergic/Immunologic: Negative.   Neurological: Negative.   Hematological: Negative.   Psychiatric/Behavioral: Negative.     Exam:   BP 110/72 (BP Location: Right Arm, Patient Position: Sitting, Cuff Size: Normal)   Pulse 64   Ht 5' 3.78" (1.62 m)   Wt 168 lb 3.2 oz (76.3 kg)   LMP 07/12/2018 (Exact Date)   BMI 29.07 kg/m   Weight change: @WEIGHTCHANGE @ Height:   Height: 5' 3.78" (162 cm)  Ht Readings from Last 3 Encounters:  08/02/18 5' 3.78" (1.62 m)  06/14/18 5\' 4"  (1.626 m)  03/29/18 5\' 4"  (1.626 m)    General appearance: alert, cooperative and appears stated age Head: Normocephalic, without obvious abnormality, atraumatic Neck: no adenopathy, supple, symmetrical, trachea midline and thyroid normal to inspection and palpation Lungs: clear to auscultation bilaterally Cardiovascular: regular rate and rhythm Breasts: normal appearance, no masses or tenderness Abdomen: soft, non-tender; non distended,  no masses,  no organomegaly Extremities: extremities normal, atraumatic, no cyanosis or edema Skin: Skin color, texture, turgor normal. No rashes or lesions Lymph nodes: Cervical, supraclavicular, and axillary nodes normal. No abnormal inguinal nodes palpated Neurologic: Grossly normal   Pelvic: External genitalia:  no lesions              Urethra:  normal appearing urethra with no masses, tenderness or lesions              Bartholins and Skenes: normal                 Vagina: normal appearing vagina with normal color and discharge, no lesions              Cervix: no lesions               Bimanual Exam:  Uterus:  normal size,  contour, position, consistency, mobility, non-tender and retroverted              Adnexa: no mass, fullness, tenderness               Rectovaginal: Confirms               Anus:  normal sphincter tone, no lesions  Chaperone was present for exam.  A:  Well Woman with normal exam  Menorrhagia on OCP's, normal TSH, not anemic. Interfering with work (having accidents). Worse with the mirena  Dysmenorrhea  H/O abnormal pap  P:   Pap with hpv  Continue  OCP's  Discussed option of doing nothing, continued OCP's, endometrial ablation, hysterectomy.  She is interested in the ablation, aware her cramps could get worse. Will set her up for hysteroscopy, D&C, endometrial ablation (will not biopsy her prior to ablation given benign pathology in 2/19, but I do want to make sure she hasn't grown another polyp so will do the hysteroscopy)  Still needs contraception, wants to stay on OCP's, recommended she use condoms (on topamax)  Discussed breast self exam  Discussed calcium and vit D intake  Diagnostic Mammogram due, will set up

## 2018-08-02 NOTE — Patient Instructions (Signed)
EXERCISE AND DIET:  We recommended that you start or continue a regular exercise program for good health. Regular exercise means any activity that makes your heart beat faster and makes you sweat.  We recommend exercising at least 30 minutes per day at least 3 days a week, preferably 4 or 5.  We also recommend a diet low in fat and sugar.  Inactivity, poor dietary choices and obesity can cause diabetes, heart attack, stroke, and kidney damage, among others.    ALCOHOL AND SMOKING:  Women should limit their alcohol intake to no more than 7 drinks/beers/glasses of wine (combined, not each!) per week. Moderation of alcohol intake to this level decreases your risk of breast cancer and liver damage. And of course, no recreational drugs are part of a healthy lifestyle.  And absolutely no smoking or even second hand smoke. Most people know smoking can cause heart and lung diseases, but did you know it also contributes to weakening of your bones? Aging of your skin?  Yellowing of your teeth and nails?  CALCIUM AND VITAMIN D:  Adequate intake of calcium and Vitamin D are recommended.  The recommendations for exact amounts of these supplements seem to change often, but generally speaking 600 mg of calcium (either carbonate or citrate) and 800 units of Vitamin D per day seems prudent. Certain women may benefit from higher intake of Vitamin D.  If you are among these women, your doctor will have told you during your visit.    PAP SMEARS:  Pap smears, to check for cervical cancer or precancers,  have traditionally been done yearly, although recent scientific advances have shown that most women can have pap smears less often.  However, every woman still should have a physical exam from her gynecologist every year. It will include a breast check, inspection of the vulva and vagina to check for abnormal growths or skin changes, a visual exam of the cervix, and then an exam to evaluate the size and shape of the uterus and  ovaries.  And after 43 years of age, a rectal exam is indicated to check for rectal cancers. We will also provide age appropriate advice regarding health maintenance, like when you should have certain vaccines, screening for sexually transmitted diseases, bone density testing, colonoscopy, mammograms, etc.   MAMMOGRAMS:  All women over 40 years old should have a yearly mammogram. Many facilities now offer a "3D" mammogram, which may cost around $50 extra out of pocket. If possible,  we recommend you accept the option to have the 3D mammogram performed.  It both reduces the number of women who will be called back for extra views which then turn out to be normal, and it is better than the routine mammogram at detecting truly abnormal areas.    COLONOSCOPY:  Colonoscopy to screen for colon cancer is recommended for all women at age 50.  We know, you hate the idea of the prep.  We agree, BUT, having colon cancer and not knowing it is worse!!  Colon cancer so often starts as a polyp that can be seen and removed at colonscopy, which can quite literally save your life!  And if your first colonoscopy is normal and you have no family history of colon cancer, most women don't have to have it again for 10 years.  Once every ten years, you can do something that may end up saving your life, right?  We will be happy to help you get it scheduled when you are ready.    Be sure to check your insurance coverage so you understand how much it will cost.  It may be covered as a preventative service at no cost, but you should check your particular policy.       Endometrial Ablation Endometrial ablation is a procedure that destroys the thin inner layer of the lining of the uterus (endometrium). This procedure may be done:  To stop heavy periods.  To stop bleeding that is causing anemia.  To control irregular bleeding.  To treat bleeding caused by small tumors (fibroids) in the endometrium.  This procedure is often an  alternative to major surgery, such as removal of the uterus and cervix (hysterectomy). As a result of this procedure:  You may not be able to have children. However, if you are premenopausal (you have not gone through menopause): ? You may still have a small chance of getting pregnant. ? You will need to use a reliable method of birth control after the procedure to prevent pregnancy.  You may stop having a menstrual period, or you may have only a small amount of bleeding during your period. Menstruation may return several years after the procedure.  Tell a health care provider about:  Any allergies you have.  All medicines you are taking, including vitamins, herbs, eye drops, creams, and over-the-counter medicines.  Any problems you or family members have had with the use of anesthetic medicines.  Any blood disorders you have.  Any surgeries you have had.  Any medical conditions you have. What are the risks? Generally, this is a safe procedure. However, problems may occur, including:  A hole (perforation) in the uterus or bowel.  Infection of the uterus, bladder, or vagina.  Bleeding.  Damage to other structures or organs.  An air bubble in the lung (air embolus).  Problems with pregnancy after the procedure.  Failure of the procedure.  Decreased ability to diagnose cancer in the endometrium.  What happens before the procedure?  You will have tests of your endometrium to make sure there are no pre-cancerous cells or cancer cells present.  You may have an ultrasound of the uterus.  You may be given medicines to thin the endometrium.  Ask your health care provider about: ? Changing or stopping your regular medicines. This is especially important if you take diabetes medicines or blood thinners. ? Taking medicines such as aspirin and ibuprofen. These medicines can thin your blood. Do not take these medicines before your procedure if your doctor tells you not to.  Plan  to have someone take you home from the hospital or clinic. What happens during the procedure?  You will lie on an exam table with your feet and legs supported as in a pelvic exam.  To lower your risk of infection: ? Your health care team will wash or sanitize their hands and put on germ-free (sterile) gloves. ? Your genital area will be washed with soap.  An IV tube will be inserted into one of your veins.  You will be given a medicine to help you relax (sedative).  A surgical instrument with a light and camera (resectoscope) will be inserted into your vagina and moved into your uterus. This allows your surgeon to see inside your uterus.  Endometrial tissue will be removed using one of the following methods: ? Radiofrequency. This method uses a radiofrequency-alternating electric current to remove the endometrium. ? Cryotherapy. This method uses extreme cold to freeze the endometrium. ? Heated-free liquid. This method uses a heated saltwater (  saline) solution to remove the endometrium. ? Microwave. This method uses high-energy microwaves to heat up the endometrium and remove it. ? Thermal balloon. This method involves inserting a catheter with a balloon tip into the uterus. The balloon tip is filled with heated fluid to remove the endometrium. The procedure may vary among health care providers and hospitals. What happens after the procedure?  Your blood pressure, heart rate, breathing rate, and blood oxygen level will be monitored until the medicines you were given have worn off.  As tissue healing occurs, you may notice vaginal bleeding for 4-6 weeks after the procedure. You may also experience: ? Cramps. ? Thin, watery vaginal discharge that is light pink or brown in color. ? A need to urinate more frequently than usual. ? Nausea.  Do not drive for 24 hours if you were given a sedative.  Do not have sex or insert anything into your vagina until your health care provider  approves. Summary  Endometrial ablation is done to treat the many causes of heavy menstrual bleeding.  The procedure may be done only after medications have been tried to control the bleeding.  Plan to have someone take you home from the hospital or clinic. This information is not intended to replace advice given to you by your health care provider. Make sure you discuss any questions you have with your health care provider. Document Released: 07/09/2004 Document Revised: 09/16/2016 Document Reviewed: 09/16/2016 Elsevier Interactive Patient Education  2017 [employer].

## 2018-08-04 ENCOUNTER — Other Ambulatory Visit: Payer: Self-pay | Admitting: Family Medicine

## 2018-08-04 MED ORDER — PREDNISONE 5 MG PO TABS: ORAL_TABLET | ORAL | 0 refills | Status: DC

## 2018-08-04 MED ORDER — HYDROCOD POLST-CPM POLST ER 10-8 MG/5ML PO SUER: 5.0000 mL | Freq: Every evening | ORAL | 0 refills | Status: DC | PRN

## 2018-08-04 MED ORDER — AZITHROMYCIN 250 MG PO TABS: ORAL_TABLET | ORAL | 0 refills | Status: DC

## 2018-08-04 MED FILL — HYDROCODONE-CHLORPHEN ER SU: 10-8 | 24 days supply | Qty: 120 | Fill #0

## 2018-08-04 MED FILL — predniSONE 5 MG (21) TBPK: 5 | 6 days supply | Qty: 21 | Fill #0

## 2018-08-04 MED FILL — FLUoxetine HCL 20 MG CAPS: 20 | 90 days supply | Qty: 90 | Fill #1

## 2018-08-04 MED FILL — AZITHROMYCIN 250 MG TABLET: 250 | 5 days supply | Qty: 6 | Fill #0

## 2018-08-08 LAB — CYTOLOGY - PAP
Diagnosis: NEGATIVE
HPV (WINDOPATH): DETECTED — AB

## 2018-08-09 ENCOUNTER — Ambulatory Visit
Admission: RE | Admit: 2018-08-09 | Discharge: 2018-08-09 | Disposition: A | Discharge status: Home / Self Care | Payer: No Typology Code available for payment source | Source: Ambulatory Visit | Attending: Obstetrics and Gynecology | Admitting: Obstetrics and Gynecology

## 2018-08-09 ENCOUNTER — Telehealth: Payer: Self-pay | Admitting: Obstetrics and Gynecology

## 2018-08-09 ENCOUNTER — Telehealth: Payer: Self-pay

## 2018-08-09 DIAGNOSIS — N6489 Other specified disorders of breast: Secondary | ICD-10-CM

## 2018-08-09 DIAGNOSIS — R921 Mammographic calcification found on diagnostic imaging of breast: Secondary | ICD-10-CM

## 2018-08-09 NOTE — Telephone Encounter (Signed)
Call placed to patient on 08/08/18 to review benefits for surgery. Left voicemail message requesting a return call

## 2018-08-09 NOTE — Progress Notes (Signed)
Erroneous encounter

## 2018-08-09 NOTE — Telephone Encounter (Deleted)
-----   Message from Romualdo Bolk, MD sent at 08/09/2018  4:05 PM EST ----- Please inform + for hpv and set her up for another colposcopy

## 2018-08-14 MED FILL — ALPRAZolam 0.5 MG TABS: 0.5 | 30 days supply | Qty: 30 | Fill #2

## 2018-08-14 MED FILL — BLISOVI FE 1/20 1-20 MG-MCG: 1-20 | 84 days supply | Qty: 84 | Fill #2

## 2018-08-14 NOTE — Telephone Encounter (Signed)
Left message to call Kaitlyn at [phone]. 

## 2018-08-14 NOTE — Telephone Encounter (Signed)
See previous phone note. Call placed to patient to review benefits for surgery. Left a voicemail message requesting a return call   cc: Billie Ruddy, RN

## 2018-08-14 NOTE — Telephone Encounter (Signed)
-----   Message from Jill Evelyn Jertson, MD sent at 08/09/2018  4:05 PM EST ----- Please inform + for hpv and set her up for another colposcopy 

## 2018-08-17 NOTE — Telephone Encounter (Signed)
Left message to call Phillip Maffei at [phone]. 

## 2018-08-21 NOTE — Telephone Encounter (Signed)
Please just send a letter advising her to call to schedule her colposcopy.

## 2018-08-21 NOTE — Telephone Encounter (Signed)
Letter pending.  Dr.Jertson do you want this to be a 30 day letter for the patient to call and schedule the colposcopy? Or just a letter advising of the results and need for colposcopy?

## 2018-08-22 ENCOUNTER — Encounter: Payer: Self-pay | Admitting: Obstetrics and Gynecology

## 2018-08-22 ENCOUNTER — Telehealth: Payer: Self-pay | Admitting: Obstetrics and Gynecology

## 2018-08-22 NOTE — Telephone Encounter (Signed)
Letter to Dr.Jertson. 

## 2018-08-22 NOTE — Telephone Encounter (Signed)
Responded to patient via mychart.  Routing to [employer] and Billie Ruddy RN.

## 2018-08-22 NOTE — Telephone Encounter (Signed)
Responded to patient via mychart.   Ms. Wigal,   My name is French Ana and I am a triage nurse with Dr. Oscar La. I reviewed the surgical information and our benefits coordinator has attempted to reach you via phone two times. Is there a better time for [place] to reach you or a time Estelle call you directly? We really need to be able to discuss your surgical benefits and then pre-op/post op planning with you via phone. I apologize for any inconvenience, we just want to be able to ensure you have all the information prior to planning surgery.   Sincerely,  Davonna Belling RN

## 2018-08-22 NOTE — Telephone Encounter (Signed)
Patient sent the following message through MyChart. Routing to triage to assist patient with request.   Good Morning,    I was in to see Dr. Oscar La for my annual exam on 08/02/2018, and we discussed me getting an endometrial ablation. I am looking forward to moving ahead with this procedure. Dr. Jacquelynn Cree stated that she would have someone contact me to go over this and set it up, but I haven't heard from anyone yet. I just wanted to touch base about this.    MyChart is normally the best way to contact me during the day because I am very busy at work and it is difficult for me to be on the phone.     If someone could get back to me, that would be great!    Thank you,    [facility]

## 2018-08-22 NOTE — Telephone Encounter (Signed)
Letter mailed to patient's home address on file. Encounter closed. °

## 2018-08-23 NOTE — Telephone Encounter (Signed)
My Chart message from patient on 08-22-18:     I'm not sure why, but I never received those calls or messages. I will try to figure out a time when I can call or receive a call. 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 I get a 30 minute lunch, if we don't run over into lunchtime. I will try to give you all a call if I have a free moment, or let you all know if it is possible for you to call me.   Thanks!  [name]   Message sent back to patient explaining she needed to call to review benefits and lab results.

## 2018-08-23 NOTE — Telephone Encounter (Signed)
Call to patient. Per [employer] can leave message on [phone]. Left message calling after work hours to try to reach her. Nothing wrong and no emergency. Also left message on [phone]. Voice mail has number confirmation on both numbers.

## 2018-08-26 ENCOUNTER — Encounter: Payer: Self-pay | Admitting: Sports Medicine

## 2018-08-26 IMAGING — MR MR HEAD WO/W CM
7 of 11 series · 27 of 48 positions shown · IV contrast (multihance)
Comparison: MRI head May 30, 2014

CLINICAL DATA: Bilateral tinnitus for 10 years, worsening for 2
months.

EXAM:
MRI HEAD WITHOUT AND WITH CONTRAST
TECHNIQUE: Multiplanar, multiecho pulse sequences of the brain and surrounding
structures were obtained without and with intravenous contrast.
CONTRAST:  14mL MULTIHANCE GADOBENATE DIMEGLUMINE 529 MG/ML IV SOLN

[Series 2: T1 · sagittal · 5.0mm · 0.45mm/px · 3 of 23 slices shown (1 of 3)]
[im 1/23]
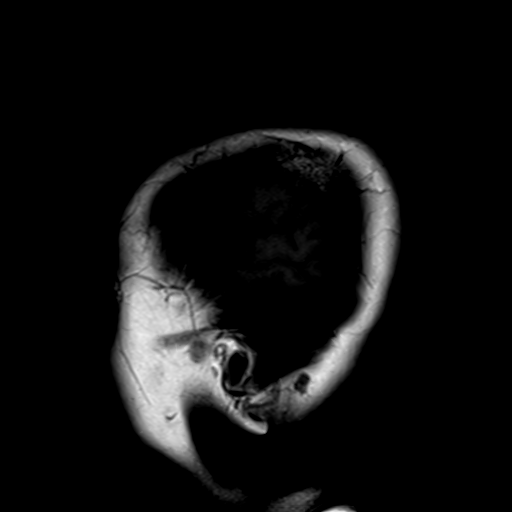
[im 12/23]
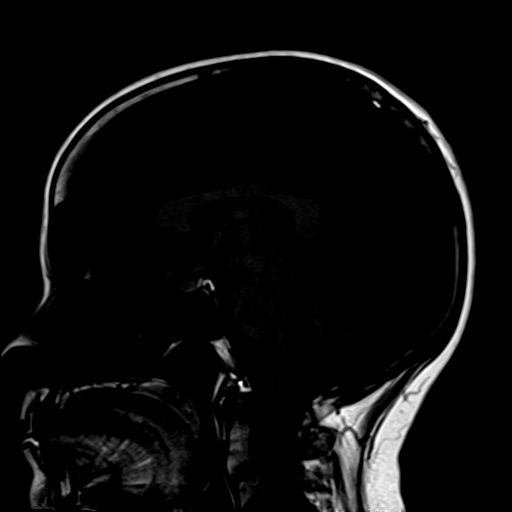
[im 23/23]
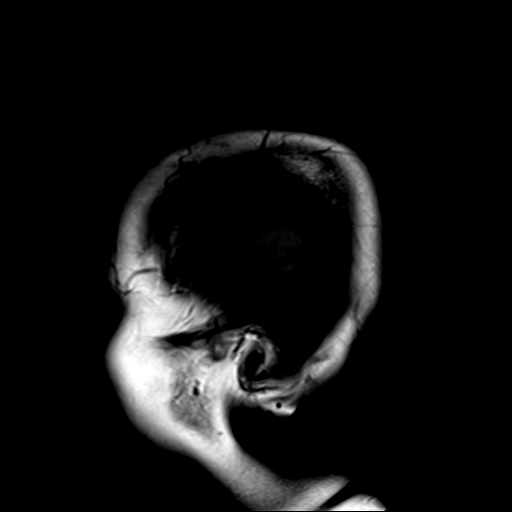

[Series 4: DWI · axial · 3.0mm · 1.80mm/px · z∈[-97,+63]mm · 6 of 42 slices shown]
[im 1/42]
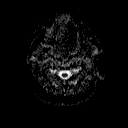
[im 9/42]
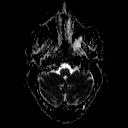
[im 17/42]
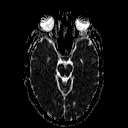
[im 25/42]
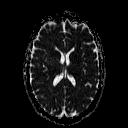
[im 33/42]
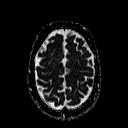
[im 42/42]
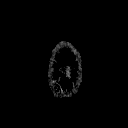

[Series 5: T2 · axial · 5.0mm · 0.60mm/px · z∈[-92,+57]mm · 4 of 24 slices shown (1 of 2)]
[im 1/24]
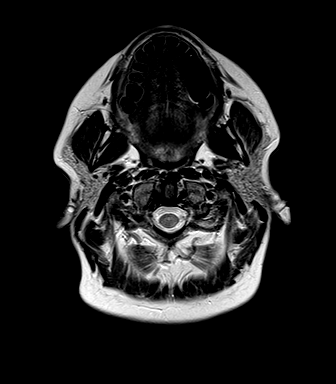
[im 8/24]
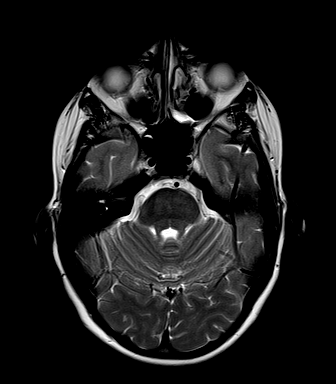
[im 16/24]
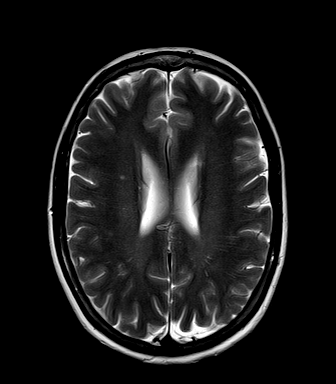
[im 24/24]
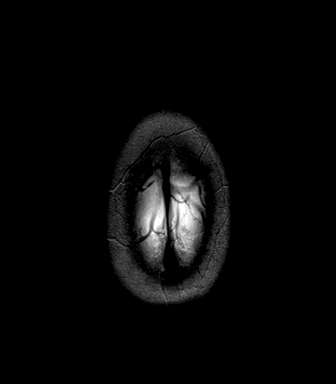

[Series 6: T1 · coronal · 3.0mm · 0.78mm/px · 2 of 11 slices shown (2 of 3)]
[im 1/11]
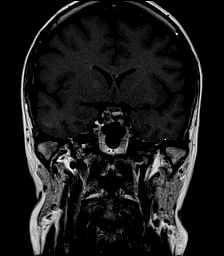
[im 11/11]
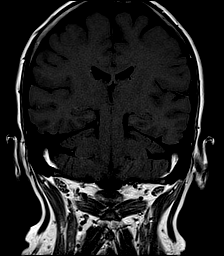

[Series 7: FLAIR · axial · 5.0mm · 0.45mm/px · z∈[-92,+58]mm · 4 of 24 slices shown]
[im 1/24]
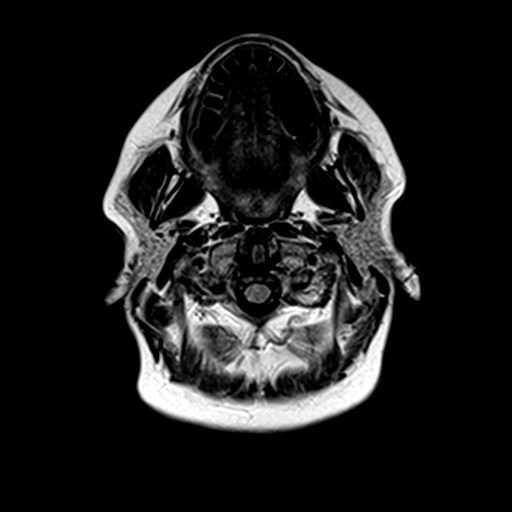
[im 8/24]
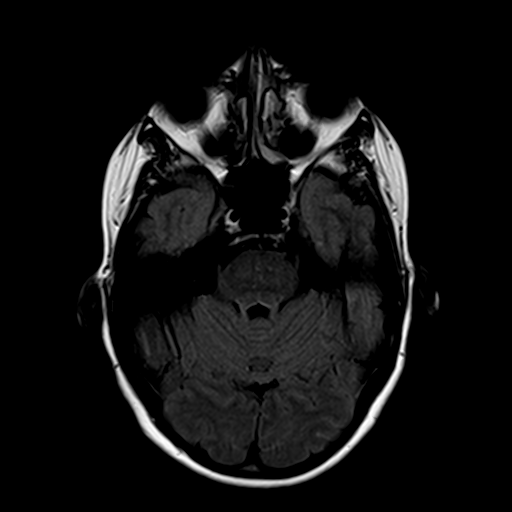
[im 16/24]
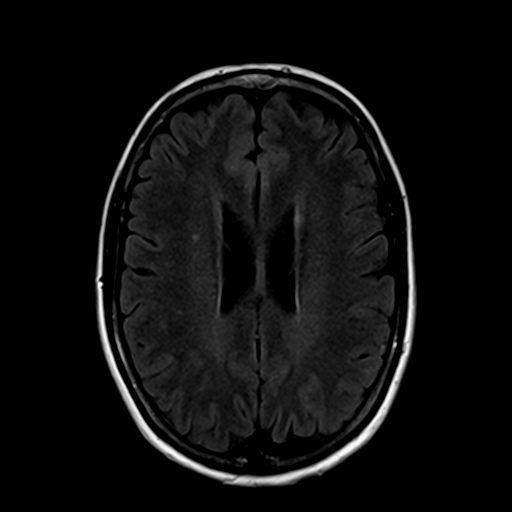
[im 24/24]
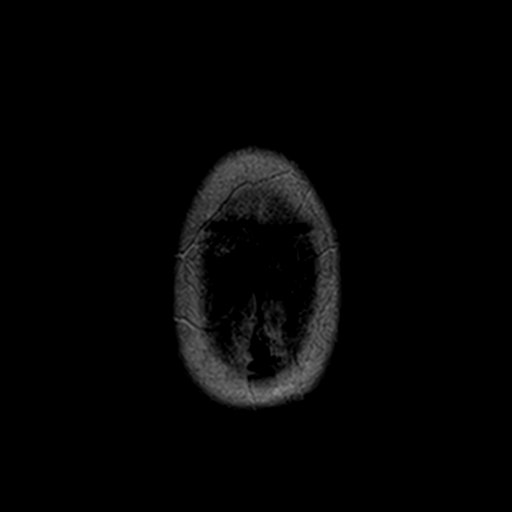

[Series 8: T2 · axial · 1.0mm · 0.35mm/px · z∈[-66,-27]mm · 7 of 40 slices shown (2 of 2)]
[im 1/40]
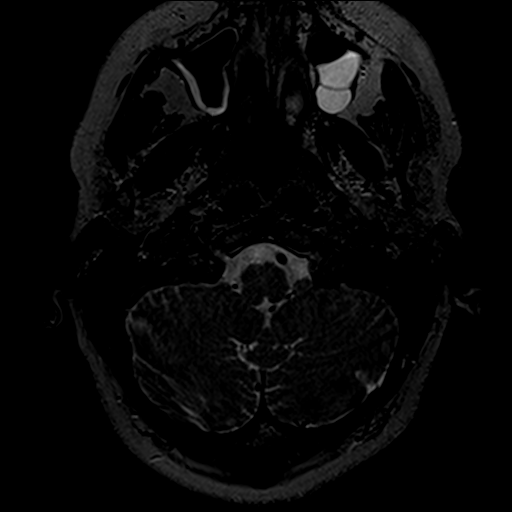
[im 7/40]
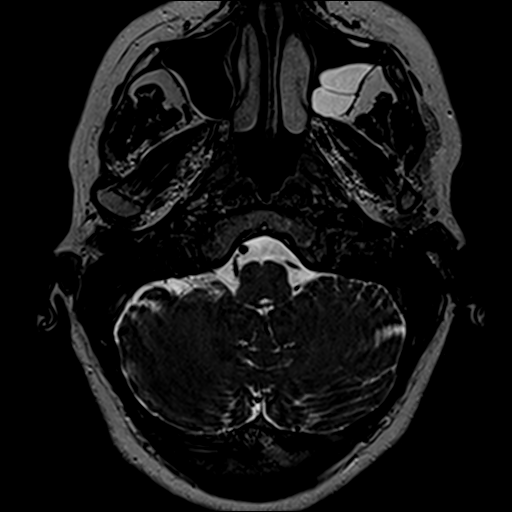
[im 14/40]
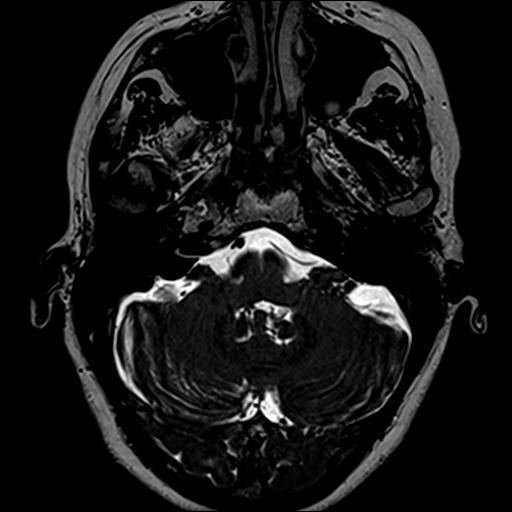
[im 20/40]
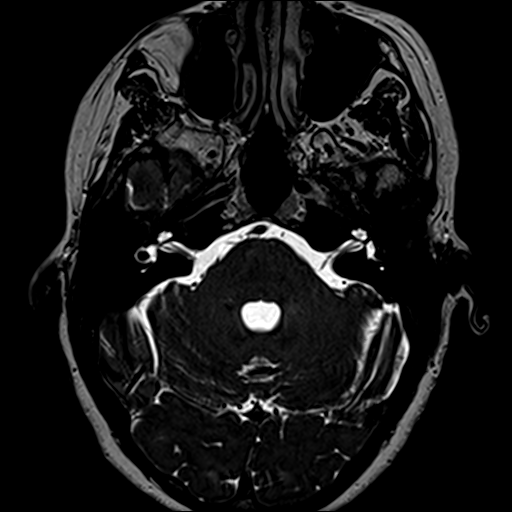
[im 27/40]
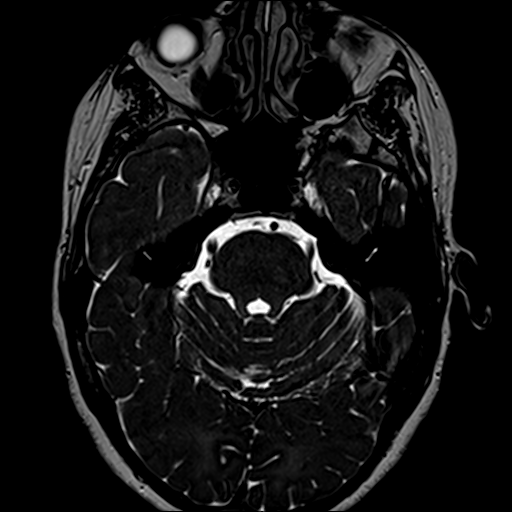
[im 33/40]
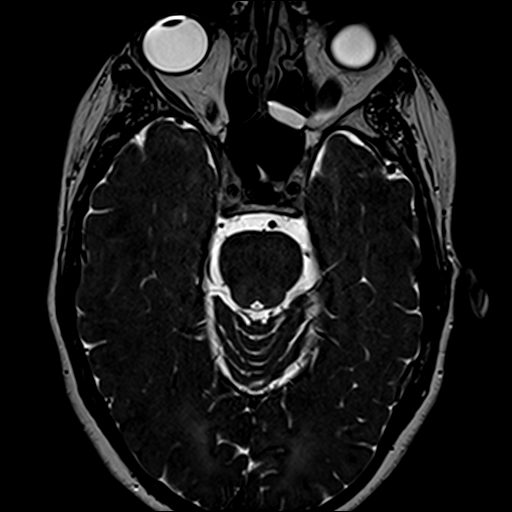
[im 40/40]
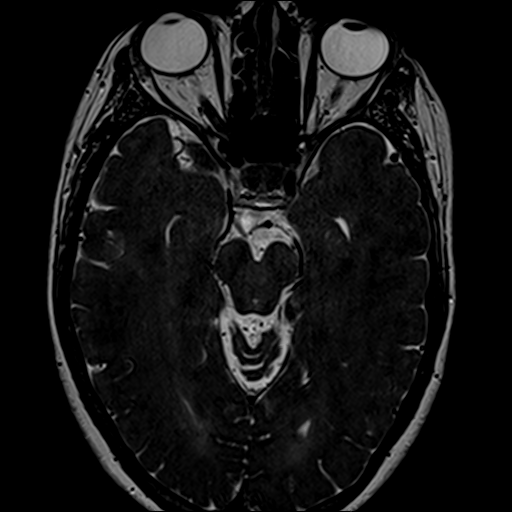

[Series 9: T1 · axial · 3.0mm · 0.39mm/px · 1 of 11 slices shown (3 of 3)]
[im 1/11]
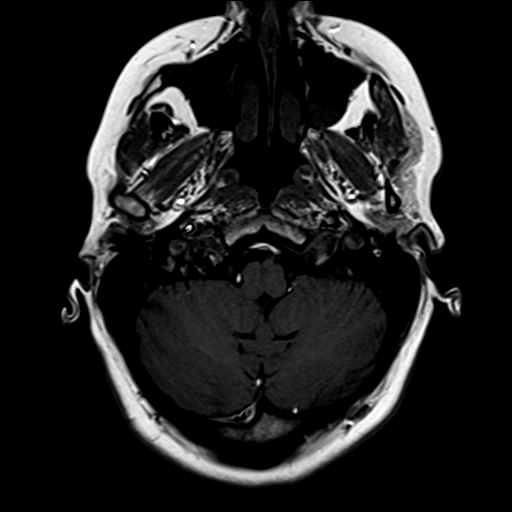

[27 of 48 positions shown; findings below may reference images not displayed]

FINDINGS: INTERNAL AUDITORY CANALS: No cerebellar pontine angle masses. Normal
course, caliber and signal of the bilateral seventh and eighth
cranial nerves without abnormal enhancement. Symmetric, normal
internal auditory canals without abnormal enhancement. Normal
appearance of the inner ear structures.

INTRACRANIAL CONTENTS: No reduced diffusion to suggest acute
ischemia or hypercellular tumor. No susceptibility artifact to
suggest hemorrhage. The ventricles and sulci are normal for
patient's age. Increased number of scattered supratentorial white
matter T2 hyperintensities, including sub cm white matter lesion
radiating from LEFT periventricular margin and, wedge-like T2
hyperintensity LEFT frontal subcortical white matter with low T1
signal. No abnormal intraparenchymal or extra-axial enhancement. No
abnormal extra-axial fluid collections. No extra-axial masses.

VASCULAR: Normal major intracranial vascular flow voids present at
skull base.

SKULL AND UPPER CERVICAL SPINE: No abnormal sellar expansion. No
suspicious calvarial bone marrow signal. Craniocervical junction
maintained.

SINUSES/ ORBITS: RIGHT maxillary mucosal thickening. Lobulated LEFT
maxillary mucosal retention cyst. Small LEFT posterior sphenoid
mucosal retention cyst. Mastoid air cells are well aerated.The
included ocular globes and orbital contents are non-suspicious.

OTHER: None.
IMPRESSION: Negative MRI of the internal auditory canals with and without
contrast.

Progressed white matter changes suspicious for chronic demyelination
or prior infection, possible underlying chronic small vessel
ischemic disease.

## 2018-09-01 NOTE — Telephone Encounter (Signed)
See new phone messages. Encounter closed.

## 2018-09-01 NOTE — Telephone Encounter (Signed)
No response from patient.   MyChart message sent.  

## 2018-09-04 NOTE — Telephone Encounter (Signed)
She does need a colposcopy, I could potentially do this in the OR. If she does her surgery after 2/20 she should have an endometrial biopsy first.

## 2018-09-04 NOTE — Telephone Encounter (Addendum)
Call to patient due to pap results and no response to My Chart message. Left message on home/ cell number. No details left. New My Chart message sent.

## 2018-09-04 NOTE — Telephone Encounter (Signed)
See My Chart communication from and reply to patient.

## 2018-09-11 ENCOUNTER — Other Ambulatory Visit: Payer: Self-pay | Admitting: Family Medicine

## 2018-09-11 MED FILL — TOPIRAMATE 50 MG TABLET: 50 | 90 days supply | Qty: 180 | Fill #1

## 2018-09-11 MED FILL — ALPRAZolam 0.5 MG TABS: 0.5 | 30 days supply | Qty: 30 | Fill #0

## 2018-09-11 NOTE — Telephone Encounter (Signed)
Ok for refill? Please advise 

## 2018-09-19 NOTE — Telephone Encounter (Signed)
Patient has not read last My Chart message. Call to patient. Left message to call back. ( Per ROI, can leave message on voice mail which has number confirmation. New message sent as well.

## 2018-09-20 ENCOUNTER — Other Ambulatory Visit: Payer: Self-pay | Admitting: Family Medicine

## 2018-09-20 DIAGNOSIS — K219 Gastro-esophageal reflux disease without esophagitis: Secondary | ICD-10-CM

## 2018-09-27 ENCOUNTER — Other Ambulatory Visit: Payer: Self-pay

## 2018-09-27 MED ORDER — TOPIRAMATE ER 100 MG PO CAP24: 100.0000 mg | ORAL_CAPSULE | Freq: Every day | ORAL | 2 refills | Status: DC

## 2018-09-27 MED FILL — TROKENDI XR 100 MG CAPSULE: 100 | 90 days supply | Qty: 90 | Fill #0

## 2018-09-28 NOTE — Telephone Encounter (Signed)
Multiple attempts to communicate with patient via phone calls and My Chart.  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 Had plans for Hysteroscopy/Novasure.  Unable to speak with patient to schedule either colpo and/or surgery.     Please advise on next step.

## 2018-10-01 NOTE — Telephone Encounter (Signed)
Please send her a letter

## 2018-10-02 ENCOUNTER — Encounter: Payer: Self-pay | Admitting: *Deleted

## 2018-10-02 MED FILL — ESOMEPRAZOLE MAG DR 40 MG C: 40 | 90 days supply | Qty: 90 | Fill #0

## 2018-10-02 NOTE — Telephone Encounter (Signed)
Letter to your desk for review.  

## 2018-10-04 NOTE — Telephone Encounter (Signed)
Letter reviewed and signed by Dr Oscar La. Mailed 10-03-2018. Encounter closed.

## 2018-10-09 MED FILL — PROPRANOLOL 20 MG TABLET: 20 | 90 days supply | Qty: 90 | Fill #2

## 2018-10-09 MED FILL — ALPRAZolam 0.5 MG TABS: 0.5 | 30 days supply | Qty: 30 | Fill #1

## 2018-10-24 ENCOUNTER — Other Ambulatory Visit: Payer: Self-pay | Admitting: Family Medicine

## 2018-10-24 MED ORDER — AMOXICILLIN 875 MG PO TABS: 875.0000 mg | ORAL_TABLET | Freq: Two times a day (BID) | ORAL | 0 refills | Status: DC

## 2018-10-24 MED ORDER — HYDROCOD POLST-CPM POLST ER 10-8 MG/5ML PO SUER: 5.0000 mL | Freq: Every evening | ORAL | 0 refills | Status: DC | PRN

## 2018-10-24 MED FILL — AMOXICILLIN 875 MG TABS: 875 | 10 days supply | Qty: 20 | Fill #0

## 2018-10-24 MED FILL — HYDROCODONE-CHLORPHEN ER SU: 10-8 | 24 days supply | Qty: 120 | Fill #0

## 2018-10-30 MED FILL — NORETHIN-ESTRAD-FERR 1-0.02: 1-20 | 84 days supply | Qty: 84 | Fill #3

## 2018-11-06 MED FILL — ALPRAZolam 0.5 MG TABS: 0.5 | 30 days supply | Qty: 30 | Fill #2

## 2018-11-06 MED FILL — FLUoxetine HCL 20 MG CAPS: 20 | 90 days supply | Qty: 90 | Fill #2

## 2018-11-10 ENCOUNTER — Other Ambulatory Visit: Payer: Self-pay | Admitting: Family Medicine

## 2018-11-10 ENCOUNTER — Other Ambulatory Visit: Payer: Self-pay

## 2018-11-10 MED ORDER — OSELTAMIVIR PHOSPHATE 75 MG PO CAPS: 75.0000 mg | ORAL_CAPSULE | Freq: Two times a day (BID) | ORAL | 0 refills | Status: DC

## 2018-11-10 MED ORDER — ONDANSETRON 4 MG PO TBDP: 4.0000 mg | ORAL_TABLET | Freq: Three times a day (TID) | ORAL | 0 refills | Status: DC | PRN

## 2018-11-10 MED ORDER — HYDROCOD POLST-CPM POLST ER 10-8 MG/5ML PO SUER: 5.0000 mL | Freq: Every evening | ORAL | 0 refills | Status: DC | PRN

## 2018-11-10 MED FILL — ONDANSETRON ODT 4 MG TABLET: 4 | 6 days supply | Qty: 20 | Fill #0

## 2018-11-10 MED FILL — OSELTAMIVIR PHOSPHATE 75 MG: 75 | 5 days supply | Qty: 10 | Fill #0

## 2018-11-15 MED FILL — HYDROCODONE-CHLORPHEN ER SU: 10-8 | 12 days supply | Qty: 60 | Fill #0

## 2018-11-30 ENCOUNTER — Other Ambulatory Visit: Payer: Self-pay

## 2018-11-30 MED ORDER — ALPRAZOLAM 0.5 MG PO TABS: ORAL_TABLET | ORAL | 2 refills | Status: DC

## 2018-11-30 NOTE — Telephone Encounter (Signed)
Ok to fill 

## 2018-12-04 MED FILL — ALPRAZolam 0.5 MG TABS: 0.5 | 30 days supply | Qty: 30 | Fill #0

## 2018-12-26 ENCOUNTER — Other Ambulatory Visit: Payer: Self-pay | Admitting: Family Medicine

## 2018-12-26 MED FILL — PROPRANOLOL 20 MG TABLET: 20 | 90 days supply | Qty: 90 | Fill #3

## 2018-12-26 MED FILL — ESOMEPRAZOLE MAG DR 40 MG C: 40 | 90 days supply | Qty: 90 | Fill #1

## 2018-12-26 MED FILL — TROKENDI XR 100 MG CAPSULE: 100 | 90 days supply | Qty: 90 | Fill #1

## 2019-01-01 MED FILL — ALPRAZolam 0.5 MG TABS: 0.5 | 30 days supply | Qty: 30 | Fill #1

## 2019-01-06 ENCOUNTER — Other Ambulatory Visit: Payer: Self-pay | Admitting: Family Medicine

## 2019-01-06 MED ORDER — HYDROCODONE-ACETAMINOPHEN 5-325 MG PO TABS: 1.0000 | ORAL_TABLET | Freq: Four times a day (QID) | ORAL | 0 refills | Status: DC | PRN

## 2019-01-06 MED ORDER — AMOXICILLIN 875 MG PO TABS: 875.0000 mg | ORAL_TABLET | Freq: Two times a day (BID) | ORAL | 0 refills | Status: DC

## 2019-01-29 MED FILL — ALPRAZolam 0.5 MG TABS: 0.5 | 30 days supply | Qty: 30 | Fill #2

## 2019-02-13 ENCOUNTER — Other Ambulatory Visit: Payer: Self-pay | Admitting: Family Medicine

## 2019-02-26 ENCOUNTER — Encounter: Payer: Self-pay | Admitting: Family Medicine

## 2019-02-26 MED ORDER — PREDNISONE 5 MG PO TABS: ORAL_TABLET | ORAL | 0 refills | Status: DC

## 2019-02-26 MED ORDER — BACLOFEN 20 MG PO TABS: 20.0000 mg | ORAL_TABLET | Freq: Three times a day (TID) | ORAL | 0 refills | Status: DC

## 2019-02-26 MED ORDER — HYDROCODONE-ACETAMINOPHEN 5-325 MG PO TABS: 1.0000 | ORAL_TABLET | Freq: Four times a day (QID) | ORAL | 0 refills | Status: DC | PRN

## 2019-02-27 MED FILL — BLISOVI FE 1/20 1-20 MG-MCG: 1-20 | 84 days supply | Qty: 84 | Fill #0

## 2019-02-27 MED FILL — BACLOFEN 20 MG TABS: 20 | 10 days supply | Qty: 30 | Fill #0

## 2019-02-27 MED FILL — HYDROCODON-APAP 5-325: 5-325 | 6 days supply | Qty: 24 | Fill #0

## 2019-02-27 MED FILL — predniSONE 5 MG (21) TBPK: 5 | 6 days supply | Qty: 21 | Fill #0

## 2019-02-27 MED FILL — ALPRAZolam 0.5 MG TABS: 0.5 | 30 days supply | Qty: 30 | Fill #0

## 2019-03-01 ENCOUNTER — Ambulatory Visit (INDEPENDENT_AMBULATORY_CARE_PROVIDER_SITE_OTHER): Payer: No Typology Code available for payment source

## 2019-03-01 ENCOUNTER — Other Ambulatory Visit: Payer: Self-pay

## 2019-03-01 ENCOUNTER — Ambulatory Visit: Payer: No Typology Code available for payment source | Admitting: Family Medicine

## 2019-03-01 ENCOUNTER — Encounter: Payer: Self-pay | Admitting: Family Medicine

## 2019-03-01 VITALS — BP 104/70 | HR 64 | Ht 63.0 in | Wt 172.0 lb

## 2019-03-01 DIAGNOSIS — M542 Cervicalgia: Secondary | ICD-10-CM | POA: Diagnosis not present

## 2019-03-01 DIAGNOSIS — M5412 Radiculopathy, cervical region: Secondary | ICD-10-CM | POA: Diagnosis not present

## 2019-03-01 MED ORDER — GABAPENTIN 100 MG PO CAPS: 200.0000 mg | ORAL_CAPSULE | Freq: Every day | ORAL | 3 refills | Status: DC

## 2019-03-01 MED ORDER — KETOROLAC TROMETHAMINE 60 MG/2ML IM SOLN
60.0000 mg | Freq: Once | INTRAMUSCULAR | Status: AC
  Administered 2019-03-01: 60 mg via INTRAMUSCULAR

## 2019-03-01 MED ORDER — TIZANIDINE HCL 4 MG PO CAPS: ORAL_CAPSULE | ORAL | 0 refills | Status: DC

## 2019-03-01 MED ORDER — METHYLPREDNISOLONE ACETATE 80 MG/ML IJ SUSP
80.0000 mg | Freq: Once | INTRAMUSCULAR | Status: AC
  Administered 2019-03-01: 12:00:00 80 mg via INTRAMUSCULAR

## 2019-03-01 MED FILL — tiZANidine HCL 4 MG TABS: 4 | 30 days supply | Qty: 30 | Fill #0

## 2019-03-01 MED FILL — GABAPENTIN 100 MG CAPSULE: 100 | 90 days supply | Qty: 180 | Fill #0

## 2019-03-01 NOTE — Assessment & Plan Note (Signed)
Cervical radiculopathy.  Patient in great length about icing regimen, home exercises, start gabapentin.  We discussed the possibility of oral anti-inflammatories for short-term.  Also given muscle relaxer for breakthrough.  Follow-up again in 4 to 6 weeks

## 2019-03-01 NOTE — Progress Notes (Signed)
[facility] [address] [place], [place] [address] Phone: [phone] Subjective:   Bruce Donath, am serving as a scribe for Dr. Antoine Primas.  I'm seeing this patient by the request  of:    CC: neck pain   IFO:[id number]  [name] is a 43 y.o. female coming in with complaint of neck pain. Patient states that she is having right sided neck pain. Patient is left handed. Has had OMT from Dr. Berline Chough. Last week her pain increased. Over the weekend she developed pain that radiated from her neck down into her right hand. Has not had this symptom before. Has noticed marked tightness in the right trap. Feels that the musculature on the right side of thoracic spine is now spasming also. Has used IBU. Has been on baclofen and prednisone taper. Has not noticed a difference with either medication. Also using Norco at night. Is sleeping a little each night.   Onset-  Location Duration-  Character- Aggravating factors- Reliving factors-  Therapies tried-  Severity-     Past Medical History:  Diagnosis Date  . Anxiety   . Carpal tunnel syndrome   . Chronic lower back pain   . Chronic tension headaches   . Constipation   . Dysmenorrhea   . Endometrial polyp   . Fatigue   . GERD (gastroesophageal reflux disease)   . History of lumbar puncture 08/2016  . HPV in female   . Low serum vitamin D   . Neck pain, chronic   . Pelvic pain   . Thyroid nodule   . Tinnitus, bilateral   . TMJ (temporomandibular joint disorder)   . White matter abnormality on MRI of brain    Past Surgical History:  Procedure Laterality Date  . DE QUERVAIN'S RELEASE Bilateral   . DILATATION & CURETTAGE/HYSTEROSCOPY WITH MYOSURE N/A 10/24/2017   Procedure: DILATATION & CURETTAGE/HYSTEROSCOPY WITH MYOSURE;  Surgeon: Romualdo Bolk, MD;  Location: [facility] [place];  Service: Gynecology;  Laterality: N/A;  . IUD REMOVAL N/A 10/24/2017   Procedure: INTRAUTERINE  DEVICE (IUD) REMOVAL;  Surgeon: Romualdo Bolk, MD;  [facility];  Service: Gynecology;  Laterality: N/A;  . TONSILLECTOMY    . WISDOM TOOTH EXTRACTION    . WRIST SURGERY Bilateral    2 ganglion cyst removed   Social History   Socioeconomic History  . Marital status: Divorced    Spouse name: Not on file  . Number of children: Not on file  . Years of education: Not on file  . Highest education level: Not on file  Occupational History  . Not on file  Social Needs  . Financial resource strain: Not on file  . Food insecurity    Worry: Not on file    Inability: Not on file  . Transportation needs    Medical: Not on file    Non-medical: Not on file  Tobacco Use  . Smoking status: Former Smoker    Packs/day: 18.00    Years: 1.00    Pack years: 18.00    Quit date: 12/29/2012    Years since quitting: 6.1  . Smokeless tobacco: Never Used  Substance and Sexual Activity  . Alcohol use: Yes    Alcohol/week: 1.0 - 2.0 standard drinks    Types: 1 - 2 Standard drinks or equivalent per week  . Drug use: No  . Sexual activity: Yes    Partners: Male    Birth control/protection:  Pill  Lifestyle  . Physical activity    Days per week: Not on file    Minutes per session: Not on file  . Stress: Not on file  Relationships  . Social Musicianconnections    Talks on phone: Not on file    Gets together: Not on file    Attends religious service: Not on file    Active member of club or organization: Not on file    Attends meetings of clubs or organizations: Not on file    Relationship status: Not on file  Other Topics Concern  . Not on file  Social History Narrative  . Not on file   Allergies  Allergen Reactions  . Codeine Nausea Only  . 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  Current Outpatient Medications (Endocrine & Metabolic):  .  norethindrone-ethinyl estradiol (JUNEL FE,GILDESS  FE,LOESTRIN FE) 1-20 MG-MCG tablet, TAKE 1 TABLET BY MOUTH ONCE DAILY .  predniSONE (DELTASONE) 5 MG tablet, 6-5-4-3-2-1-0  Current Outpatient Medications (Cardiovascular):  .  propranolol (INDERAL) 20 MG tablet, Take 1 tablet (20 mg total) by mouth daily as needed.  Current Outpatient Medications (Respiratory):  .  montelukast (SINGULAIR) 10 MG tablet, Take 1 tablet (10 mg total) by mouth at bedtime.  Current Outpatient Medications (Analgesics):  .  HYDROcodone-acetaminophen (NORCO/VICODIN) 5-325 MG tablet, Take 1 tablet by mouth every 6 (six) hours as needed for moderate pain.   Current Outpatient Medications (Other):  Marland Kitchen.  ALPRAZolam (XANAX) 0.5 MG tablet, TAKE 1 TABLET BY MOUTH ONCE DAILY AT BEDTIME AS NEEDED FOR ANXIETY .  amoxicillin (AMOXIL) 875 MG tablet, Take 1 tablet (875 mg total) by mouth 2 (two) times daily. .  baclofen (LIORESAL) 20 MG tablet, Take 1 tablet (20 mg total) by mouth 3 (three) times daily. Marland Kitchen.  esomeprazole (NEXIUM) 40 MG capsule, TAKE 1 CAPSULE BY MOUTH ONCE DAILY .  FLUoxetine (PROZAC) 20 MG capsule, Take 1 capsule (20 mg total) by mouth daily. .  Multiple Vitamin (MULTIVITAMIN WITH MINERALS) TABS, Take 1 tablet by mouth daily. .  Topiramate ER (TROKENDI XR) 100 MG CP24, Take 100 mg by mouth daily. Marland Kitchen.  gabapentin (NEURONTIN) 100 MG capsule, Take 2 capsules (200 mg total) by mouth at bedtime. Marland Kitchen.  tiZANidine (ZANAFLEX) 4 MG capsule, 1 tablet at night    Past medical history, social, surgical and family history all reviewed in electronic medical record.  No pertanent information unless stated regarding to the chief complaint.   Review of Systems:  No headache, visual changes, nausea, vomiting, diarrhea, constipation, dizziness, abdominal pain, skin rash, fevers, chills, night sweats, weight loss, swollen lymph nodes, body aches, joint swelling,  chest pain, shortness of breath, mood changes.  Positive muscle aches  Objective  Blood pressure 104/70, pulse 64, height  5\' 3"  (1.6 m), weight 172 lb (78 kg), SpO2 98 %.    General: No apparent distress alert and oriented x3 mood and affect normal, dressed appropriately.  HEENT: Pupils equal, extraocular movements intact  Respiratory: Patient's speak in full sentences and does not appear short of breath  Cardiovascular: No lower extremity edema, non tender, no erythema  Skin: Warm dry intact with no signs of infection or rash on extremities or on axial skeleton.  Abdomen: Soft nontender  Neuro: Cranial nerves II through XII are intact, neurovascularly intact in all extremities with 2+ DTRs and 2+ pulses.  Lymph: No lymphadenopathy of posterior or anterior  cervical chain or axillae bilaterally.  Gait normal with good balance and coordination.  MSK:  Non tender with full range of motion and good stability and symmetric strength and tone of shoulders, elbows, wrist, hip, knee and ankles bilaterally.  Neck: Inspection loss of lordosis. No palpable stepoffs. Mild positive Spurling's maneuver radicular symptoms in the C7 distribution. Significant guarding patient does have some limited range of motion in all planes Grip strength and sensation normal in bilateral hands Strength good C4 to T1 distribution No sensory change to C4 to T1 Negative Hoffman sign bilaterally Reflexes normal Tightness of the trapezius bilaterally   Impression and Recommendations:     This case required medical decision making of moderate complexity. The above documentation has been reviewed and is accurate and complete Lyndal Pulley, DO       Note: This dictation was prepared with Dragon dictation along with smaller phrase technology. Any transcriptional errors that result from this process are unintentional.

## 2019-03-01 NOTE — Patient Instructions (Signed)
Good to see you.  Ice 20 minutes 2 times daily. Usually after activity and before bed. Duexis 1 pill 3 times a day for 3 days starting tomorrow  See me again in 3 weeks

## 2019-03-06 ENCOUNTER — Encounter: Payer: Self-pay | Admitting: Family Medicine

## 2019-03-07 MED ORDER — TRAMADOL HCL 50 MG PO TABS: 50.0000 mg | ORAL_TABLET | Freq: Three times a day (TID) | ORAL | 0 refills | Status: AC | PRN

## 2019-03-07 MED FILL — traMADol HCL 50 MG TABS: 50 | 5 days supply | Qty: 15 | Fill #0

## 2019-03-07 MED FILL — FLUoxetine HCL 20 MG CAPS: 20 | 90 days supply | Qty: 90 | Fill #3

## 2019-03-09 ENCOUNTER — Ambulatory Visit: Payer: No Typology Code available for payment source | Admitting: Family Medicine

## 2019-03-21 ENCOUNTER — Encounter: Payer: Self-pay | Admitting: Family Medicine

## 2019-03-21 ENCOUNTER — Other Ambulatory Visit: Payer: Self-pay

## 2019-03-21 ENCOUNTER — Ambulatory Visit: Payer: No Typology Code available for payment source | Admitting: Family Medicine

## 2019-03-21 DIAGNOSIS — M999 Biomechanical lesion, unspecified: Secondary | ICD-10-CM | POA: Diagnosis not present

## 2019-03-21 DIAGNOSIS — M5412 Radiculopathy, cervical region: Secondary | ICD-10-CM

## 2019-03-21 MED ORDER — DUEXIS 800-26.6 MG PO TABS: 1.0000 | ORAL_TABLET | Freq: Three times a day (TID) | ORAL | 3 refills | Status: DC | PRN

## 2019-03-21 NOTE — Assessment & Plan Note (Signed)
Decision today to treat with OMT was based on Physical Exam  After verbal consent patient was treated with HVLA, ME, FPR techniques in cervical, thoracic, rib lumbar and sacral areas  Patient tolerated the procedure well with improvement in symptoms  Patient given exercises, stretches and lifestyle modifications  See medications in patient instructions if given  Patient will follow up in 4-8 weeks 

## 2019-03-21 NOTE — Patient Instructions (Signed)
Good to see you  You are doing grewat  Duexis still ok  Muscle relaxer when you need it Ice 20 minutes 2 times daily. Usually after activity and before bed. Did manipulation today and I hope it helps See me again in 4-6 weeks

## 2019-03-21 NOTE — Progress Notes (Signed)
[name] [place], [place] [address] Phone: [phone] Subjective:   [name], am serving as a scribe for Dr. Hulan Saas.   CC: Neck pain follow-up  [id number]   03/01/2019: Cervical radiculopathy.  Patient in great length about icing regimen, home exercises, start gabapentin.  We discussed the possibility of oral anti-inflammatories for short-term.  Also given muscle relaxer for breakthrough.  Follow-up again in 4 to 6 weeks  Update 03/21/2019: [name] is a 43 y.o. female coming in with complaint of neck pain. Patient states that the tightness has improved. Is still having limited ROM to the right side including rotation and side bending. Denies any radiating symptoms. Has been using gabapentin and tizanadine. Also using Duexis which helps during the day.    X-rays of patient's neck were independently visualized by me showing [name] significant bony abnormality  Past Medical History:  Diagnosis Date  . Anxiety   . Carpal tunnel syndrome   . Chronic lower back pain   . Chronic tension headaches   . Constipation   . Dysmenorrhea   . Endometrial polyp   . Fatigue   . GERD (gastroesophageal reflux disease)   . History of lumbar puncture 08/2016  . HPV in female   . Low serum vitamin D   . Neck pain, chronic   . Pelvic pain   . Thyroid nodule   . Tinnitus, bilateral   . TMJ (temporomandibular joint disorder)   . White matter abnormality on MRI of brain    Past Surgical History:  Procedure Laterality Date  . DE QUERVAIN'S RELEASE Bilateral   . DILATATION & CURETTAGE/HYSTEROSCOPY WITH MYOSURE N/A 10/24/2017   Procedure: DILATATION & CURETTAGE/HYSTEROSCOPY WITH MYOSURE;  Surgeon: Salvadore Dom, MD;  Location: [facility];  Service: Gynecology;  Laterality: N/A;  . IUD REMOVAL N/A 10/24/2017   Procedure: INTRAUTERINE DEVICE (IUD) REMOVAL;  Surgeon: Salvadore Dom, MD;  [facility];  Service: Gynecology;  Laterality: N/A;  . TONSILLECTOMY    . WISDOM TOOTH EXTRACTION    . WRIST SURGERY Bilateral    2 ganglion cyst removed   Social History   Socioeconomic History  . Marital status: Divorced    Spouse name: Not on file  . Number of children: Not on file  . Years of education: Not on file  . Highest education level: Not on file  Occupational History  . Not on file  Social Needs  . Financial resource strain: Not on file  . Food insecurity    Worry: Not on file    Inability: Not on file  . Transportation needs    Medical: Not on file    Non-medical: Not on file  Tobacco Use  . Smoking status: Former Smoker    Packs/day: 18.00    Years: 1.00    Pack years: 18.00    Quit date: 12/29/2012    Years since quitting: 6.2  . Smokeless tobacco: Never Used  Substance and Sexual Activity  . Alcohol use: Yes    Alcohol/week: 1.0 - 2.0 standard drinks    Types: 1 - 2 Standard drinks or equivalent per week  . Drug use: [name]  . Sexual activity: Yes    Partners: Male    Birth control/protection: Pill  Lifestyle  . Physical activity    Days per week: Not on file    Minutes per session: Not on file  . Stress: Not on  file  Relationships  . Social Musicianconnections    Talks on phone: Not on file    Gets together: Not on file    Attends religious service: Not on file    Active member of club or organization: Not on file    Attends meetings of clubs or organizations: Not on file    Relationship status: Not on file  Other Topics Concern  . Not on file  Social History Narrative  . Not on file   Allergies  Allergen Reactions  . Codeine Nausea Only  . 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  Current Outpatient Medications (Endocrine & Metabolic):  .  norethindrone-ethinyl estradiol (JUNEL FE,GILDESS FE,LOESTRIN FE) 1-20 MG-MCG tablet, TAKE 1 TABLET BY MOUTH ONCE DAILY .  predniSONE  (DELTASONE) 5 MG tablet, 6-5-4-3-2-1-0  Current Outpatient Medications (Cardiovascular):  .  propranolol (INDERAL) 20 MG tablet, Take 1 tablet (20 mg total) by mouth daily as needed.  Current Outpatient Medications (Respiratory):  .  montelukast (SINGULAIR) 10 MG tablet, Take 1 tablet (10 mg total) by mouth at bedtime.  Current Outpatient Medications (Analgesics):  .  HYDROcodone-acetaminophen (NORCO/VICODIN) 5-325 MG tablet, Take 1 tablet by mouth every 6 (six) hours as needed for moderate pain.   Current Outpatient Medications (Other):  Marland Kitchen.  ALPRAZolam (XANAX) 0.5 MG tablet, TAKE 1 TABLET BY MOUTH ONCE DAILY AT BEDTIME AS NEEDED FOR ANXIETY .  amoxicillin (AMOXIL) 875 MG tablet, Take 1 tablet (875 mg total) by mouth 2 (two) times daily. .  baclofen (LIORESAL) 20 MG tablet, Take 1 tablet (20 mg total) by mouth 3 (three) times daily. Marland Kitchen.  esomeprazole (NEXIUM) 40 MG capsule, TAKE 1 CAPSULE BY MOUTH ONCE DAILY .  FLUoxetine (PROZAC) 20 MG capsule, Take 1 capsule (20 mg total) by mouth daily. Marland Kitchen.  gabapentin (NEURONTIN) 100 MG capsule, Take 2 capsules (200 mg total) by mouth at bedtime. .  Multiple Vitamin (MULTIVITAMIN WITH MINERALS) TABS, Take 1 tablet by mouth daily. Marland Kitchen.  tiZANidine (ZANAFLEX) 4 MG capsule, 1 tablet at night .  Topiramate ER (TROKENDI XR) 100 MG CP24, Take 100 mg by mouth daily.    Past medical history, social, surgical and family history all reviewed in electronic medical record.  [name] pertanent information unless stated regarding to the chief complaint.   Review of Systems:  [name] headache, visual changes, nausea, vomiting, diarrhea, constipation, dizziness, abdominal pain, skin rash, fevers, chills, night sweats, weight loss, swollen lymph nodes, body aches, joint swelling, muscle aches, chest pain, shortness of breath, mood changes.   Objective  There were [name] vitals taken for this visit. Systems examined below as of    General: [name] apparent distress alert and oriented x3  mood and affect normal, dressed appropriately.  HEENT: Pupils equal, extraocular movements intact  Respiratory: Patient's speak in full sentences and does not appear short of breath  Cardiovascular: [name] lower extremity edema, non tender, [name] erythema  Skin: Warm dry intact with [name] signs of infection or rash on extremities or on axial skeleton.  Abdomen: Soft nontender  Neuro: Cranial nerves II through XII are intact, neurovascularly intact in all extremities with 2+ DTRs and 2+ pulses.  Lymph: [name] lymphadenopathy of posterior or anterior cervical chain or axillae bilaterally.  Gait normal with good balance and coordination.  MSK:  Non tender with full range of motion and good stability and symmetric strength and tone of shoulders, elbows, wrist, hip, knee  and ankles bilaterally.  Neck: Inspection loss of lordosis. [name] palpable stepoffs. Negative Spurling's maneuver. Mild difficulty with right-sided sidebending and rotation Grip strength and sensation normal in bilateral hands Strength good C4 to T1 distribution [name] sensory change to C4 to T1 Negative Hoffman sign bilaterally Reflexes normal Tightness of the trapezius bilaterally  Osteopathic findings C2 flexed rotated and side bent right C4 flexed rotated and side bent left C7 flexed rotated and side bent left T3 extended rotated and side bent right inhaled third rib T7 extended rotated and side bent left      Impression and Recommendations:     This case required medical decision making of moderate complexity. The above documentation has been reviewed and is accurate and complete Judi SaaZachary M Georga Stys, DO       Note: This dictation was prepared with Dragon dictation along with smaller phrase technology. Any transcriptional errors that result from this process are unintentional.

## 2019-03-21 NOTE — Assessment & Plan Note (Signed)
Patient has no longer any radiculopathy.  More distal muscle tightness.  Responded fairly well to osteopathic manipulation.  Discussed the gabapentin at night and regularly.  Discussed topical anti-inflammatories and icing regimen.  Follow-up again in 4 to 8 weeks

## 2019-03-22 ENCOUNTER — Other Ambulatory Visit: Payer: Self-pay | Admitting: Family Medicine

## 2019-03-22 DIAGNOSIS — K219 Gastro-esophageal reflux disease without esophagitis: Secondary | ICD-10-CM

## 2019-03-22 MED FILL — ESOMEPRAZOLE MAG DR 40 MG C: 40 | 90 days supply | Qty: 90 | Fill #0

## 2019-03-22 MED FILL — BACLOFEN 20 MG TABLET: 20 | 10 days supply | Qty: 30 | Fill #0

## 2019-03-22 MED FILL — TROKENDI XR 100 MG CAPSULE: 100 | 90 days supply | Qty: 90 | Fill #2

## 2019-03-22 MED FILL — PROPRANOLOL 20 MG TABLET: 20 | 90 days supply | Qty: 90 | Fill #0

## 2019-03-27 MED FILL — ALPRAZolam 0.5 MG TABS: 0.5 | 30 days supply | Qty: 30 | Fill #1

## 2019-04-05 ENCOUNTER — Other Ambulatory Visit: Payer: Self-pay | Admitting: Family Medicine

## 2019-04-05 NOTE — Telephone Encounter (Signed)
Last fill 03/22/19  #30/0

## 2019-04-07 MED FILL — BACLOFEN 20 MG TABLET: 20 | 10 days supply | Qty: 30 | Fill #0

## 2019-04-09 MED ORDER — TIZANIDINE HCL 4 MG PO CAPS: ORAL_CAPSULE | ORAL | 0 refills | Status: DC

## 2019-04-09 MED FILL — tiZANidine HCL 4 MG TABS: 4 | 30 days supply | Qty: 30 | Fill #0

## 2019-04-18 ENCOUNTER — Ambulatory Visit (INDEPENDENT_AMBULATORY_CARE_PROVIDER_SITE_OTHER): Payer: No Typology Code available for payment source | Admitting: Family Medicine

## 2019-04-18 ENCOUNTER — Other Ambulatory Visit: Payer: Self-pay

## 2019-04-18 ENCOUNTER — Encounter: Payer: Self-pay | Admitting: Family Medicine

## 2019-04-18 DIAGNOSIS — M542 Cervicalgia: Secondary | ICD-10-CM

## 2019-04-18 DIAGNOSIS — M999 Biomechanical lesion, unspecified: Secondary | ICD-10-CM

## 2019-04-18 NOTE — Patient Instructions (Signed)
Keep doing what you are doing See me again in 5 weeks

## 2019-04-18 NOTE — Assessment & Plan Note (Signed)
Decision today to treat with OMT was based on Physical Exam  After verbal consent patient was treated with HVLA, ME, FPR techniques in cervical, thoracic, lumbar and sacral areas  Patient tolerated the procedure well with improvement in symptoms  Patient given exercises, stretches and lifestyle modifications  See medications in patient instructions if given  Patient will follow up in 5-6 weeks 

## 2019-04-18 NOTE — Assessment & Plan Note (Signed)
Patient does have some neck pain.  We discussed posture and ergonomics [name] has been making some progress, not having any radicular symptoms and no side effects of the gabapentin.  Continue the same approach at this time with no changes in medications.  Follow-up again in 5 to 6 weeks

## 2019-04-18 NOTE — Progress Notes (Signed)
[name] [address] [place], [place] [address] Phone: [phone] Subjective:    I'm seeing this patient by the request  of:    CC: Neck pain follow-up  [id number]  [name] is a 43 y.o. female coming in with complaint of neck pain.  Patient was seen previously and had more of a cervicogenic headaches as well as tightness of the upper neck.  Patient states that she is making progress.  No side effects to medications or to any of the vitamin supplementation.  No radiation down the arms.    Past Medical History:  Diagnosis Date  . Anxiety   . Carpal tunnel syndrome   . Chronic lower back pain   . Chronic tension headaches   . Constipation   . Dysmenorrhea   . Endometrial polyp   . Fatigue   . GERD (gastroesophageal reflux disease)   . History of lumbar puncture 08/2016  . HPV in female   . Low serum vitamin D   . Neck pain, chronic   . Pelvic pain   . Thyroid nodule   . Tinnitus, bilateral   . TMJ (temporomandibular joint disorder)   . White matter abnormality on MRI of brain    Past Surgical History:  Procedure Laterality Date  . DE QUERVAIN'S RELEASE Bilateral   . DILATATION & CURETTAGE/HYSTEROSCOPY WITH MYOSURE N/A 10/24/2017   Procedure: DILATATION & CURETTAGE/HYSTEROSCOPY WITH MYOSURE;  Surgeon: Romualdo Bolk, MD;  Location: [facility];  Service: Gynecology;  Laterality: N/A;  . IUD REMOVAL N/A 10/24/2017   Procedure: INTRAUTERINE DEVICE (IUD) REMOVAL;  Surgeon: Romualdo Bolk, MD;  Location: [facility];  Service: Gynecology;  Laterality: N/A;  . TONSILLECTOMY    . WISDOM TOOTH EXTRACTION    . WRIST SURGERY Bilateral    2 ganglion cyst removed   Social History   Socioeconomic History  . Marital status: Divorced    Spouse name: Not on file  . Number of children: Not on file  . Years of education: Not on file  . Highest education level: Not on file  Occupational History  .  Not on file  Social Needs  . Financial resource strain: Not on file  . Food insecurity    Worry: Not on file    Inability: Not on file  . Transportation needs    Medical: Not on file    Non-medical: Not on file  Tobacco Use  . Smoking status: Former Smoker    Packs/day: 18.00    Years: 1.00    Pack years: 18.00    Quit date: 12/29/2012    Years since quitting: 6.3  . Smokeless tobacco: Never Used  Substance and Sexual Activity  . Alcohol use: Yes    Alcohol/week: 1.0 - 2.0 standard drinks    Types: 1 - 2 Standard drinks or equivalent per week  . Drug use: No  . Sexual activity: Yes    Partners: Male    Birth control/protection: Pill  Lifestyle  . Physical activity    Days per week: Not on file    Minutes per session: Not on file  . Stress: Not on file  Relationships  . Social Musician on phone: Not on file    Gets together: Not on file    Attends religious service: Not on file    Active member of club or organization: Not on file    Attends meetings of clubs  or organizations: Not on file    Relationship status: Not on file  Other Topics Concern  . Not on file  Social History Narrative  . Not on file   Allergies  Allergen Reactions  . Codeine Nausea Only  . 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  Current Outpatient Medications (Endocrine & Metabolic):  .  norethindrone-ethinyl estradiol (JUNEL FE,GILDESS FE,LOESTRIN FE) 1-20 MG-MCG tablet, TAKE 1 TABLET BY MOUTH ONCE DAILY .  predniSONE (DELTASONE) 5 MG tablet, 6-5-4-3-2-1-0  Current Outpatient Medications (Cardiovascular):  .  propranolol (INDERAL) 20 MG tablet, TAKE 1 TABLET BY MOUTH ONCE DAILY AS NEEDED  Current Outpatient Medications (Respiratory):  .  montelukast (SINGULAIR) 10 MG tablet, Take 1 tablet (10 mg total) by mouth at bedtime.  Current Outpatient Medications (Analgesics):  .  HYDROcodone-acetaminophen  (NORCO/VICODIN) 5-325 MG tablet, Take 1 tablet by mouth every 6 (six) hours as needed for moderate pain. .  Ibuprofen-Famotidine (DUEXIS) 800-26.6 MG TABS, Take 1 tablet by mouth 3 (three) times daily as needed.   Current Outpatient Medications (Other):  Marland Kitchen.  ALPRAZolam (XANAX) 0.5 MG tablet, TAKE 1 TABLET BY MOUTH ONCE DAILY AT BEDTIME AS NEEDED FOR ANXIETY .  amoxicillin (AMOXIL) 875 MG tablet, Take 1 tablet (875 mg total) by mouth 2 (two) times daily. .  baclofen (LIORESAL) 20 MG tablet, TAKE 1 TABLET BY MOUTH THREE TIMES DAILY .  esomeprazole (NEXIUM) 40 MG capsule, TAKE 1 CAPSULE BY MOUTH ONCE DAILY .  FLUoxetine (PROZAC) 20 MG capsule, Take 1 capsule (20 mg total) by mouth daily. Marland Kitchen.  gabapentin (NEURONTIN) 100 MG capsule, Take 2 capsules (200 mg total) by mouth at bedtime. .  Multiple Vitamin (MULTIVITAMIN WITH MINERALS) TABS, Take 1 tablet by mouth daily. Marland Kitchen.  tiZANidine (ZANAFLEX) 4 MG capsule, 1 tablet at night .  Topiramate ER (TROKENDI XR) 100 MG CP24, Take 100 mg by mouth daily.    Past medical history, social, surgical and family history all reviewed in electronic medical record.  No pertanent information unless stated regarding to the chief complaint.   Review of Systems:  No headache, visual changes, nausea, vomiting, diarrhea, constipation, dizziness, abdominal pain, skin rash, fevers, chills, night sweats, weight loss, swollen lymph nodes, body aches, joint swelling,  chest pain, shortness of breath, mood changes.  Positive muscle aches  Objective    General: No apparent distress alert and oriented x3 mood and affect normal, dressed appropriately.  HEENT: Pupils equal, extraocular movements intact  Respiratory: Patient's speak in full sentences and does not appear short of breath  Cardiovascular: No lower extremity edema, non tender, no erythema  Skin: Warm dry intact with no signs of infection or rash on extremities or on axial skeleton.  Abdomen: Soft nontender  Neuro:  Cranial nerves II through XII are intact, neurovascularly intact in all extremities with 2+ DTRs and 2+ pulses.  Lymph: No lymphadenopathy of posterior or anterior cervical chain or axillae bilaterally.  Gait normal with good balance and coordination.  MSK:  Non tender with full range of motion and good stability and symmetric strength and tone of shoulders, elbows, wrist, hip, knee and ankles bilaterally.  Neck exam still shows some tightness noted with loss of lordosis.  Patient does have some tightness in the parascapular region bilaterally.  Patient has negative Spurling's.  5/5 strength of the upper extremities bilaterally.  Osteopathic findings  C6 flexed rotated and side bent left T6 extended  rotated and side bent left L1 flexed rotated and side bent right Sacrum right on right     Impression and Recommendations:     This case required medical decision making of moderate complexity. The above documentation has been reviewed and is accurate and complete Lyndal Pulley, DO       Note: This dictation was prepared with Dragon dictation along with smaller phrase technology. Any transcriptional errors that result from this process are unintentional.

## 2019-04-23 ENCOUNTER — Other Ambulatory Visit: Payer: Self-pay | Admitting: Family Medicine

## 2019-04-23 MED FILL — BACLOFEN 20 MG TABLET: 20 | 10 days supply | Qty: 30 | Fill #0

## 2019-04-24 MED FILL — ALPRAZolam 0.5 MG TABS: 0.5 | 30 days supply | Qty: 30 | Fill #2

## 2019-05-05 ENCOUNTER — Encounter: Payer: Self-pay | Admitting: Family Medicine

## 2019-05-09 ENCOUNTER — Other Ambulatory Visit: Payer: Self-pay | Admitting: Family Medicine

## 2019-05-09 MED ORDER — BACLOFEN 20 MG PO TABS: 20.0000 mg | ORAL_TABLET | Freq: Three times a day (TID) | ORAL | 3 refills | Status: DC

## 2019-05-09 MED FILL — BACLOFEN 20 MG TABLET: 20 | 10 days supply | Qty: 30 | Fill #0

## 2019-05-15 ENCOUNTER — Other Ambulatory Visit: Payer: Self-pay | Admitting: Family Medicine

## 2019-05-15 MED ORDER — TIZANIDINE HCL 4 MG PO CAPS: ORAL_CAPSULE | ORAL | 0 refills | Status: DC

## 2019-05-15 MED FILL — tiZANidine HCL 4 MG TABS: 4 | 30 days supply | Qty: 30 | Fill #0

## 2019-05-16 ENCOUNTER — Ambulatory Visit (INDEPENDENT_AMBULATORY_CARE_PROVIDER_SITE_OTHER): Payer: No Typology Code available for payment source | Admitting: Family Medicine

## 2019-05-16 VITALS — BP 120/78 | HR 67 | Temp 98.6°F | Ht 63.0 in | Wt 170.8 lb

## 2019-05-16 DIAGNOSIS — E559 Vitamin D deficiency, unspecified: Secondary | ICD-10-CM | POA: Diagnosis not present

## 2019-05-16 DIAGNOSIS — Z0001 Encounter for general adult medical examination with abnormal findings: Secondary | ICD-10-CM | POA: Diagnosis not present

## 2019-05-16 DIAGNOSIS — F411 Generalized anxiety disorder: Secondary | ICD-10-CM | POA: Diagnosis not present

## 2019-05-16 DIAGNOSIS — F5101 Primary insomnia: Secondary | ICD-10-CM | POA: Diagnosis not present

## 2019-05-16 DIAGNOSIS — R5381 Other malaise: Secondary | ICD-10-CM | POA: Diagnosis not present

## 2019-05-16 DIAGNOSIS — R7301 Impaired fasting glucose: Secondary | ICD-10-CM | POA: Diagnosis not present

## 2019-05-16 DIAGNOSIS — Z13220 Encounter for screening for lipoid disorders: Secondary | ICD-10-CM

## 2019-05-16 DIAGNOSIS — M542 Cervicalgia: Secondary | ICD-10-CM

## 2019-05-16 DIAGNOSIS — Z0000 Encounter for general adult medical examination without abnormal findings: Secondary | ICD-10-CM

## 2019-05-16 DIAGNOSIS — R5383 Other fatigue: Secondary | ICD-10-CM

## 2019-05-16 MED ORDER — LORAZEPAM 1 MG PO TABS: 0.5000 mg | ORAL_TABLET | Freq: Every day | ORAL | 2 refills | Status: DC

## 2019-05-16 MED FILL — GABAPENTIN 100 MG CAPSULE: 100 | 90 days supply | Qty: 180 | Fill #1

## 2019-05-16 MED FILL — LORazepam 1 MG TABS: 1 | 90 days supply | Qty: 90 | Fill #0

## 2019-05-16 NOTE — Progress Notes (Signed)
Subjective:    [name] is a 43 y.o. female and is here for a comprehensive physical exam.  Health Maintenance Due  Topic Date Due  . HIV Screening  12/29/1989  . TETANUS/TDAP  12/29/1993  . INFLUENZA VACCINE  04/14/2019   Current Outpatient Medications:  .  baclofen (LIORESAL) 20 MG tablet, Take 1 tablet (20 mg total) by mouth 3 (three) times daily., Disp: 30 tablet, Rfl: 3 .  esomeprazole (NEXIUM) 40 MG capsule, TAKE 1 CAPSULE BY MOUTH ONCE DAILY, Disp: 90 capsule, Rfl: 0 .  FLUoxetine (PROZAC) 20 MG capsule, Take 1 capsule (20 mg total) by mouth daily., Disp: 90 capsule, Rfl: 3 .  gabapentin (NEURONTIN) 100 MG capsule, Take 2 capsules (200 mg total) by mouth at bedtime., Disp: 180 capsule, Rfl: 3 .  Ibuprofen-Famotidine (DUEXIS) 800-26.6 MG TABS, Take 1 tablet by mouth 3 (three) times daily as needed., Disp: 90 tablet, Rfl: 3 .  Multiple Vitamin (MULTIVITAMIN WITH MINERALS) TABS, Take 1 tablet by mouth daily., Disp: , Rfl:  .  norethindrone-ethinyl estradiol (JUNEL FE,GILDESS FE,LOESTRIN FE) 1-20 MG-MCG tablet, TAKE 1 TABLET BY MOUTH ONCE DAILY, Disp: 28 tablet, Rfl: 11 .  propranolol (INDERAL) 20 MG tablet, TAKE 1 TABLET BY MOUTH ONCE DAILY AS NEEDED, Disp: 90 tablet, Rfl: 0 .  Topiramate ER (TROKENDI XR) 100 MG CP24, Take 100 mg by mouth daily., Disp: 90 capsule, Rfl: 2  PMHx, SurgHx, SocialHx, Medications, and Allergies were reviewed in the Visit Navigator and updated as appropriate.   Past Medical History:  Diagnosis Date  . Anxiety   . Carpal tunnel syndrome   . Chronic lower back pain   . Chronic tension headaches   . Constipation   . Dysmenorrhea   . Endometrial polyp   . Fatigue   . GERD (gastroesophageal reflux disease)   . History of lumbar puncture 08/2016  . HPV in female   . Low serum vitamin D   . Neck pain, chronic   . Pelvic pain   . Thyroid nodule   . Tinnitus, bilateral   . TMJ (temporomandibular joint disorder)   . White matter abnormality on  MRI of brain      Past Surgical History:  Procedure Laterality Date  . DE QUERVAIN'S RELEASE Bilateral   . DILATATION & CURETTAGE/HYSTEROSCOPY WITH MYOSURE N/A 10/24/2017   Procedure: DILATATION & CURETTAGE/HYSTEROSCOPY WITH MYOSURE;  Surgeon: Salvadore Dom, MD;  Location: [place];  Service: Gynecology;  Laterality: N/A;  . IUD REMOVAL N/A 10/24/2017   Procedure: INTRAUTERINE DEVICE (IUD) REMOVAL;  Surgeon: Salvadore Dom, MD;  Location: [facility];  Service: Gynecology;  Laterality: N/A;  . TONSILLECTOMY    . WISDOM TOOTH EXTRACTION    . WRIST SURGERY Bilateral    2 ganglion cyst removed   Family History  Problem Relation Age of Onset  . ADD / ADHD Mother    Social History   Tobacco Use  . Smoking status: Former Smoker    Packs/day: 18.00    Years: 1.00    Pack years: 18.00    Quit date: 12/29/2012    Years since quitting: 6.3  . Smokeless tobacco: Never Used  Substance Use Topics  . Alcohol use: Yes    Alcohol/week: 1.0 - 2.0 standard drinks    Types: 1 - 2 Standard drinks or equivalent per week  . Drug use: No    Review of Systems:   Pertinent items are noted in the HPI. Otherwise,  ROS is negative.  Objective:   BP 120/78   Pulse 67   Temp 98.6 F (37 C) (Temporal)   Ht 5\' 3"  (1.6 m)   Wt 170 lb 12.8 oz (77.5 kg)   SpO2 98%   BMI 30.26 kg/m   General appearance: alert, cooperative and appears stated age. Head: normocephalic, without obvious abnormality, atraumatic. Neck: no adenopathy, supple, symmetrical, trachea midline; thyroid not enlarged, symmetric, no tenderness/mass/nodules. Lungs: clear to auscultation bilaterally. Heart: regular rate and rhythm Abdomen: soft, non-tender; no masses,  no organomegaly. Extremities: extremities normal, atraumatic, no cyanosis or edema. Skin: skin color, texture, turgor normal, no rashes or lesions. Lymph: cervical, supraclavicular, and axillary nodes normal; no abnormal  inguinal nodes palpated. Neurologic: grossly normal.                                      Assessment/Plan:   [name] was seen today for annual exam.  Diagnoses and all orders for this visit:  Routine physical examination  Neck pain Comments: OMT helping greatly.   Generalized anxiety disorder Comments: Using propranolol TID for anxiety, started around 2013.  Primary insomnia Comments: Waking throughout the night. Mind racing. Discussed medication options. Several have been tried already. Xanax helped her to fall asleep but not stay asleep. Endorses some hot flashes at night. Reviewed options: changed to Effexor, increase Gabapentin, HRT. Orders: -     LORazepam (ATIVAN) 1 MG tablet; Take 0.5-1 tablets (0.5-1 mg total) by mouth at bedtime.  Vitamin D deficiency -     VITAMIN D 25 Hydroxy (Vit-D Deficiency, Fractures)  Malaise and fatigue -     Vitamin B12 -     TSH -     T4, free -     Magnesium -     Comprehensive metabolic panel -     CBC with Differential/Platelet  Fasting hyperglycemia -     Hemoglobin A1c  Screening for lipid disorders -     Lipid panel    Patient Counseling: [x]    Nutrition: Stressed importance of moderation in sodium/caffeine intake, saturated fat and cholesterol, caloric balance, sufficient intake of fresh fruits, vegetables, fiber, calcium, iron, and 1 mg of folate supplement per day (for females capable of pregnancy).  [x]    Stressed the importance of regular exercise.   [x]    Substance Abuse: Discussed cessation/primary prevention of tobacco, alcohol, or other drug use; driving or other dangerous activities under the influence; availability of treatment for abuse.   [x]    Injury prevention: Discussed safety belts, safety helmets, smoke detector, smoking near bedding or upholstery.   [x]    Sexuality: Discussed sexually transmitted diseases, partner selection, use of condoms, avoidance of unintended pregnancy  and contraceptive alternatives.    [x]    Dental health: Discussed importance of regular tooth brushing, flossing, and dental visits.  [x]    Health maintenance and immunizations reviewed. Please refer to Health maintenance section.   Alejandra RimaErica Abriel Geesey, DO [facility]

## 2019-05-17 LAB — HEMOGLOBIN A1C: Hgb A1c MFr Bld: 5.6 % (ref 4.6–6.5)

## 2019-05-17 LAB — COMPREHENSIVE METABOLIC PANEL
ALT: 8 U/L (ref 0–35)
AST: 9 U/L (ref 0–37)
Albumin: 4.2 g/dL (ref 3.5–5.2)
Alkaline Phosphatase: 57 U/L (ref 39–117)
BUN: 19 mg/dL (ref 6–23)
CO2: 22 mEq/L (ref 19–32)
Calcium: 9 mg/dL (ref 8.4–10.5)
Chloride: 110 mEq/L (ref 96–112)
Creatinine, Ser: 0.58 mg/dL (ref 0.40–1.20)
GFR: 112.74 mL/min (ref >60.00)
Glucose, Bld: 92 mg/dL (ref 70–99)
Potassium: 4.2 mEq/L (ref 3.5–5.1)
Sodium: 140 mEq/L (ref 135–145)
Total Bilirubin: 0.2 mg/dL (ref 0.2–1.2)
Total Protein: 6.6 g/dL (ref 6.0–8.3)

## 2019-05-17 LAB — TSH: TSH: 2.88 u[IU]/mL (ref 0.35–4.50)

## 2019-05-17 LAB — CBC WITH DIFFERENTIAL/PLATELET
Basophils Absolute: 0.1 10*3/uL (ref 0.0–0.1)
Basophils Relative: 1.1 % (ref 0.0–3.0)
Eosinophils Absolute: 0.2 10*3/uL (ref 0.0–0.7)
Eosinophils Relative: 1.9 % (ref 0.0–5.0)
HCT: 39.9 % (ref 36.0–46.0)
Hemoglobin: 13 g/dL (ref 12.0–15.0)
Lymphocytes Relative: 31.7 % (ref 12.0–46.0)
Lymphs Abs: 3.3 10*3/uL (ref 0.7–4.0)
MCHC: 32.5 g/dL (ref 30.0–36.0)
MCV: 98.5 fl (ref 78.0–100.0)
Monocytes Absolute: 0.7 10*3/uL (ref 0.1–1.0)
Monocytes Relative: 7.1 % (ref 3.0–12.0)
Neutro Abs: 6.1 10*3/uL (ref 1.4–7.7)
Neutrophils Relative %: 58.2 % (ref 43.0–77.0)
Platelets: 202 10*3/uL (ref 150.0–400.0)
RBC: 4.05 Mil/uL (ref 3.87–5.11)
RDW: 12.7 % (ref 11.5–15.5)
WBC: 10.5 10*3/uL (ref 4.0–10.5)

## 2019-05-17 LAB — T4, FREE: Free T4: 1.07 ng/dL (ref 0.60–1.60)

## 2019-05-17 LAB — VITAMIN B12: Vitamin B-12: 248 pg/mL (ref 211–911)

## 2019-05-17 LAB — LIPID PANEL
Cholesterol: 188 mg/dL (ref 0–200)
HDL: 57.5 mg/dL (ref >39.00)
LDL Cholesterol: 99 mg/dL (ref 0–99)
NonHDL: 130.62
Total CHOL/HDL Ratio: 3
Triglycerides: 158 mg/dL — ABNORMAL HIGH (ref 0.0–149.0)
VLDL: 31.6 mg/dL (ref 0.0–40.0)

## 2019-05-17 LAB — VITAMIN D 25 HYDROXY (VIT D DEFICIENCY, FRACTURES): VITD: 26.78 ng/mL — ABNORMAL LOW (ref 30.00–100.00)

## 2019-05-17 LAB — MAGNESIUM: Magnesium: 1.9 mg/dL (ref 1.5–2.5)

## 2019-05-21 ENCOUNTER — Encounter: Payer: Self-pay | Admitting: Family Medicine

## 2019-05-21 DIAGNOSIS — F5101 Primary insomnia: Secondary | ICD-10-CM | POA: Insufficient documentation

## 2019-05-21 DIAGNOSIS — E559 Vitamin D deficiency, unspecified: Secondary | ICD-10-CM | POA: Insufficient documentation

## 2019-05-22 ENCOUNTER — Ambulatory Visit (INDEPENDENT_AMBULATORY_CARE_PROVIDER_SITE_OTHER): Payer: No Typology Code available for payment source

## 2019-05-22 DIAGNOSIS — E538 Deficiency of other specified B group vitamins: Secondary | ICD-10-CM

## 2019-05-22 MED ORDER — CYANOCOBALAMIN 1000 MCG/ML IJ SOLN
1000.0000 ug | Freq: Once | INTRAMUSCULAR | Status: AC
  Administered 2019-05-22: 1000 ug via INTRAMUSCULAR

## 2019-05-22 NOTE — Progress Notes (Signed)
Per orders of Dr. Wallace, injection of Vitamin b12, 1000 mcg given left deltoid IM by Abagale Boulos M Tysin Salada, CMA Patient tolerated injection well.  She will return in 1 week for her next injection.  

## 2019-05-23 ENCOUNTER — Ambulatory Visit: Payer: No Typology Code available for payment source | Admitting: Family Medicine

## 2019-05-23 ENCOUNTER — Other Ambulatory Visit: Payer: Self-pay

## 2019-05-23 ENCOUNTER — Telehealth: Payer: Self-pay

## 2019-05-23 MED ORDER — AMOXICILLIN-POT CLAVULANATE 875-125 MG PO TABS: 1.0000 | ORAL_TABLET | Freq: Two times a day (BID) | ORAL | 0 refills | Status: DC

## 2019-05-23 MED ORDER — HYDROCOD POLST-CPM POLST ER 10-8 MG/5ML PO SUER: 5.0000 mL | Freq: Every evening | ORAL | 0 refills | Status: DC | PRN

## 2019-05-23 NOTE — Telephone Encounter (Signed)
Please call in for patient not feeling well.

## 2019-05-29 ENCOUNTER — Ambulatory Visit (INDEPENDENT_AMBULATORY_CARE_PROVIDER_SITE_OTHER): Payer: No Typology Code available for payment source

## 2019-05-29 DIAGNOSIS — E538 Deficiency of other specified B group vitamins: Secondary | ICD-10-CM | POA: Diagnosis not present

## 2019-05-29 MED ORDER — CYANOCOBALAMIN 1000 MCG/ML IJ SOLN
1000.0000 ug | Freq: Once | INTRAMUSCULAR | Status: AC
  Administered 2019-05-29: 1000 ug via INTRAMUSCULAR

## 2019-05-29 MED FILL — BLISOVI FE 1/20 1-20 MG-MCG: 1-20 | 84 days supply | Qty: 84 | Fill #1

## 2019-05-29 MED FILL — BACLOFEN 20 MG TABLET: 20 | 10 days supply | Qty: 30 | Fill #1

## 2019-05-29 NOTE — Progress Notes (Signed)
Per orders of Dr. Wallace, injection of Vitamin b12, 1000 mcg given right deltoid IM by Jennifer M Zellmer, CMA  Patient tolerated injection well.  She will return in 1 week for her next injection  

## 2019-05-29 NOTE — Progress Notes (Deleted)
[name] [place], [name] [address] Phone: [phone] Subjective:    I'm seeing this patient by the request  of:    CC: Neck pain follow-up  [id number]  [name] is a 43 y.o. female coming in with complaint of ***  Onset-  Location Duration-  Character- Aggravating factors- Reliving factors-  Therapies tried-  Severity-     Past Medical History:  Diagnosis Date  . Anxiety   . Carpal tunnel syndrome   . Chronic lower back pain   . Chronic tension headaches   . Constipation   . Dysmenorrhea   . Endometrial polyp   . Fatigue   . GERD (gastroesophageal reflux disease)   . History of lumbar puncture 08/2016  . HPV in female   . Low serum vitamin D   . Neck pain, chronic   . Pelvic pain   . Thyroid nodule   . Tinnitus, bilateral   . TMJ (temporomandibular joint disorder)   . White matter abnormality on MRI of brain    Past Surgical History:  Procedure Laterality Date  . DE QUERVAIN'S RELEASE Bilateral   . DILATATION & CURETTAGE/HYSTEROSCOPY WITH MYOSURE N/A 10/24/2017   Procedure: DILATATION & CURETTAGE/HYSTEROSCOPY WITH MYOSURE;  Surgeon: Salvadore Dom, MD;  Location: [place];  Service: Gynecology;  Laterality: N/A;  . IUD REMOVAL N/A 10/24/2017   Procedure: INTRAUTERINE DEVICE (IUD) REMOVAL;  Surgeon: Salvadore Dom, MD;  Location: [facility];  Service: Gynecology;  Laterality: N/A;  . TONSILLECTOMY    . WISDOM TOOTH EXTRACTION    . WRIST SURGERY Bilateral    2 ganglion cyst removed   Social History   Socioeconomic History  . Marital status: Divorced    Spouse name: [name]  . Number of children: 1  . Years of education: Not on file  . Highest education level: Not on file  Occupational History    Employer: [employer] Needs  . Financial resource strain: Not on file  . Food insecurity    Worry: Not on file    Inability: Not on file  .  Transportation needs    Medical: Not on file    Non-medical: Not on file  Tobacco Use  . Smoking status: Former Smoker    Packs/day: 18.00    Years: 1.00    Pack years: 18.00    Quit date: 12/29/2012    Years since quitting: 6.4  . Smokeless tobacco: Never Used  Substance and Sexual Activity  . Alcohol use: Yes    Alcohol/week: 1.0 - 2.0 standard drinks    Types: 1 - 2 Standard drinks or equivalent per week  . Drug use: No  . Sexual activity: Yes    Partners: Male    Birth control/protection: Pill  Lifestyle  . Physical activity    Days per week: Not on file    Minutes per session: Not on file  . Stress: Not on file  Relationships  . Social Herbalist on phone: Not on file    Gets together: Not on file    Attends religious service: Not on file    Active member of club or organization: Not on file    Attends meetings of clubs or organizations: Not on file    Relationship status: Not on file  Other Topics Concern  . Not on file  Social History Narrative  . Not on file   Allergies  Allergen Reactions  . Codeine Nausea Only  . Other Other (See Comments)    Anxious - couldn't sit still (brompheniramine DM)   Family History  Problem Relation Age of Onset  . ADD / ADHD Mother     Current Outpatient Medications (Endocrine & Metabolic):  .  norethindrone-ethinyl estradiol (JUNEL FE,GILDESS FE,LOESTRIN FE) 1-20 MG-MCG tablet, TAKE 1 TABLET BY MOUTH ONCE DAILY  Current Outpatient Medications (Cardiovascular):  .  propranolol (INDERAL) 20 MG tablet, TAKE 1 TABLET BY MOUTH ONCE DAILY AS NEEDED  Current Outpatient Medications (Respiratory):  .  chlorpheniramine-HYDROcodone (TUSSIONEX PENNKINETIC ER) 10-8 MG/5ML SUER, Take 5 mLs by mouth at bedtime as needed for cough.  Current Outpatient Medications (Analgesics):  Marland Kitchen.  Ibuprofen-Famotidine (DUEXIS) 800-26.6 MG TABS, Take 1 tablet by mouth 3 (three) times daily as needed.   Current Outpatient Medications (Other):   .  amoxicillin-clavulanate (AUGMENTIN) 875-125 MG tablet, Take 1 tablet by mouth 2 (two) times daily. .  baclofen (LIORESAL) 20 MG tablet, Take 1 tablet (20 mg total) by mouth 3 (three) times daily. Marland Kitchen.  esomeprazole (NEXIUM) 40 MG capsule, TAKE 1 CAPSULE BY MOUTH ONCE DAILY .  FLUoxetine (PROZAC) 20 MG capsule, Take 1 capsule (20 mg total) by mouth daily. Marland Kitchen.  gabapentin (NEURONTIN) 100 MG capsule, Take 2 capsules (200 mg total) by mouth at bedtime. Marland Kitchen.  LORazepam (ATIVAN) 1 MG tablet, Take 0.5-1 tablets (0.5-1 mg total) by mouth at bedtime. .  Multiple Vitamin (MULTIVITAMIN WITH MINERALS) TABS, Take 1 tablet by mouth daily. .  Topiramate ER (TROKENDI XR) 100 MG CP24, Take 100 mg by mouth daily.    Past medical history, social, surgical and family history all reviewed in electronic medical record.  No pertanent information unless stated regarding to the chief complaint.   Review of Systems:  No headache, visual changes, nausea, vomiting, diarrhea, constipation, dizziness, abdominal pain, skin rash, fevers, chills, night sweats, weight loss, swollen lymph nodes, body aches, joint swelling, muscle aches, chest pain, shortness of breath, mood changes.   Objective  There were no vitals taken for this visit. Systems examined below as of    General: No apparent distress alert and oriented x3 mood and affect normal, dressed appropriately.  HEENT: Pupils equal, extraocular movements intact  Respiratory: Patient's speak in full sentences and does not appear short of breath  Cardiovascular: No lower extremity edema, non tender, no erythema  Skin: Warm dry intact with no signs of infection or rash on extremities or on axial skeleton.  Abdomen: Soft nontender  Neuro: Cranial nerves II through XII are intact, neurovascularly intact in all extremities with 2+ DTRs and 2+ pulses.  Lymph: No lymphadenopathy of posterior or anterior cervical chain or axillae bilaterally.  Gait normal with good balance and  coordination.  MSK:  Non tender with full range of motion and good stability and symmetric strength and tone of shoulders, elbows, wrist, hip, knee and ankles bilaterally.  Neck: Inspection unremarkable. No palpable stepoffs. Negative Spurling's maneuver. Full neck range of motion Grip strength and sensation normal in bilateral hands Strength good C4 to T1 distribution No sensory change to C4 to T1 Negative Hoffman sign bilaterally Reflexes normal Tightness of the trapezius bilaterally  Osteopathic findings  C2 flexed rotated and side bent right C4 flexed rotated and side bent left T9 extended rotated and side bent left L2 flexed rotated and side bent right Sacrum right on right    Impression and Recommendations:     This case required medical decision making  of moderate complexity. The above documentation has been reviewed and is accurate and complete Lyndal Pulley, DO       Note: This dictation was prepared with Dragon dictation along with smaller phrase technology. Any transcriptional errors that result from this process are unintentional.

## 2019-05-30 ENCOUNTER — Ambulatory Visit: Payer: No Typology Code available for payment source | Admitting: Family Medicine

## 2019-05-30 ENCOUNTER — Encounter: Payer: Self-pay | Admitting: Family Medicine

## 2019-05-30 NOTE — Assessment & Plan Note (Deleted)
Stable.  Discussed posture and ergonomics, discussed which activities of doing which wants to avoid.  Patient is to increase activity slowly.  Follow-up again in 4 to 8 weeks

## 2019-05-30 NOTE — Assessment & Plan Note (Deleted)
Decision today to treat with OMT was based on Physical Exam  After verbal consent patient was treated with HVLA, ME, FPR techniques in cervical, thoracic, lumbar and sacral areas  Patient tolerated the procedure well with improvement in symptoms  Patient given exercises, stretches and lifestyle modifications  See medications in patient instructions if given  Patient will follow up in 4-8 weeks 

## 2019-06-01 ENCOUNTER — Other Ambulatory Visit: Payer: Self-pay | Admitting: Family Medicine

## 2019-06-01 MED ORDER — TIZANIDINE HCL 4 MG PO CAPS: ORAL_CAPSULE | ORAL | 0 refills | Status: DC

## 2019-06-01 NOTE — Telephone Encounter (Signed)
Refill done.  

## 2019-06-04 ENCOUNTER — Other Ambulatory Visit: Payer: Self-pay | Admitting: Family Medicine

## 2019-06-04 ENCOUNTER — Other Ambulatory Visit: Payer: Self-pay

## 2019-06-04 ENCOUNTER — Ambulatory Visit (INDEPENDENT_AMBULATORY_CARE_PROVIDER_SITE_OTHER): Payer: No Typology Code available for payment source | Admitting: Family Medicine

## 2019-06-04 DIAGNOSIS — M25511 Pain in right shoulder: Secondary | ICD-10-CM | POA: Diagnosis not present

## 2019-06-04 MED ORDER — HYDROCODONE-ACETAMINOPHEN 5-325 MG PO TABS: 1.0000 | ORAL_TABLET | Freq: Four times a day (QID) | ORAL | 0 refills | Status: DC | PRN

## 2019-06-04 MED ORDER — METHYLPREDNISOLONE ACETATE 80 MG/ML IJ SUSP
80.0000 mg | Freq: Once | INTRAMUSCULAR | Status: AC
  Administered 2019-06-04: 80 mg via INTRAMUSCULAR

## 2019-06-04 MED FILL — HYDROCODON-APAP 5-325: 5-325 | 7 days supply | Qty: 30 | Fill #0

## 2019-06-04 NOTE — Progress Notes (Signed)
   Chief Complaint:  DIARRA CEJA is a 43 y.o. female who presents today with a chief complaint of shoulder pain.   Assessment/Plan:  Shoulder Pain Possibly rotator cuff tendinopathy.  Injection performed today.  See below procedure note.  Tolerated well.  Discussed possibility of steroid flare.  She can continue using over-the-counter NSAIDs as needed.  She will follow-up with PCP or sports medicine if not improving.     Subjective:  HPI:  Right shoulder pain Started a few days ago.  Worsened over the last day.  Has decreased range of motion above shoulder level.  No obvious injuries or precipitating events.  ROS: Per HPI  PMH: She reports that she quit smoking about 6 years ago. She has a 18.00 pack-year smoking history. She has never used smokeless tobacco. She reports current alcohol use of about 1.0 - 2.0 standard drinks of alcohol per week. She reports that she does not use drugs.      Objective:  Physical Exam: There were no vitals taken for this visit.  Gen: NAD, resting comfortably MSK:  -Right shoulder: Limited range of motion above shoulder due to pain.  Able to fully extend area above head passively.  Normal strength with internal and external rotation.  Resisted external rotation produced a small amount of pain.  Supraspinatus testing intact however elicits pain on the right.  Shoulder Injection Procedure Note  Pre-operative Diagnosis: Rotator cuff tendinopathy  Post-operative Diagnosis: same  Indications: Pain Relief   Anesthesia: Topical ethyl chloride  Procedure Details   Written consent was obtained for the procedure. The shoulder was prepped with iodine and the skin was anesthetized with topical ethyl choride. Using a 25 gauge needle the subacromial space was injected with 3 mL 1% lidocaine and 1 mL of 80cc/ml depomedrol under the posterior aspect of the acromion. The injection site was cleansed with topical isopropyl alcohol and a dressing was applied.   Complications:  None; patient tolerated the procedure well.      Algis Greenhouse. Jerline Pain, MD 06/04/2019 3:37 PM

## 2019-06-06 MED FILL — TROKENDI XR 100 MG CAPSULE: 100 | 90 days supply | Qty: 90 | Fill #0

## 2019-06-07 ENCOUNTER — Telehealth: Payer: Self-pay | Admitting: Physician Assistant

## 2019-06-07 DIAGNOSIS — R112 Nausea with vomiting, unspecified: Secondary | ICD-10-CM

## 2019-06-07 MED ORDER — PROMETHAZINE HCL 25 MG PO TABS: 25.0000 mg | ORAL_TABLET | Freq: Two times a day (BID) | ORAL | 0 refills | Status: DC | PRN

## 2019-06-07 NOTE — Progress Notes (Signed)
We are sorry that you are not feeling well. Here is how we plan to help!  Based on what you have shared with me it looks like you have a Virus that is irritating your GI tract.  Vomiting is the forceful emptying of a portion of the stomach's content through the mouth.  Although nausea and vomiting can make you feel miserable, it's important to remember that these are not diseases, but rather symptoms of an underlying illness.  When we treat short term symptoms, we always caution that any symptoms that persist should be fully evaluated in a medical office.  I have prescribed a medication that will help alleviate your symptoms and allow you to stay hydrated:  Promethazine 25 mg take 1 tablet twice daily  HOME CARE:  Drink clear liquids.  This is very important! Dehydration (the lack of fluid) can lead to a serious complication.  Start off with 1 tablespoon every 5 minutes for 8 hours.  You may begin eating bland foods after 8 hours without vomiting.  Start with saltine crackers, white bread, rice, mashed potatoes, applesauce.  After 48 hours on a bland diet, you may resume a normal diet.  Try to go to sleep.  Sleep often empties the stomach and relieves the need to vomit.  GET HELP RIGHT AWAY IF:   If you have any abdominal pain then you will need to seek face to face evaluation immediately.   Your symptoms do not improve or worsen within 2 days after treatment.  You have a fever for over 3 days.  You cannot keep down fluids after trying the medication.  MAKE SURE YOU:   Understand these instructions.  Will watch your condition.  Will get help right away if you are not doing well or get worse.   Thank you for choosing an e-visit. Your e-visit answers were reviewed by a board certified advanced clinical practitioner to complete your personal care plan. Depending upon the condition, your plan could have included both over the counter or prescription medications. Please review  your pharmacy choice. Be sure that the pharmacy you have chosen is open so that you can pick up your prescription now.  If there is a problem you may message your provider in [place] to have the prescription routed to another pharmacy. Your safety is important to [place]. If you have drug allergies check your prescription carefully.  For the next 24 hours, you can use MyChart to ask questions about today's visit, request a non-urgent call back, or ask for a work or school excuse from your e-visit provider. You will get an e-mail in the next two days asking about your experience. I hope that your e-visit has been valuable and will speed your recovery.  Approximately 5 minutes was spent documenting and reviewing patient's chart.

## 2019-06-11 ENCOUNTER — Encounter: Payer: Self-pay | Admitting: Family Medicine

## 2019-06-11 MED FILL — BACLOFEN 20 MG TABLET: 20 | 10 days supply | Qty: 30 | Fill #2

## 2019-06-12 ENCOUNTER — Other Ambulatory Visit: Payer: Self-pay | Admitting: Family Medicine

## 2019-06-12 MED ORDER — BUPROPION HCL ER (XL) 150 MG PO TB24: 150.0000 mg | ORAL_TABLET | Freq: Every day | ORAL | 3 refills | Status: DC

## 2019-06-13 ENCOUNTER — Ambulatory Visit (INDEPENDENT_AMBULATORY_CARE_PROVIDER_SITE_OTHER): Payer: No Typology Code available for payment source | Admitting: Family Medicine

## 2019-06-13 ENCOUNTER — Encounter: Payer: Self-pay | Admitting: Family Medicine

## 2019-06-13 ENCOUNTER — Other Ambulatory Visit: Payer: Self-pay

## 2019-06-13 VITALS — BP 138/84 | HR 67 | Ht 63.0 in | Wt 170.0 lb

## 2019-06-13 DIAGNOSIS — M999 Biomechanical lesion, unspecified: Secondary | ICD-10-CM | POA: Diagnosis not present

## 2019-06-13 DIAGNOSIS — M542 Cervicalgia: Secondary | ICD-10-CM | POA: Diagnosis not present

## 2019-06-13 MED ORDER — VITAMIN D (ERGOCALCIFEROL) 1.25 MG (50000 UNIT) PO CAPS: 50000.0000 [IU] | ORAL_CAPSULE | ORAL | 0 refills | Status: DC

## 2019-06-13 MED FILL — VIT D2 1.25 MG (50,000 UNIT: 1.25 MG | 84 days supply | Qty: 12 | Fill #0

## 2019-06-13 NOTE — Progress Notes (Signed)
[name] [place], [place] [address] Phone: [phone] Subjective:    I'm seeing this patient by the request  of:    CC: Neck and shoulder pain  [id number]  [name] is a 43 y.o. female coming in with complaint of neck and right shoulder pain. 10 days ago she received an injection that has not helped. Certain ROM is painful. Shoulder pain that radiates down the anterior portion of the upper arm. Doesn't recall any injury.   Onset- 3 weeks  Location -  Duration-  Character- Aggravating factors- overhead, reaching  Reliving factors-  Therapies tried- ice, tizanidine, duexis, gabapentin  Severity-5 out of 10     Past Medical History:  Diagnosis Date  . Anxiety   . Carpal tunnel syndrome   . Chronic lower back pain   . Chronic tension headaches   . Constipation   . Dysmenorrhea   . Endometrial polyp   . Fatigue   . GERD (gastroesophageal reflux disease)   . History of lumbar puncture 08/2016  . HPV in female   . Low serum vitamin D   . Neck pain, chronic   . Pelvic pain   . Thyroid nodule   . Tinnitus, bilateral   . TMJ (temporomandibular joint disorder)   . White matter abnormality on MRI of brain    Past Surgical History:  Procedure Laterality Date  . DE QUERVAIN'S RELEASE Bilateral   . DILATATION & CURETTAGE/HYSTEROSCOPY WITH MYOSURE N/A 10/24/2017   Procedure: DILATATION & CURETTAGE/HYSTEROSCOPY WITH MYOSURE;  Surgeon: Salvadore Dom, MD;  Location: [facility];  Service: Gynecology;  Laterality: N/A;  . IUD REMOVAL N/A 10/24/2017   Procedure: INTRAUTERINE DEVICE (IUD) REMOVAL;  Surgeon: Salvadore Dom, MD;  Location: [facility];  Service: Gynecology;  Laterality: N/A;  . TONSILLECTOMY    . WISDOM TOOTH EXTRACTION    . WRIST SURGERY Bilateral    2 ganglion cyst removed   Social History   Socioeconomic History  . Marital status: Divorced    Spouse name: Will  .  Number of children: 1  . Years of education: Not on file  . Highest education level: Not on file  Occupational History    Employer: [employer]  . Financial resource strain: Not on file  . Food insecurity    Worry: Not on file    Inability: Not on file  . Transportation needs    Medical: Not on file    Non-medical: Not on file  Tobacco Use  . Smoking status: Former Smoker    Packs/day: 18.00    Years: 1.00    Pack years: 18.00    Quit date: 12/29/2012    Years since quitting: 6.4  . Smokeless tobacco: Never Used  Substance and Sexual Activity  . Alcohol use: Yes    Alcohol/week: 1.0 - 2.0 standard drinks    Types: 1 - 2 Standard drinks or equivalent per week  . Drug use: No  . Sexual activity: Yes    Partners: Male    Birth control/protection: Pill  Lifestyle  . Physical activity    Days per week: Not on file    Minutes per session: Not on file  . Stress: Not on file  Relationships  . Social Herbalist on phone: Not on file    Gets together: Not on file    Attends religious service: Not on file  Active member of club or organization: Not on file    Attends meetings of clubs or organizations: Not on file    Relationship status: Not on file  Other Topics Concern  . Not on file  Social History Narrative  . Not on file   Allergies  Allergen Reactions  . Codeine Nausea Only  . Other Other (See Comments)    Anxious - couldn't sit still (brompheniramine DM)   Family History  Problem Relation Age of Onset  . ADD / ADHD Mother     Current Outpatient Medications (Endocrine & Metabolic):  .  norethindrone-ethinyl estradiol (JUNEL FE,GILDESS FE,LOESTRIN FE) 1-20 MG-MCG tablet, TAKE 1 TABLET BY MOUTH ONCE DAILY  Current Outpatient Medications (Cardiovascular):  .  propranolol (INDERAL) 20 MG tablet, TAKE 1 TABLET BY MOUTH ONCE DAILY AS NEEDED   Current Outpatient Medications (Analgesics):  Marland Kitchen  Ibuprofen-Famotidine (DUEXIS) 800-26.6 MG TABS,  Take 1 tablet by mouth 3 (three) times daily as needed.   Current Outpatient Medications (Other):  Marland Kitchen  buPROPion (WELLBUTRIN XL) 150 MG 24 hr tablet, Take 1 tablet (150 mg total) by mouth daily. Marland Kitchen  esomeprazole (NEXIUM) 40 MG capsule, TAKE 1 CAPSULE BY MOUTH ONCE DAILY .  FLUoxetine (PROZAC) 20 MG capsule, Take 1 capsule (20 mg total) by mouth daily. Marland Kitchen  gabapentin (NEURONTIN) 100 MG capsule, Take 2 capsules (200 mg total) by mouth at bedtime. Marland Kitchen  LORazepam (ATIVAN) 1 MG tablet, Take 0.5-1 tablets (0.5-1 mg total) by mouth at bedtime. .  Multiple Vitamin (MULTIVITAMIN WITH MINERALS) TABS, Take 1 tablet by mouth daily. Marland Kitchen  tiZANidine (ZANAFLEX) 4 MG capsule, 1 tablet at night .  TROKENDI XR 100 MG CP24, TAKE 1 CAPSULE BY MOUTH ONCE A DAY .  Vitamin D, Ergocalciferol, (DRISDOL) 1.25 MG (50000 UT) CAPS capsule, Take 1 capsule (50,000 Units total) by mouth every 7 (seven) days.    Past medical history, social, surgical and family history all reviewed in electronic medical record.  No pertanent information unless stated regarding to the chief complaint.   Review of Systems:  No headache, visual changes, nausea, vomiting, diarrhea, constipation, dizziness, abdominal pain, skin rash, fevers, chills, night sweats, weight loss, swollen lymph nodes, body aches, joint swelling, muscle aches, chest pain, shortness of breath, mood changes.   Objective  Blood pressure 138/84, pulse 67, height 5\' 3"  (1.6 m), weight 170 lb (77.1 kg), SpO2 98 %. Systems examined below as of    General: No apparent distress alert and oriented x3 mood and affect normal, dressed appropriately.  HEENT: Pupils equal, extraocular movements intact  Respiratory: Patient's speak in full sentences and does not appear short of breath  Cardiovascular: No lower extremity edema, non tender, no erythema  Skin: Warm dry intact with no signs of infection or rash on extremities or on axial skeleton.  Abdomen: Soft nontender  Neuro: Cranial  nerves II through XII are intact, neurovascularly intact in all extremities with 2+ DTRs and 2+ pulses.  Lymph: No lymphadenopathy of posterior or anterior cervical chain or axillae bilaterally.  Gait normal with good balance and coordination.  MSK:  Non tender with full range of motion and good stability and symmetric strength and tone of shoulders, elbows, wrist, hip, knee and ankles bilaterally.  Neck exam shows some mild loss of lordosis.  Tightness noted in the cervical thoracic area right greater than left.  Tightness of the trapezius noted.  Osteopathic findings  C2 flexed rotated and side bent right T5 extended rotated and side bent  right  L2 flexed rotated and side bent right Sacrum right on right    Impression and Recommendations:     This case required medical decision making of moderate complexity. The above documentation has been reviewed and is accurate and complete Judi Saa, DO       Note: This dictation was prepared with Dragon dictation along with smaller phrase technology. Any transcriptional errors that result from this process are unintentional.

## 2019-06-13 NOTE — Patient Instructions (Signed)
Exercises 3x a week Once weekly vit D See me again in 6-7 weeks for OMT

## 2019-06-13 NOTE — Assessment & Plan Note (Signed)
We discussed posture and ergonomics, which activities of doing which will still avoid.  Patient should increase activity as tolerated.  Follow-up again in 4 to 8 weeks

## 2019-06-13 NOTE — Assessment & Plan Note (Signed)
Decision today to treat with OMT was based on Physical Exam  After verbal consent patient was treated with HVLA, ME, FPR techniques in  thoracic, lumbar and sacral areas  Patient tolerated the procedure well with improvement in symptoms  Patient given exercises, stretches and lifestyle modifications  See medications in patient instructions if given  Patient will follow up in 4-8 weeks 

## 2019-06-21 MED FILL — BACLOFEN 20 MG TABLET: 20 | 10 days supply | Qty: 30 | Fill #3

## 2019-06-21 MED FILL — tiZANidine HCL 4 MG TABS: 4 | 30 days supply | Qty: 30 | Fill #0

## 2019-06-25 ENCOUNTER — Other Ambulatory Visit: Payer: Self-pay

## 2019-06-25 ENCOUNTER — Other Ambulatory Visit: Payer: Self-pay | Admitting: Family Medicine

## 2019-06-25 DIAGNOSIS — M25511 Pain in right shoulder: Secondary | ICD-10-CM

## 2019-06-25 DIAGNOSIS — F5101 Primary insomnia: Secondary | ICD-10-CM

## 2019-06-25 DIAGNOSIS — K219 Gastro-esophageal reflux disease without esophagitis: Secondary | ICD-10-CM

## 2019-06-25 MED ORDER — FLUOXETINE HCL 20 MG PO CAPS: 20.0000 mg | ORAL_CAPSULE | Freq: Every day | ORAL | 3 refills | Status: DC

## 2019-06-25 MED ORDER — ESOMEPRAZOLE MAGNESIUM 40 MG PO CPDR: 40.0000 mg | DELAYED_RELEASE_CAPSULE | Freq: Every day | ORAL | 0 refills | Status: DC

## 2019-06-25 MED ORDER — OXYCODONE-ACETAMINOPHEN 5-325 MG PO TABS: 1.0000 | ORAL_TABLET | ORAL | 0 refills | Status: DC | PRN

## 2019-06-25 MED ORDER — TIZANIDINE HCL 4 MG PO CAPS: ORAL_CAPSULE | ORAL | 0 refills | Status: DC

## 2019-06-25 MED ORDER — GABAPENTIN 100 MG PO CAPS: 200.0000 mg | ORAL_CAPSULE | Freq: Every day | ORAL | 3 refills | Status: DC

## 2019-06-25 MED ORDER — PROPRANOLOL HCL 20 MG PO TABS: 20.0000 mg | ORAL_TABLET | Freq: Every day | ORAL | 0 refills | Status: DC | PRN

## 2019-06-25 MED ORDER — DUEXIS 800-26.6 MG PO TABS: 1.0000 | ORAL_TABLET | Freq: Three times a day (TID) | ORAL | 3 refills | Status: DC | PRN

## 2019-06-25 MED ORDER — TROKENDI XR 100 MG PO CP24: 1.0000 | ORAL_CAPSULE | Freq: Every day | ORAL | 2 refills | Status: DC

## 2019-06-25 MED ORDER — LORAZEPAM 1 MG PO TABS: 0.5000 mg | ORAL_TABLET | Freq: Every day | ORAL | 2 refills | Status: DC

## 2019-06-25 MED ORDER — NORETHIN ACE-ETH ESTRAD-FE 1-20 MG-MCG PO TABS: 1.0000 | ORAL_TABLET | Freq: Every day | ORAL | 11 refills | Status: DC

## 2019-06-25 MED ORDER — BUPROPION HCL ER (XL) 150 MG PO TB24: 150.0000 mg | ORAL_TABLET | Freq: Every day | ORAL | 3 refills | Status: DC

## 2019-06-25 MED FILL — OXYCODONE-ACETAMINOPHEN 5-3: 5-325 | 4 days supply | Qty: 45 | Fill #0

## 2019-06-25 MED FILL — ESOMEPRAZOLE MAG DR 40 MG C: 40 | 90 days supply | Qty: 90 | Fill #0

## 2019-06-25 MED FILL — PROPRANOLOL 20 MG TABLET: 20 | 90 days supply | Qty: 90 | Fill #0

## 2019-06-25 MED FILL — FLUoxetine HCL 20 MG CAPS: 20 | 90 days supply | Qty: 90 | Fill #0

## 2019-07-03 ENCOUNTER — Other Ambulatory Visit: Payer: Self-pay | Admitting: Family Medicine

## 2019-07-03 MED FILL — BACLOFEN 20 MG TABLET: 20 | 30 days supply | Qty: 90 | Fill #0

## 2019-07-04 DIAGNOSIS — M7501 Adhesive capsulitis of right shoulder: Secondary | ICD-10-CM | POA: Insufficient documentation

## 2019-07-06 ENCOUNTER — Other Ambulatory Visit: Payer: Self-pay | Admitting: Family Medicine

## 2019-07-06 MED ORDER — OXYCODONE-ACETAMINOPHEN 5-325 MG PO TABS: 1.0000 | ORAL_TABLET | ORAL | 0 refills | Status: DC | PRN

## 2019-07-06 MED FILL — OXYCODONE-ACETAMINOPHEN 5-3: 5-325 | 4 days supply | Qty: 45 | Fill #0

## 2019-07-10 ENCOUNTER — Ambulatory Visit: Payer: No Typology Code available for payment source | Admitting: Physical Therapy

## 2019-07-12 ENCOUNTER — Ambulatory Visit: Payer: No Typology Code available for payment source | Admitting: Physical Therapy

## 2019-07-12 ENCOUNTER — Other Ambulatory Visit: Payer: Self-pay

## 2019-07-12 ENCOUNTER — Encounter: Payer: Self-pay | Admitting: Physical Therapy

## 2019-07-12 DIAGNOSIS — M25611 Stiffness of right shoulder, not elsewhere classified: Secondary | ICD-10-CM | POA: Diagnosis not present

## 2019-07-12 DIAGNOSIS — M25511 Pain in right shoulder: Secondary | ICD-10-CM

## 2019-07-12 DIAGNOSIS — M6281 Muscle weakness (generalized): Secondary | ICD-10-CM | POA: Diagnosis not present

## 2019-07-12 NOTE — Patient Instructions (Signed)
Access Code: [email or web address]  URL: [email or web address]  Date: 07/12/2019  Prepared by: Lyndee Hensen   Exercises Supine Shoulder Flexion AAROM with Hands Clasped - 10 reps - 1 sets - 3x daily Supine Shoulder Flexion with Dowel - 10 reps - 1 sets - 5 hold - 3x daily Supine Shoulder External Rotation in 45 Degrees Abduction AAROM with Dowel - 10 reps - 1 sets - 3x daily Seated Scapular Retraction - 10 reps - 1 sets - 2x daily Standing Shoulder Internal Rotation Stretch with Hands Behind Back - 10 reps - 1 sets - 2x daily Seated Shoulder Flexion AAROM with Pulley Behind - 10 reps - 2 sets - 2x daily Standing Shoulder Flexion Wall Walk - 5-10 reps - 1 sets - 2x daily                           Seated Shoulder Flexion Towel Slide at Table Top - 10 reps - 1 sets - 2x daily

## 2019-07-12 NOTE — Therapy (Signed)
[facility] [address] [place], [place], [address] Phone: [phone]   Fax:  [phone]  Physical Therapy Evaluation  Patient Details  Name: [name] MRN: [id number] Date of Birth: November 22, 1975 Referring Provider (PT): Supple   Encounter Date: 07/12/2019  PT End of Session - 07/12/19 1314    Visit Number  1    Number of Visits  12    Date for PT Re-Evaluation  08/23/19    Authorization Type  [facility]    PT Start Time  1315    PT Stop Time  1350    PT Time Calculation (min)  35 min    Activity Tolerance  Patient tolerated treatment well    Behavior During Therapy  [facility] for tasks assessed/performed       Past Medical History:  Diagnosis Date  . Anxiety   . Carpal tunnel syndrome   . Chronic lower back pain   . Chronic tension headaches   . Constipation   . Dysmenorrhea   . Endometrial polyp   . Fatigue   . GERD (gastroesophageal reflux disease)   . History of lumbar puncture 08/2016  . HPV in female   . Low serum vitamin D   . Neck pain, chronic   . Pelvic pain   . Thyroid nodule   . Tinnitus, bilateral   . TMJ (temporomandibular joint disorder)   . White matter abnormality on MRI of brain     Past Surgical History:  Procedure Laterality Date  . DE QUERVAIN'S RELEASE Bilateral   . DILATATION & CURETTAGE/HYSTEROSCOPY WITH MYOSURE N/A 10/24/2017   Procedure: DILATATION & CURETTAGE/HYSTEROSCOPY WITH MYOSURE;  Surgeon: Salvadore Dom, MD;  Location: [facility];  Service: Gynecology;  Laterality: N/A;  . IUD REMOVAL N/A 10/24/2017   Procedure: INTRAUTERINE DEVICE (IUD) REMOVAL;  Surgeon: Salvadore Dom, MD;  Location: [facility];  Service: Gynecology;  Laterality: N/A;  . TONSILLECTOMY    . WISDOM TOOTH EXTRACTION    . WRIST SURGERY Bilateral    2 ganglion cyst removed    There were no vitals filed for this visit.   Subjective Assessment - 07/12/19 1312     Subjective  Pt had onset of pain about 2 weeks ago,no incident to report. Saw Ortho and got injection 1 week ago. Prior to injection pt states significant pain in shoulder, with most all movements, reaching, lifting, cross arm motions and behind back motions. No previous shoulder injuries. Works as [employer] L handed.    Limitations  Lifting;Writing;House hold activities    Patient Stated Goals  Decreased pain, increased ROM and function of UE.    Currently in Pain?  Yes    Pain Score  4     Pain Location  Shoulder    Pain Orientation  Right    Pain Descriptors / Indicators  Aching;Burning    Pain Type  Acute pain    Pain Onset  1 to 4 weeks ago    Pain Frequency  Intermittent    Aggravating Factors   movement, reaching, lifting, work, Lexicographer.    Pain Relieving Factors  rest, heat ,, meds         [facility] PT Assessment - 07/12/19 0001      Assessment   Medical Diagnosis  R shoulder     Referring Provider (PT)  Supple    Hand Dominance  Left    Prior Therapy  no      Balance  Screen   Has the patient fallen in the past 6 months  No      Prior Function   Level of Independence  Independent      Cognition   Overall Cognitive Status  Within Functional Limits for tasks assessed      ROM / Strength   AROM / PROM / Strength  AROM;Strength;PROM      AROM   AROM Assessment Site  Shoulder    Right/Left Shoulder  Right    Right Shoulder Flexion  145 Degrees    Right Shoulder ABduction  90 Degrees    Right Shoulder Internal Rotation  65 Degrees    Right Shoulder External Rotation  70 Degrees      PROM   PROM Assessment Site  Shoulder    Right/Left Shoulder  Right    Right Shoulder Flexion  155 Degrees    Right Shoulder ABduction  150 Degrees    Right Shoulder Internal Rotation  65 Degrees    Right Shoulder External Rotation  80 Degrees      Strength   Strength Assessment Site  Shoulder    Right/Left Shoulder  Right    Right Shoulder Flexion  3+/5    Right Shoulder ABduction  3-/5     Right Shoulder Internal Rotation  4-/5    Right Shoulder External Rotation  4-/5      Palpation   Palpation comment  Mild GHJ stiffness, soreness at end range                Objective measurements completed on examination: See above findings.      [facility] Adult PT Treatment/Exercise - 07/12/19 0001      Exercises   Exercises  Shoulder      Shoulder Exercises: Supine   External Rotation  AAROM;10 reps    External Rotation Limitations  cane    Internal Rotation  AAROM;10 reps    Internal Rotation Limitations  cane    Flexion  AAROM;10 reps    Flexion Limitations  cane      Shoulder Exercises: Standing   Internal Rotation  10 reps;Both    Internal Rotation Limitations  2 hands behind back;     Other Standing Exercises  Scap squeeze x10;     Other Standing Exercises  Wall walk x5;       Shoulder Exercises: Pulleys   Flexion  2 minutes    Scaption  1 minute      Manual Therapy   Manual Therapy  Joint mobilization;Passive ROM    Joint Mobilization  Inf and post GHJ mobs    Passive ROM  For R shoulder, all motions.              PT Education - 07/12/19 1314    Education Details  PT POC, HEP, Exam findings.    Person(s) Educated  Patient    Methods  Explanation;Demonstration;Handout;Verbal cues    Comprehension  Verbalized understanding;Returned demonstration;Verbal cues required;Need further instruction       PT Short Term Goals - 07/12/19 1315      PT SHORT TERM GOAL #1   Title  Pt to be independent with initial HEP    Time  2    Period  Weeks    Status  New    Target Date  07/26/19        PT Long Term Goals - 07/12/19 1431      PT LONG TERM GOAL #1   Title  Pt to be independent with final HEP    Time  6    Period  Weeks    Status  New    Target Date  08/23/19      PT LONG TERM GOAL #2   Title  Pt to demo full AROM with R shoulder, to improve ability for ADLS.    Time  6    Period  Weeks    Status  New    Target Date  08/23/19       PT LONG TERM GOAL #3   Title  Pt to demo increased strength of R shoulder, to at least 4+/5, to improve ability for reaching, lifting, carrying, IADLS. and work duties    Time  6    Period  Weeks    Status  New    Target Date  08/23/19      PT LONG TERM GOAL #4   Title  Pt to report pain 0-2/10 with elevation and activity with R UE.    Time  6    Period  Weeks    Status  New    Target Date  08/23/19             Plan - 07/12/19 1433    Clinical Impression Statement  Pt presents with primary complaint of increased pain and stiffness in R shoulder. Pt with some improvements in pain and ROM since recent injection. Pt with mild lack of full PROM, and is limited with AROM due to pain and weakness. Pt with decreased ability for reaching, lifting, carrying, and most functional use of UE. Pt to benefit from skilled PT to improve deficits and pain.    Examination-Activity Limitations  Bathing;Reach Overhead;Carry;Dressing;Hygiene/Grooming;Lift    Examination-Participation Restrictions  Meal Prep;Cleaning;Community Activity;Driving;Laundry;Yard Work    Stability/Clinical Decision Making  Stable/Uncomplicated    Clinical Decision Making  Low    Rehab Potential  Good    PT Frequency  2x / week    PT Duration  6 weeks    PT Treatment/Interventions  ADLs/Self Care Home Management;Cryotherapy;Electrical Stimulation;Iontophoresis 4mg /ml Dexamethasone;Moist Heat;Traction;Ultrasound;DME Instruction;Neuromuscular re-education;Therapeutic exercise;Therapeutic activities;Stair training;Functional mobility training;Patient/family education;Manual techniques;Dry needling;Passive range of motion;Taping;Spinal Manipulations;Joint Manipulations    Consulted and Agree with Plan of Care  Patient       Patient will benefit from skilled therapeutic intervention in order to improve the following deficits and impairments:  Decreased range of motion, Impaired UE functional use, Increased muscle spasms, Pain,  Impaired flexibility, Hypomobility, Decreased mobility, Decreased strength  Visit Diagnosis: Acute pain of right shoulder  Stiffness of right shoulder, not elsewhere classified  Muscle weakness (generalized)     Problem List Patient Active Problem List   Diagnosis Date Noted  . Primary insomnia 05/21/2019  . Vitamin D deficiency 05/21/2019  . Nonallopathic lesion of cervical region 03/21/2019  . Nonallopathic lesion of thoracic region 03/21/2019  . Nonallopathic lesion of lumbar region 03/21/2019  . Cervical radiculopathy 03/01/2019  . Pelvic pain 07/12/2017  . Neck pain 06/13/2017  . Carpal tunnel syndrome 11/16/2016  . MRI of brain abnormal 08/23/2016  . Tinnitus, bilateral 07/27/2016  . Low serum vitamin D 11/11/2015  . Generalized anxiety disorder 12/18/2014    Sedalia Muta, PT, DPT 2:37 PM  07/12/19    [facility] [address] [place], [place], [address] Phone: [phone]   Fax:  [phone]  Name: [name] MRN: [id number] Date of Birth: 04-15-1976

## 2019-07-24 ENCOUNTER — Other Ambulatory Visit: Payer: Self-pay

## 2019-07-24 ENCOUNTER — Encounter: Payer: Self-pay | Admitting: Physical Therapy

## 2019-07-24 ENCOUNTER — Ambulatory Visit: Payer: No Typology Code available for payment source | Admitting: Physical Therapy

## 2019-07-24 DIAGNOSIS — M25511 Pain in right shoulder: Secondary | ICD-10-CM

## 2019-07-24 DIAGNOSIS — M6281 Muscle weakness (generalized): Secondary | ICD-10-CM

## 2019-07-24 DIAGNOSIS — M25611 Stiffness of right shoulder, not elsewhere classified: Secondary | ICD-10-CM

## 2019-07-24 NOTE — Therapy (Signed)
[facility] [address] [place], [place], [address] Phone: [phone]   Fax:  [phone]  Physical Therapy Treatment  Patient Details  Name: [name] MRN: [id number] Date of Birth: 09-May-1976 Referring Provider (PT): Supple   Encounter Date: 07/24/2019  PT End of Session - 07/24/19 1418    Visit Number  2    Number of Visits  12    Date for PT Re-Evaluation  08/23/19    Authorization Type  [facility]    PT Start Time  1308    PT Stop Time  1346    PT Time Calculation (min)  38 min    Activity Tolerance  Patient tolerated treatment well    Behavior During Therapy  [facility] for tasks assessed/performed       Past Medical History:  Diagnosis Date  . Anxiety   . Carpal tunnel syndrome   . Chronic lower back pain   . Chronic tension headaches   . Constipation   . Dysmenorrhea   . Endometrial polyp   . Fatigue   . GERD (gastroesophageal reflux disease)   . History of lumbar puncture 08/2016  . HPV in female   . Low serum vitamin D   . Neck pain, chronic   . Pelvic pain   . Thyroid nodule   . Tinnitus, bilateral   . TMJ (temporomandibular joint disorder)   . White matter abnormality on MRI of brain     Past Surgical History:  Procedure Laterality Date  . DE QUERVAIN'S RELEASE Bilateral   . DILATATION & CURETTAGE/HYSTEROSCOPY WITH MYOSURE N/A 10/24/2017   Procedure: DILATATION & CURETTAGE/HYSTEROSCOPY WITH MYOSURE;  Surgeon: Romualdo Bolk, MD;  Location: [facility];  Service: Gynecology;  Laterality: N/A;  . IUD REMOVAL N/A 10/24/2017   Procedure: INTRAUTERINE DEVICE (IUD) REMOVAL;  Surgeon: Romualdo Bolk, MD;  Location: [facility];  Service: Gynecology;  Laterality: N/A;  . TONSILLECTOMY    . WISDOM TOOTH EXTRACTION    . WRIST SURGERY Bilateral    2 ganglion cyst removed    There were no vitals filed for this visit.  Subjective Assessment - 07/24/19 1416    Subjective   Pt reports doing well, feels shoulder is moving better. Has been doing HEP.    Currently in Pain?  Yes    Pain Score  3     Pain Location  Shoulder    Pain Orientation  Right    Pain Descriptors / Indicators  Aching;Burning    Pain Type  Acute pain    Pain Onset  1 to 4 weeks ago    Pain Frequency  Intermittent                       [facility] Adult PT Treatment/Exercise - 07/24/19 1311      Exercises   Exercises  Shoulder      Shoulder Exercises: Supine   External Rotation  AAROM;10 reps    External Rotation Limitations  cane    Internal Rotation  AAROM;10 reps    Internal Rotation Limitations  cane    Flexion  AAROM;20 reps    Flexion Limitations  cane    Other Supine Exercises  AROM, flexion x10;  AROM horizontal abd x10;       Shoulder Exercises: Sidelying   External Rotation  20 reps    External Rotation Weight (lbs)  1      Shoulder Exercises: Standing  Internal Rotation  10 reps;Both    Internal Rotation Limitations  2 hands behind back;     Flexion  AAROM;10 reps    Flexion Limitations  cane    ABduction  AAROM;10 reps    ABduction Limitations  cane    Row  20 reps    Theraband Level (Shoulder Row)  Level 3 (Green)    Other Standing Exercises  AROM: Flex and scap x10 each;     Other Standing Exercises  Wall slides x15 flexion      Shoulder Exercises: Pulleys   Flexion  2 minutes    Scaption  1 minute      Shoulder Exercises: ROM/Strengthening   UBE (Upper Arm Bike)  Fluid Cycle, 4 min, fwd/bwd      Manual Therapy   Manual Therapy  Joint mobilization;Passive ROM    Joint Mobilization  Inf and post GHJ mobs    Passive ROM  For R shoulder, all motions.                PT Short Term Goals - 07/12/19 1315      PT SHORT TERM GOAL #1   Title  Pt to be independent with initial HEP    Time  2    Period  Weeks    Status  New    Target Date  07/26/19        PT Long Term Goals - 07/12/19 1431      PT LONG TERM GOAL #1   Title  Pt  to be independent with final HEP    Time  6    Period  Weeks    Status  New    Target Date  08/23/19      PT LONG TERM GOAL #2   Title  Pt to demo full AROM with R shoulder, to improve ability for ADLS.    Time  6    Period  Weeks    Status  New    Target Date  08/23/19      PT LONG TERM GOAL #3   Title  Pt to demo increased strength of R shoulder, to at least 4+/5, to improve ability for reaching, lifting, carrying, IADLS. and work duties    Time  6    Period  Weeks    Status  New    Target Date  08/23/19      PT LONG TERM GOAL #4   Title  Pt to report pain 0-2/10 with elevation and activity with R UE.    Time  6    Period  Weeks    Status  New    Target Date  08/23/19            Plan - 07/24/19 1419    Clinical Impression Statement  Pt with goog progress today, improved ROM in all motions. Able to progress ther ex today without increased pain, and able to start light AROM. Plan to progress as tolerated.    Examination-Activity Limitations  Bathing;Reach Overhead;Carry;Dressing;Hygiene/Grooming;Lift    Examination-Participation Restrictions  Meal Prep;Cleaning;Community Activity;Driving;Laundry;Yard Work    Stability/Clinical Decision Making  Stable/Uncomplicated    Rehab Potential  Good    PT Frequency  2x / week    PT Duration  6 weeks    PT Treatment/Interventions  ADLs/Self Care Home Management;Cryotherapy;Electrical Stimulation;Iontophoresis 4mg /ml Dexamethasone;Moist Heat;Traction;Ultrasound;DME Instruction;Neuromuscular re-education;Therapeutic exercise;Therapeutic activities;Stair training;Functional mobility training;Patient/family education;Manual techniques;Dry needling;Passive range of motion;Taping;Spinal Manipulations;Joint Manipulations    Consulted and Agree with Plan of  Care  Patient       Patient will benefit from skilled therapeutic intervention in order to improve the following deficits and impairments:  Decreased range of motion, Impaired UE  functional use, Increased muscle spasms, Pain, Impaired flexibility, Hypomobility, Decreased mobility, Decreased strength  Visit Diagnosis: Acute pain of right shoulder  Stiffness of right shoulder, not elsewhere classified  Muscle weakness (generalized)     Problem List Patient Active Problem List   Diagnosis Date Noted  . Primary insomnia 05/21/2019  . Vitamin D deficiency 05/21/2019  . Nonallopathic lesion of cervical region 03/21/2019  . Nonallopathic lesion of thoracic region 03/21/2019  . Nonallopathic lesion of lumbar region 03/21/2019  . Cervical radiculopathy 03/01/2019  . Pelvic pain 07/12/2017  . Neck pain 06/13/2017  . Carpal tunnel syndrome 11/16/2016  . MRI of brain abnormal 08/23/2016  . Tinnitus, bilateral 07/27/2016  . Low serum vitamin D 11/11/2015  . Generalized anxiety disorder 12/18/2014    Lyndee Hensen, PT, DPT 3:07 PM  07/24/19    [facility] [place], [place], [address] Phone: [phone]   Fax:  [phone]  Name: CAROLYN SYLVIA MRN: [id number] Date of Birth: 12/16/1975

## 2019-07-25 ENCOUNTER — Ambulatory Visit: Payer: No Typology Code available for payment source | Admitting: Family Medicine

## 2019-07-26 ENCOUNTER — Encounter: Payer: No Typology Code available for payment source | Admitting: Physical Therapy

## 2019-07-27 MED FILL — tiZANidine HCL 4 MG TABS: 4 | 30 days supply | Qty: 30 | Fill #0

## 2019-07-27 MED FILL — BACLOFEN 20 MG TABLET: 20 | 10 days supply | Qty: 30 | Fill #1

## 2019-07-31 ENCOUNTER — Ambulatory Visit: Payer: No Typology Code available for payment source | Admitting: Physical Therapy

## 2019-07-31 ENCOUNTER — Encounter: Payer: Self-pay | Admitting: Physical Therapy

## 2019-07-31 ENCOUNTER — Other Ambulatory Visit: Payer: Self-pay

## 2019-07-31 DIAGNOSIS — M6281 Muscle weakness (generalized): Secondary | ICD-10-CM | POA: Diagnosis not present

## 2019-07-31 DIAGNOSIS — M25611 Stiffness of right shoulder, not elsewhere classified: Secondary | ICD-10-CM | POA: Diagnosis not present

## 2019-07-31 DIAGNOSIS — M25511 Pain in right shoulder: Secondary | ICD-10-CM

## 2019-07-31 NOTE — Patient Instructions (Signed)
Access Code: [email or web address]  URL: [email or web address]  Date: 07/31/2019  Prepared by: Lyndee Hensen   Exercises Supine Shoulder Flexion with Dowel - 10 reps - 1 sets - 5 hold - 3x daily Scapular Retraction with Resistance - 10 reps - 2 sets - 2x daily Shoulder Internal Rotation with Resistance - 10 reps - 2 sets - 1x daily Standing Shoulder Internal Rotation Stretch with Hands Behind Back - 10 reps - 1 sets - 2x daily Shoulder External Rotation with Anchored Resistance - 10 reps - 2 sets - 1x daily Seated Shoulder Flexion AAROM with Pulley Behind - 10 reps - 2 sets - 2x daily Standing Shoulder Scaption - 10 reps - 1 sets - 1x daily

## 2019-07-31 NOTE — Therapy (Signed)
[facility] [address] [place], [place], [address] Phone: [phone]   Fax:  [phone]  Physical Therapy Treatment  Patient Details  Name: [name] MRN: [id number] Date of Birth: [birth year] Referring Provider (PT): Supple   Encounter Date: 07/31/2019  PT End of Session - 07/31/19 1309    Visit Number  3    Number of Visits  12    Date for PT Re-Evaluation  08/23/19    Authorization Type  [facility]    PT Start Time  1304    PT Stop Time  1345    PT Time Calculation (min)  41 min    Activity Tolerance  Patient tolerated treatment well    Behavior During Therapy  [facility] for tasks assessed/performed       Past Medical History:  Diagnosis Date  . Anxiety   . Carpal tunnel syndrome   . Chronic lower back pain   . Chronic tension headaches   . Constipation   . Dysmenorrhea   . Endometrial polyp   . Fatigue   . GERD (gastroesophageal reflux disease)   . History of lumbar puncture 08/2016  . HPV in female   . Low serum vitamin D   . Neck pain, chronic   . Pelvic pain   . Thyroid nodule   . Tinnitus, bilateral   . TMJ (temporomandibular joint disorder)   . White matter abnormality on MRI of brain     Past Surgical History:  Procedure Laterality Date  . DE QUERVAIN'S RELEASE Bilateral   . DILATATION & CURETTAGE/HYSTEROSCOPY WITH MYOSURE N/A 10/24/2017   Procedure: DILATATION & CURETTAGE/HYSTEROSCOPY WITH MYOSURE;  Surgeon: Romualdo BolkJertson, Jill Evelyn, MD;  [facility];  Service: Gynecology;  Laterality: N/A;  . IUD REMOVAL N/A 10/24/2017   Procedure: INTRAUTERINE DEVICE (IUD) REMOVAL;  Surgeon: Romualdo BolkJertson, Jill Evelyn, MD;  [facility];  Service: Gynecology;  Laterality: N/A;  . TONSILLECTOMY    . WISDOM TOOTH EXTRACTION    . WRIST SURGERY Bilateral    2 ganglion cyst removed    There were no vitals filed for this visit.  Subjective Assessment - 07/31/19 1308    Subjective   States doing HEP. Feels shoulder is improivng, still sore.    Patient Stated Goals  Decreased pain, increased ROM and function of UE.    Currently in Pain?  Yes    Pain Score  2     Pain Location  Shoulder    Pain Orientation  Right    Pain Descriptors / Indicators  Aching    Pain Type  Acute pain    Pain Onset  1 to 4 weeks ago    Pain Frequency  Intermittent                       [facility] Adult PT Treatment/Exercise - 07/31/19 1220      Exercises   Exercises  Shoulder      Shoulder Exercises: Supine   External Rotation  Strengthening;15 reps    External Rotation Weight (lbs)  2    External Rotation Limitations  --    Internal Rotation  --    Internal Rotation Limitations  --    Flexion  AAROM;20 reps    Flexion Limitations  cane    Other Supine Exercises  AROM, flexion x10;  AROM horizontal abd x10;       Shoulder Exercises: Sidelying   External Rotation  --  External Rotation Weight (lbs)  --      Shoulder Exercises: Standing   External Rotation  15 reps    Theraband Level (Shoulder External Rotation)  Level 2 (Red)    Internal Rotation  15 reps    Theraband Level (Shoulder Internal Rotation)  Level 2 (Red)    Internal Rotation Limitations  --    Flexion  --    Flexion Limitations  --    ABduction  --    ABduction Limitations  cane    Row  20 reps    Theraband Level (Shoulder Row)  Level 3 (Green)    Other Standing Exercises  AROM: Flex and abd full ROM, x10 each;     Other Standing Exercises  Wall slides x15 flexion, Wall push ups x15;       Shoulder Exercises: Pulleys   Flexion  2 minutes    Scaption  1 minute      Shoulder Exercises: ROM/Strengthening   UBE (Upper Arm Bike)  Fluid Cycle, 4 min, fwd/bwd      Manual Therapy   Manual Therapy  Joint mobilization;Passive ROM    Joint Mobilization  Inf and post GHJ mobs    Passive ROM  For R shoulder, all motions.                PT Short Term Goals - 07/12/19 1315      PT SHORT  TERM GOAL #1   Title  Pt to be independent with initial HEP    Time  2    Period  Weeks    Status  New    Target Date  07/26/19        PT Long Term Goals - 07/12/19 1431      PT LONG TERM GOAL #1   Title  Pt to be independent with final HEP    Time  6    Period  Weeks    Status  New    Target Date  08/23/19      PT LONG TERM GOAL #2   Title  Pt to demo full AROM with R shoulder, to improve ability for ADLS.    Time  6    Period  Weeks    Status  New    Target Date  08/23/19      PT LONG TERM GOAL #3   Title  Pt to demo increased strength of R shoulder, to at least 4+/5, to improve ability for reaching, lifting, carrying, IADLS. and work duties    Time  6    Period  Weeks    Status  New    Target Date  08/23/19      PT LONG TERM GOAL #4   Title  Pt to report pain 0-2/10 with elevation and activity with R UE.    Time  6    Period  Weeks    Status  New    Target Date  08/23/19            Plan - 07/31/19 1310    Clinical Impression Statement  Pt making good progress, with improved PROM and AROM. Light strengthening started today, HEP updated. Pt with ability for repeated AROM (flexion) without pain. Still has pain with end range ER. Plan to progress as tolerated.    Examination-Activity Limitations  Bathing;Reach Overhead;Carry;Dressing;Hygiene/Grooming;Lift    Examination-Participation Restrictions  Meal Prep;Cleaning;Community Activity;Driving;Laundry;Yard Work    Stability/Clinical Decision Making  Stable/Uncomplicated    Rehab Potential  Good  PT Frequency  2x / week    PT Duration  6 weeks    PT Treatment/Interventions  ADLs/Self Care Home Management;Cryotherapy;Electrical Stimulation;Iontophoresis 4mg /ml Dexamethasone;Moist Heat;Traction;Ultrasound;DME Instruction;Neuromuscular re-education;Therapeutic exercise;Therapeutic activities;Stair training;Functional mobility training;Patient/family education;Manual techniques;Dry needling;Passive range of  motion;Taping;Spinal Manipulations;Joint Manipulations    Consulted and Agree with Plan of Care  Patient       Patient will benefit from skilled therapeutic intervention in order to improve the following deficits and impairments:  Decreased range of motion, Impaired UE functional use, Increased muscle spasms, Pain, Impaired flexibility, Hypomobility, Decreased mobility, Decreased strength  Visit Diagnosis: Acute pain of right shoulder  Stiffness of right shoulder, not elsewhere classified  Muscle weakness (generalized)     Problem List Patient Active Problem List   Diagnosis Date Noted  . Primary insomnia 05/21/2019  . Vitamin D deficiency 05/21/2019  . Nonallopathic lesion of cervical region 03/21/2019  . Nonallopathic lesion of thoracic region 03/21/2019  . Nonallopathic lesion of lumbar region 03/21/2019  . Cervical radiculopathy 03/01/2019  . Pelvic pain 07/12/2017  . Neck pain 06/13/2017  . Carpal tunnel syndrome 11/16/2016  . MRI of brain abnormal 08/23/2016  . Tinnitus, bilateral 07/27/2016  . Low serum vitamin D 11/11/2015  . Generalized anxiety disorder 12/18/2014    Lyndee Hensen, PT, DPT 1:14 PM  07/31/19    [place], [place], [address] Phone: [phone]   Fax:  [phone]  Name: [name] MRN: [id number] Date of Birth: 1976-06-08

## 2019-08-02 ENCOUNTER — Encounter: Payer: Self-pay | Admitting: Physical Therapy

## 2019-08-02 ENCOUNTER — Ambulatory Visit (INDEPENDENT_AMBULATORY_CARE_PROVIDER_SITE_OTHER): Payer: No Typology Code available for payment source | Admitting: Physical Therapy

## 2019-08-02 DIAGNOSIS — M25511 Pain in right shoulder: Secondary | ICD-10-CM

## 2019-08-02 DIAGNOSIS — M6281 Muscle weakness (generalized): Secondary | ICD-10-CM | POA: Diagnosis not present

## 2019-08-02 DIAGNOSIS — M25611 Stiffness of right shoulder, not elsewhere classified: Secondary | ICD-10-CM

## 2019-08-02 NOTE — Therapy (Signed)
[facility] [address] [place], [place], [address] Phone: [phone]   Fax:  [phone]  Physical Therapy Treatment  Patient Details  Name: [name] MRN: [id number] Date of Birth: [birth year] Referring Provider (PT): Supple   Encounter Date: 08/02/2019  PT End of Session - 08/02/19 1251    Visit Number  4    Number of Visits  12    Date for PT Re-Evaluation  08/23/19    Authorization Type  [facility]    PT Start Time  1217    PT Stop Time  1300    PT Time Calculation (min)  43 min    Activity Tolerance  Patient tolerated treatment well    Behavior During Therapy  [facility] for tasks assessed/performed       Past Medical History:  Diagnosis Date  . Anxiety   . Carpal tunnel syndrome   . Chronic lower back pain   . Chronic tension headaches   . Constipation   . Dysmenorrhea   . Endometrial polyp   . Fatigue   . GERD (gastroesophageal reflux disease)   . History of lumbar puncture 08/2016  . HPV in female   . Low serum vitamin D   . Neck pain, chronic   . Pelvic pain   . Thyroid nodule   . Tinnitus, bilateral   . TMJ (temporomandibular joint disorder)   . White matter abnormality on MRI of brain     Past Surgical History:  Procedure Laterality Date  . DE QUERVAIN'S RELEASE Bilateral   . DILATATION & CURETTAGE/HYSTEROSCOPY WITH MYOSURE N/A 10/24/2017   Procedure: DILATATION & CURETTAGE/HYSTEROSCOPY WITH MYOSURE;  Surgeon: Romualdo Bolk, MD;  [facility];  Service: Gynecology;  Laterality: N/A;  . IUD REMOVAL N/A 10/24/2017   Procedure: INTRAUTERINE DEVICE (IUD) REMOVAL;  Surgeon: Romualdo Bolk, MD;  Location: [facility];  Service: Gynecology;  Laterality: N/A;  . TONSILLECTOMY    . WISDOM TOOTH EXTRACTION    . WRIST SURGERY Bilateral    2 ganglion cyst removed    There were no vitals filed for this visit.  Subjective Assessment - 08/02/19 1250    Subjective   States improvements in pain and ROM.    Currently in Pain?  Yes    Pain Score  2     Pain Location  Shoulder    Pain Orientation  Right    Pain Descriptors / Indicators  Aching    Pain Type  Acute pain    Pain Onset  1 to 4 weeks ago    Pain Frequency  Intermittent                       [facility] Adult PT Treatment/Exercise - 08/02/19 1223      Exercises   Exercises  Shoulder      Shoulder Exercises: Supine   External Rotation  Strengthening;15 reps    External Rotation Weight (lbs)  2    Flexion  Strengthening;15 reps    Shoulder Flexion Weight (lbs)  2    Flexion Limitations  --    Other Supine Exercises  AROM, flexion x10;  AROM horizontal abd x10;     Other Supine Exercises  Horiz Abd 2 lb x15;       Shoulder Exercises: Sidelying   ABduction  15 reps    Other Sidelying Exercises  Horizontal Abd x15       Shoulder Exercises:  Standing   External Rotation  15 reps    Theraband Level (Shoulder External Rotation)  Level 2 (Red)    Internal Rotation  15 reps    Theraband Level (Shoulder Internal Rotation)  Level 2 (Red)    ABduction Limitations  --    Row  20 reps    Theraband Level (Shoulder Row)  Level 3 (Green)    Other Standing Exercises  AROM: Flex and abd full ROM, x10 each;     Other Standing Exercises  Wall slides x15 flexion and abd;   Wall push ups x15;       Shoulder Exercises: Pulleys   Flexion  2 minutes    Scaption  1 minute      Shoulder Exercises: ROM/Strengthening   UBE (Upper Arm Bike)  Fluid Cycle, 5 min, fwd/bwd      Shoulder Exercises: Stretch   Corner Stretch  --      Manual Therapy   Manual Therapy  Joint mobilization;Passive ROM    Joint Mobilization  Inf and post GHJ mobs    Passive ROM  For R shoulder, all motions.              PT Education - 08/02/19 1312    Education Details  HEP updated / reviewed    Person(s) Educated  Patient    Methods  Explanation;Demonstration    Comprehension  Verbalized  understanding;Returned demonstration;Verbal cues required       PT Short Term Goals - 08/02/19 1312      PT SHORT TERM GOAL #1   Title  Pt to be independent with initial HEP    Time  2    Period  Weeks    Status  Achieved    Target Date  07/26/19        PT Long Term Goals - 07/12/19 1431      PT LONG TERM GOAL #1   Title  Pt to be independent with final HEP    Time  6    Period  Weeks    Status  New    Target Date  08/23/19      PT LONG TERM GOAL #2   Title  Pt to demo full AROM with R shoulder, to improve ability for ADLS.    Time  6    Period  Weeks    Status  New    Target Date  08/23/19      PT LONG TERM GOAL #3   Title  Pt to demo increased strength of R shoulder, to at least 4+/5, to improve ability for reaching, lifting, carrying, IADLS. and work duties    Time  6    Period  Weeks    Status  New    Target Date  08/23/19      PT LONG TERM GOAL #4   Title  Pt to report pain 0-2/10 with elevation and activity with R UE.    Time  6    Period  Weeks    Status  New    Target Date  08/23/19            Plan - 08/02/19 1300    Clinical Impression Statement  Pt progressing well with PROM and AROM, as well as pain. Improved ability for functional activity. Does have pain at end range for ER. Plan to progress strength as able.    Examination-Activity Limitations  Bathing;Reach Overhead;Carry;Dressing;Hygiene/Grooming;Lift    Examination-Participation Restrictions  Meal Prep;Cleaning;Community Activity;Driving;Laundry;Pincus Badder Work  Stability/Clinical Decision Making  Stable/Uncomplicated    Rehab Potential  Good    PT Frequency  2x / week    PT Duration  6 weeks    PT Treatment/Interventions  ADLs/Self Care Home Management;Cryotherapy;Electrical Stimulation;Iontophoresis 4mg /ml Dexamethasone;Moist Heat;Traction;Ultrasound;DME Instruction;Neuromuscular re-education;Therapeutic exercise;Therapeutic activities;Stair training;Functional mobility  training;Patient/family education;Manual techniques;Dry needling;Passive range of motion;Taping;Spinal Manipulations;Joint Manipulations    Consulted and Agree with Plan of Care  Patient       Patient will benefit from skilled therapeutic intervention in order to improve the following deficits and impairments:  Decreased range of motion, Impaired UE functional use, Increased muscle spasms, Pain, Impaired flexibility, Hypomobility, Decreased mobility, Decreased strength  Visit Diagnosis: Acute pain of right shoulder  Stiffness of right shoulder, not elsewhere classified  Muscle weakness (generalized)     Problem List Patient Active Problem List   Diagnosis Date Noted  . Primary insomnia 05/21/2019  . Vitamin D deficiency 05/21/2019  . Nonallopathic lesion of cervical region 03/21/2019  . Nonallopathic lesion of thoracic region 03/21/2019  . Nonallopathic lesion of lumbar region 03/21/2019  . Cervical radiculopathy 03/01/2019  . Pelvic pain 07/12/2017  . Neck pain 06/13/2017  . Carpal tunnel syndrome 11/16/2016  . MRI of brain abnormal 08/23/2016  . Tinnitus, bilateral 07/27/2016  . Low serum vitamin D 11/11/2015  . Generalized anxiety disorder 12/18/2014    Lyndee Hensen, PT, DPT 1:15 PM  08/02/19    [place], [place], [address] Phone: [phone]   Fax:  [phone]  Name: KOREY ARROYO MRN: [id number] Date of Birth: 1976-08-08

## 2019-08-13 MED FILL — BACLOFEN 20 MG TABLET: 20 | 20 days supply | Qty: 60 | Fill #0

## 2019-08-13 MED FILL — GABAPENTIN 100 MG CAPSULE: 100 | 90 days supply | Qty: 180 | Fill #2

## 2019-08-13 MED FILL — LORazepam 1 MG TABS: 1 | 90 days supply | Qty: 90 | Fill #1

## 2019-08-15 ENCOUNTER — Ambulatory Visit: Payer: No Typology Code available for payment source | Admitting: Obstetrics and Gynecology

## 2019-08-19 MED FILL — BLISOVI FE 1/20 1-20 MG-MCG: 1-20 | 84 days supply | Qty: 84 | Fill #2

## 2019-08-29 MED FILL — traMADol HCL 50 MG TABS: 50 | 15 days supply | Qty: 30 | Fill #0

## 2019-08-31 ENCOUNTER — Encounter: Payer: Self-pay | Admitting: Family Medicine

## 2019-09-03 ENCOUNTER — Encounter: Payer: No Typology Code available for payment source | Admitting: Physical Therapy

## 2019-09-03 MED ORDER — TIZANIDINE HCL 4 MG PO TABS: 4.0000 mg | ORAL_TABLET | Freq: Every evening | ORAL | 1 refills | Status: DC

## 2019-09-03 MED FILL — tiZANidine HCL 4 MG TABS: 4 | 10 days supply | Qty: 30 | Fill #0

## 2019-09-03 MED FILL — buPROPion HCL ER (XL) 150 M: 150 | 90 days supply | Qty: 90 | Fill #1

## 2019-09-03 MED FILL — TROKENDI XR 100 MG CAPSULE: 100 | 90 days supply | Qty: 90 | Fill #1

## 2019-09-05 ENCOUNTER — Encounter: Payer: Self-pay | Admitting: Physical Therapy

## 2019-09-05 ENCOUNTER — Ambulatory Visit: Payer: No Typology Code available for payment source | Admitting: Physical Therapy

## 2019-09-05 DIAGNOSIS — M25611 Stiffness of right shoulder, not elsewhere classified: Secondary | ICD-10-CM | POA: Diagnosis not present

## 2019-09-05 DIAGNOSIS — M25511 Pain in right shoulder: Secondary | ICD-10-CM | POA: Diagnosis not present

## 2019-09-05 DIAGNOSIS — M6281 Muscle weakness (generalized): Secondary | ICD-10-CM

## 2019-09-05 NOTE — Patient Instructions (Signed)
Access Code: [email or web address]  URL: [email or web address]  Date: 09/05/2019  Prepared by: Lyndee Hensen   Exercises Supine Shoulder Flexion with Dowel - 10 reps - 1 sets - 5 hold - 3x daily Supine Shoulder External Rotation with Dowel - 10 reps - 1 sets - 2x daily Standing Shoulder Internal Rotation Stretch with Hands Behind Back - 10 reps - 1 sets - 2x daily Scapular Retraction with Resistance - 10 reps - 2 sets - 2x daily Shoulder Internal Rotation with Resistance - 10 reps - 2 sets - 1x daily Shoulder External Rotation with Anchored Resistance - 10 reps - 2 sets - 1x daily Supine Single Arm Shoulder External Rotation AROM - 10 reps - 2 sets - 1x daily Supine Shoulder External Rotation Stretch - 10 reps - 1 sets - 10 hold - 1x daily Seated Shoulder Flexion AAROM with Pulley Behind - 10 reps - 2 sets - 2x daily Standing Shoulder Scaption - 10 reps - 1 sets - 1x daily Tricep Push Up on Wall - 10 reps - 2 sets - 1x daily Doorway Pec Stretch at 90 Degrees Abduction - 3 reps - 30 hold - 2x daily Doorway Pec Stretch at 60 Elevation - 3 reps - 30 hold - 2x daily

## 2019-09-05 NOTE — Therapy (Addendum)
[facility] [address] [place], [place], [address] Phone: [phone]   Fax:  [phone]  Physical Therapy Treatment/Re-Cert   Patient Details  Name: [name] MRN: [id number] Date of Birth: Jan 16, 1976 Referring Provider (PT): Supple   Encounter Date: 09/05/2019  PT End of Session - 09/05/19 1210     Visit Number  5    Number of Visits  20    Date for PT Re-Evaluation  10/17/19    Authorization Type  [facility]    PT Start Time  1205    PT Stop Time  1245    PT Time Calculation (min)  40 min    Activity Tolerance  Patient tolerated treatment well    Behavior During Therapy  [facility] for tasks assessed/performed        Past Medical History:  Diagnosis Date   Anxiety    Carpal tunnel syndrome    Chronic lower back pain    Chronic tension headaches    Constipation    Dysmenorrhea    Endometrial polyp    Fatigue    GERD (gastroesophageal reflux disease)    History of lumbar puncture 08/2016   HPV in female    Low serum vitamin D    Neck pain, chronic    Pelvic pain    Thyroid nodule    Tinnitus, bilateral    TMJ (temporomandibular joint disorder)    White matter abnormality on MRI of brain     Past Surgical History:  Procedure Laterality Date   DE QUERVAIN'S RELEASE Bilateral    DILATATION & CURETTAGE/HYSTEROSCOPY WITH MYOSURE N/A 10/24/2017   Procedure: Sevier;  Surgeon: Salvadore Dom, MD;  Location: [facility];  Service: Gynecology;  Laterality: N/A;   IUD REMOVAL N/A 10/24/2017   Procedure: INTRAUTERINE DEVICE (IUD) REMOVAL;  Surgeon: Salvadore Dom, MD;  Location: [facility];  Service: Gynecology;  Laterality: N/A;   TONSILLECTOMY     WISDOM TOOTH EXTRACTION     WRIST SURGERY Bilateral    2 ganglion cyst removed    There were no vitals filed for this visit.  Subjective Assessment - 09/05/19 1210     Subjective  Pt had  2nd injection about 2 weeks ago. She states soreness initially, but now has calmed down. Felt like shoulder was stiffening up again, prior to last MD appt. She states minimal soreness today, but still has difficulty with certain motions, Has been doing HEP.    Currently in Pain?  Yes    Pain Score  2     Pain Location  Shoulder    Pain Orientation  Right    Pain Descriptors / Indicators  Aching    Pain Type  Acute pain    Pain Onset  More than a month ago    Pain Frequency  Intermittent          [facility] PT Assessment - 09/05/19 0001       AROM   Right Shoulder Flexion  150 Degrees    Right Shoulder ABduction  150 Degrees    Right Shoulder Internal Rotation  70 Degrees    Right Shoulder External Rotation  75 Degrees      PROM   Right Shoulder Flexion  155 Degrees    Right Shoulder ABduction  155 Degrees    Right Shoulder Internal Rotation  70 Degrees    Right Shoulder External Rotation  80 Degrees  Strength   Right Shoulder Flexion  4/5    Right Shoulder ABduction  4/5    Right Shoulder Internal Rotation  4+/5    Right Shoulder External Rotation  4+/5                    [facility] Adult PT Treatment/Exercise - 09/05/19 1213       Exercises   Exercises  Shoulder      Shoulder Exercises: Supine   External Rotation  Strengthening;15 reps    External Rotation Weight (lbs)  2    Flexion  15 reps;AAROM    Shoulder Flexion Weight (lbs)  --    Flexion Limitations  cane    Other Supine Exercises  --    Other Supine Exercises  cane, ER x10; ER stretngthening in neutral 2lb x20;       Shoulder Exercises: Sidelying   ABduction  --    Other Sidelying Exercises  --      Shoulder Exercises: Standing   External Rotation  20 reps    Theraband Level (Shoulder External Rotation)  Level 2 (Red)    Internal Rotation  20 reps    Theraband Level (Shoulder Internal Rotation)  Level 2 (Red)    Row  20 reps    Theraband Level (Shoulder Row)  Level 3 (Green)    Other  Standing Exercises  AROM: Flex and abd full ROM, x10 each;     Other Standing Exercises   Wall push ups x15;       Shoulder Exercises: Pulleys   Flexion  2 minutes    Scaption  1 minute      Shoulder Exercises: ROM/Strengthening   UBE (Upper Arm Bike)  --      Shoulder Exercises: Stretch   Corner Stretch  3 reps;30 seconds    Corner Stretch Limitations  45 and 90 deg    Internal Rotation Stretch  10 seconds    Internal Rotation Stretch Limitations  x10 hands behind back      Manual Therapy   Manual Therapy  Joint mobilization;Passive ROM    Joint Mobilization  Inf and post GHJ mobs    Passive ROM  For R shoulder, all motions.               PT Education - 09/05/19 1421     Education Details  HEP reviewed    Person(s) Educated  Patient    Methods  Explanation;Demonstration;Tactile cues;Verbal cues;Handout    Comprehension  Verbalized understanding;Returned demonstration;Verbal cues required        PT Short Term Goals - 08/02/19 1312       PT SHORT TERM GOAL #1   Title  Pt to be independent with initial HEP    Time  2    Period  Weeks    Status  Achieved    Target Date  07/26/19         PT Long Term Goals - 09/05/19 1423       PT LONG TERM GOAL #1   Title  Pt to be independent with final HEP    Time  6    Period  Weeks    Status  On-going    Target Date  10/17/19      PT LONG TERM GOAL #2   Title  Pt to demo full AROM with R shoulder, to improve ability for ADLS.    Time  6    Period  Weeks  Status  Partially Met    Target Date  10/17/19      PT LONG TERM GOAL #3   Title  Pt to demo increased strength of R shoulder, to at least 4+/5, to improve ability for reaching, lifting, carrying, IADLS. and work duties    Time  6    Period  Weeks    Status  Partially Met    Target Date  10/17/19      PT LONG TERM GOAL #4   Title  Pt to report pain 0-2/10 with elevation and activity with R UE.    Time  6    Period  Weeks    Status  New              Plan - 09/05/19 1424     Clinical Impression Statement  Pt making good progress. She has improving PROM and AROM, as well as strength. She has been able to progress strengthening without increased pain. She has pain at end range of flexion and ER, and mild strength deficits.   HEP progressed and reviewed today. Goals partially met. Pt still with limitations with full funcitonal activity .Pt starting new job, and my not be able to attend PT as often, will benefit from continuation of care, for 2-3 more visits to progress strengthening, and optimize function.    Examination-Activity Limitations  Bathing;Reach Overhead;Carry;Dressing;Hygiene/Grooming;Lift    Examination-Participation Restrictions  Meal Prep;Cleaning;Community Activity;Driving;Laundry;Yard Work    Stability/Clinical Decision Making  Stable/Uncomplicated    Rehab Potential  Good    PT Frequency  2x / week    PT Duration  6 weeks    PT Treatment/Interventions  ADLs/Self Care Home Management;Cryotherapy;Electrical Stimulation;Iontophoresis 105m/ml Dexamethasone;Moist Heat;Traction;Ultrasound;DME Instruction;Neuromuscular re-education;Therapeutic exercise;Therapeutic activities;Stair training;Functional mobility training;Patient/family education;Manual techniques;Dry needling;Passive range of motion;Taping;Spinal Manipulations;Joint Manipulations    Consulted and Agree with Plan of Care  Patient        Patient will benefit from skilled therapeutic intervention in order to improve the following deficits and impairments:  Decreased range of motion, Impaired UE functional use, Increased muscle spasms, Pain, Impaired flexibility, Hypomobility, Decreased mobility, Decreased strength  Visit Diagnosis: Acute pain of right shoulder  Stiffness of right shoulder, not elsewhere classified  Muscle weakness (generalized)     Problem List Patient Active Problem List   Diagnosis Date Noted   Primary insomnia 05/21/2019    Vitamin D deficiency 05/21/2019   Nonallopathic lesion of cervical region 03/21/2019   Nonallopathic lesion of thoracic region 03/21/2019   Nonallopathic lesion of lumbar region 03/21/2019   Cervical radiculopathy 03/01/2019   Pelvic pain 07/12/2017   Neck pain 06/13/2017   Carpal tunnel syndrome 11/16/2016   MRI of brain abnormal 08/23/2016   Tinnitus, bilateral 07/27/2016   Low serum vitamin D 11/11/2015   Generalized anxiety disorder 12/18/2014   LLyndee Hensen PT, DPT 3:48 PM  09/05/19   [place] [address]Phone: [phone]  Fax:  [phone] Name: ASHANAVIA MAKELAMRN: [id number] of Birth: 402/24/1977  PHYSICAL THERAPY DISCHARGE SUMMARY  Visits from Start of Care: 5   Plan: Patient agrees to discharge.  Patient goals were partially met. Patient is being discharged due to - not returning since last visit.    LLyndee Hensen PT, DPT 1:17 PM  06/14/22

## 2019-09-10 ENCOUNTER — Encounter: Payer: No Typology Code available for payment source | Admitting: Physical Therapy

## 2019-09-10 MED FILL — BLISOVI FE 1/20 1-20 MG-MCG: 1-20 | 84 days supply | Qty: 84 | Fill #2

## 2019-09-12 ENCOUNTER — Encounter: Payer: No Typology Code available for payment source | Admitting: Physical Therapy

## 2019-09-12 ENCOUNTER — Other Ambulatory Visit: Payer: Self-pay | Admitting: Family Medicine

## 2019-09-12 MED ORDER — VALACYCLOVIR HCL 1 G PO TABS: 1000.0000 mg | ORAL_TABLET | Freq: Three times a day (TID) | ORAL | 0 refills | Status: DC

## 2019-09-12 MED FILL — valACYclovir HCL 1 GM TABS: 1 | 7 days supply | Qty: 21 | Fill #0

## 2019-09-12 MED FILL — tiZANidine HCL 4 MG TABS: 4 | 10 days supply | Qty: 30 | Fill #1

## 2019-09-12 NOTE — Progress Notes (Signed)
Patient seen in clinic. Complains of really bad pain/burning along dermatomal pattern of left gluteus. No visible rash.  Sending in valtrex for her for possible shingles. Also recommended otc tiger balm and ibuprofen for pain.   Alejandra Flaming, MD [place]

## 2019-09-14 ENCOUNTER — Telehealth: Payer: No Typology Code available for payment source | Admitting: Physician Assistant

## 2019-09-14 ENCOUNTER — Encounter: Payer: Self-pay | Admitting: Physician Assistant

## 2019-09-14 DIAGNOSIS — M25552 Pain in left hip: Secondary | ICD-10-CM

## 2019-09-14 NOTE — Progress Notes (Signed)
Based on what you shared with me, I feel your condition warrants further evaluation and I recommend that you be seen for a face to face office visit. Please contact your primary care doctor for follow up , otherwise you may follow up at an urgent care center for your severe pain.  Hello [name],  Sorry to hear you are in such discomfort. You stated that you are being treated for shingles, and you have not developed the typical rash of shingles.  Since you are not improving, I would recommend having a face to face evaluation of your symptoms to ensure that other causes for your pain are not missed.    NOTE: If you entered your credit card information for this eVisit, you will not be charged. You may see a "hold" on your card for the $35 but that hold will drop off and you will not have a charge processed.   If you are having a true medical emergency please call 911.      For an urgent face to face visit,  has five urgent care centers for your convenience:      NEW:  [facility] at [facility] Directions [phone] [address] [place], [place] [address] . 10 am - 6pm Monday - Friday    [facility]) Get Driving Directions [phone] [address] [place], [place] [address] . 10 am to 8 pm Monday-Friday . 12 pm to 8 pm [facility] at [facility] Get Driving Directions [phone] [address] [place], [place] [address] . 8 am to 8 pm Monday-Friday . 9 am to 6 pm Saturday . 11 am to 6 pm Sunday     [facility] at [facility] Get Driving Directions  [phone] [address] [place], [place] [address] . 8 am to 8 pm Monday-Friday . 8 am to 4 pm [facility]gent Care at [facility] Directions [phone] [address] [place], [place] [address] . 12 pm to 6  pm Monday-Friday      Your e-visit answers were reviewed by a board certified advanced clinical practitioner to complete your personal care plan.  Thank you for using e-Visits.    I spent 5-10 minutes on review and completion of this note- [facility]

## 2019-09-17 MED FILL — FLUoxetine HCL 20 MG CAPS: 20 | 90 days supply | Qty: 90 | Fill #1

## 2019-09-20 ENCOUNTER — Other Ambulatory Visit: Payer: Self-pay | Admitting: Family Medicine

## 2019-09-20 DIAGNOSIS — K219 Gastro-esophageal reflux disease without esophagitis: Secondary | ICD-10-CM

## 2019-09-20 NOTE — Telephone Encounter (Signed)
Rx request 

## 2019-09-21 ENCOUNTER — Other Ambulatory Visit: Payer: Self-pay

## 2019-09-21 DIAGNOSIS — K219 Gastro-esophageal reflux disease without esophagitis: Secondary | ICD-10-CM

## 2019-09-21 MED ORDER — ESOMEPRAZOLE MAGNESIUM 40 MG PO CPDR: 40.0000 mg | DELAYED_RELEASE_CAPSULE | Freq: Every day | ORAL | 1 refills | Status: DC

## 2019-09-21 MED FILL — ESOMEPRAZOLE MAG DR 40 MG C: 40 | 90 days supply | Qty: 90 | Fill #0

## 2019-09-30 ENCOUNTER — Telehealth: Payer: No Typology Code available for payment source | Admitting: Physician Assistant

## 2019-09-30 DIAGNOSIS — J019 Acute sinusitis, unspecified: Secondary | ICD-10-CM | POA: Diagnosis not present

## 2019-09-30 DIAGNOSIS — B9789 Other viral agents as the cause of diseases classified elsewhere: Secondary | ICD-10-CM | POA: Diagnosis not present

## 2019-09-30 MED ORDER — IPRATROPIUM BROMIDE 0.03 % NA SOLN: 2.0000 | Freq: Two times a day (BID) | NASAL | 0 refills | Status: DC

## 2019-09-30 NOTE — Progress Notes (Signed)
I have spent 5 minutes in review of e-visit questionnaire, review and updating patient chart, medical decision making and response to patient.   Jansen Sciuto Cody Eliany Mccarter, PA-C    

## 2019-09-30 NOTE — Progress Notes (Signed)
We are sorry that you are not feeling well.  Here is how we plan to help!  Based on what you have shared with me it looks like you have sinusitis.  Sinusitis is inflammation and infection in the sinus cavities of the head.  Based on your presentation I believe you most likely have Acute Viral Sinusitis.This is an infection most likely caused by a virus. There is not specific treatment for viral sinusitis other than to help you with the symptoms until the infection runs its course.  You may use an oral decongestant such as Mucinex D or if you have glaucoma or high blood pressure use plain Mucinex. Saline nasal spray help and can safely be used as often as needed for congestion, I have prescribed: Ipratropium Bromide nasal spray 0.03% 2 sprays in eah nostril 2-3 times a day  Some authorities believe that zinc sprays or the use of Echinacea may shorten the course of your symptoms.  Sinus infections are not as easily transmitted as other respiratory infection, however we still recommend that you avoid close contact with loved ones, especially the very young and elderly.  Remember to wash your hands thoroughly throughout the day as this is the number one way to prevent the spread of infection!  If symptoms are not easing up in the next 48-72 hours, let us know and we will consider antibiotic treatments. We are not authorized to give antibiotics at this point as symptoms have only been present for a couple of days and most sinus infections are actually viral in nature. If not improving, let us know!  Home Care:  Only take medications as instructed by your medical team.  Do not take these medications with alcohol.  A steam or ultrasonic humidifier can help congestion.  You can place a towel over your head and breathe in the steam from hot water coming from a faucet.  Avoid close contacts especially the very young and the elderly.  Cover your mouth when you cough or sneeze.  Always remember to wash  your hands.  Get Help Right Away If:  You develop worsening fever or sinus pain.  You develop a severe head ache or visual changes.  Your symptoms persist after you have completed your treatment plan.  Make sure you  Understand these instructions.  Will watch your condition.  Will get help right away if you are not doing well or get worse.  Your e-visit answers were reviewed by a board certified advanced clinical practitioner to complete your personal care plan.  Depending on the condition, your plan could have included both over the counter or prescription medications.  If there is a problem please reply  once you have received a response from your provider.  Your safety is important to [place].  If you have drug allergies check your prescription carefully.    You can use MyChart to ask questions about today's visit, request a non-urgent call back, or ask for a work or school excuse for 24 hours related to this e-Visit. If it has been greater than 24 hours you will need to follow up with your provider, or enter a new e-Visit to address those concerns.  You will get an e-mail in the next two days asking about your experience.  I hope that your e-visit has been valuable and will speed your recovery. Thank you for using e-visits.

## 2019-10-15 ENCOUNTER — Other Ambulatory Visit: Payer: Self-pay | Admitting: Family Medicine

## 2019-10-16 MED FILL — tiZANidine HCL 4 MG TABS: 4 | 10 days supply | Qty: 10 | Fill #0

## 2019-10-24 MED FILL — tiZANidine HCL 4 MG TABS: 4 | 30 days supply | Qty: 30 | Fill #1

## 2019-11-04 ENCOUNTER — Telehealth: Payer: No Typology Code available for payment source | Admitting: Family

## 2019-11-04 DIAGNOSIS — M545 Low back pain, unspecified: Secondary | ICD-10-CM

## 2019-11-04 MED ORDER — BACLOFEN 10 MG PO TABS: 10.0000 mg | ORAL_TABLET | Freq: Three times a day (TID) | ORAL | 0 refills | Status: DC

## 2019-11-04 MED ORDER — PREDNISONE 10 MG (21) PO TBPK: ORAL_TABLET | ORAL | 0 refills | Status: DC

## 2019-11-04 NOTE — Progress Notes (Signed)
We are sorry that you are not feeling well.  Here is how we plan to help!  Based on what you have shared with me it looks like you mostly have acute back pain.  Acute back pain is defined as musculoskeletal pain that can resolve in 1-3 weeks with conservative treatment.  Continue your non-steroid anti-inflammatory (NSAID) as well I have prescribed  Baclofen 10 mg every eight hours as needed which is a muscle relaxer and prednisone dose pack.   Some patients experience stomach irritation or in increased heartburn with anti-inflammatory drugs.  Please keep in mind that muscle relaxer's can cause fatigue and should not be taken while at work or driving.  Back pain is very common.  The pain often gets better over time.  The cause of back pain is usually not dangerous.  Most people can learn to manage their back pain on their own.  Home Care  Stay active.  Start with short walks on flat ground if you can.  Try to walk farther each day.  Do not sit, drive or stand in one place for more than 30 minutes.  Do not stay in bed.  Do not avoid exercise or work.  Activity can help your back heal faster.  Be careful when you bend or lift an object.  Bend at your knees, keep the object close to you, and do not twist.  Sleep on a firm mattress.  Lie on your side, and bend your knees.  If you lie on your back, put a pillow under your knees.  Only take medicines as told by your doctor.  Put ice on the injured area.  Put ice in a plastic bag  Place a towel between your skin and the bag  Leave the ice on for 15-20 minutes, 3-4 times a day for the first 2-3 days. 210 After that, you can switch between ice and heat packs.  Ask your doctor about back exercises or massage.  Avoid feeling anxious or stressed.  Find good ways to deal with stress, such as exercise.  Get Help Right Way If:  Your pain does not go away with rest or medicine.  Your pain does not go away in 1 week.  You have new  problems.  You do not feel well.  The pain spreads into your legs.  You cannot control when you poop (bowel movement) or pee (urinate)  You feel sick to your stomach (nauseous) or throw up (vomit)  You have belly (abdominal) pain.  You feel like you may pass out (faint).  If you develop a fever.  Make Sure you:  Understand these instructions.  Will watch your condition  Will get help right away if you are not doing well or get worse.  Your e-visit answers were reviewed by a board certified advanced clinical practitioner to complete your personal care plan.  Depending on the condition, your plan could have included both over the counter or prescription medications.  If there is a problem please reply  once you have received a response from your provider.  Your safety is important to [place].  If you have drug allergies check your prescription carefully.    You can use MyChart to ask questions about today's visit, request a non-urgent call back, or ask for a work or school excuse for 24 hours related to this e-Visit. If it has been greater than 24 hours you will need to follow up with your provider, or enter a new e-Visit to  address those concerns.  You will get an e-mail in the next two days asking about your experience.  I hope that your e-visit has been valuable and will speed your recovery. Thank you for using e-visits.  Approximately 5 minutes was spent documenting and reviewing patient's chart.

## 2019-11-05 ENCOUNTER — Other Ambulatory Visit: Payer: Self-pay | Admitting: Family Medicine

## 2019-11-05 MED ORDER — OXYCODONE-ACETAMINOPHEN 5-325 MG PO TABS: 1.0000 | ORAL_TABLET | ORAL | 0 refills | Status: AC | PRN

## 2019-11-05 MED FILL — GABAPENTIN 100 MG CAPSULE: 100 | 90 days supply | Qty: 180 | Fill #3

## 2019-11-05 MED FILL — OXYCODONE-APAP 5-325MG: 5-325 | 3 days supply | Qty: 30 | Fill #0

## 2019-11-05 NOTE — Progress Notes (Signed)
Right shoulder pain - flare. Started pred pack. Taking NSAIDs. Will call Ortho for follow up. Pain Rx refill today. Helane Rima, DO

## 2019-11-07 MED FILL — LORazepam 1 MG TABS: 1 | 90 days supply | Qty: 90 | Fill #2

## 2019-11-09 ENCOUNTER — Ambulatory Visit (INDEPENDENT_AMBULATORY_CARE_PROVIDER_SITE_OTHER): Payer: No Typology Code available for payment source | Admitting: Family Medicine

## 2019-11-09 ENCOUNTER — Encounter: Payer: Self-pay | Admitting: Family Medicine

## 2019-11-09 ENCOUNTER — Other Ambulatory Visit: Payer: Self-pay

## 2019-11-09 VITALS — BP 122/84 | HR 93 | Temp 98.0°F | Ht 63.0 in | Wt 168.8 lb

## 2019-11-09 DIAGNOSIS — G43009 Migraine without aura, not intractable, without status migrainosus: Secondary | ICD-10-CM

## 2019-11-09 DIAGNOSIS — F5101 Primary insomnia: Secondary | ICD-10-CM

## 2019-11-09 DIAGNOSIS — F321 Major depressive disorder, single episode, moderate: Secondary | ICD-10-CM | POA: Diagnosis not present

## 2019-11-09 DIAGNOSIS — G43909 Migraine, unspecified, not intractable, without status migrainosus: Secondary | ICD-10-CM | POA: Insufficient documentation

## 2019-11-09 MED ORDER — BUPROPION HCL ER (XL) 300 MG PO TB24: 300.0000 mg | ORAL_TABLET | Freq: Every day | ORAL | 1 refills | Status: DC

## 2019-11-09 MED ORDER — TEMAZEPAM 15 MG PO CAPS: 15.0000 mg | ORAL_CAPSULE | Freq: Every evening | ORAL | 0 refills | Status: DC | PRN

## 2019-11-09 MED FILL — BuPROPion HCL ER (XL) 300 M: 300 | 30 days supply | Qty: 30 | Fill #0

## 2019-11-09 MED FILL — TEMAZEPAM 15 MG CAPSULE: 15 | 30 days supply | Qty: 30 | Fill #0

## 2019-11-09 NOTE — Patient Instructions (Addendum)
Try ashwagandha at night. 300mg  about 30 minutes before bed to help with sleep.   Trial of temazepam for sleep. STOP the ativan. DO NOT take together, but this is better for sleep.   Start to exercise as this helps overall mood/depression/sleep and energy.   Increasing your wellbutrin to 300mg /day  F/u in one month for recheck. Doxy okay if you want!  So good to see you!   Aw

## 2019-11-09 NOTE — Progress Notes (Signed)
Patient: [name] MRN: [id number] DOB: [birth year] PCP: Orma Flaming, MD     Subjective:  Chief Complaint  Patient presents with  . Transitions Of Care    HPI: The patient is a 44 y.o. female who presents today for transition of care. She has past medical history of migraines well controlled on trokendi XR, frozen shoulder, GAD, depression, cervical radiculopathy, insomnia and vitamin D deficiency. She doesn't need any refills of her medication that she is aware of.   Primary insomnia: she is currently on ativan only. She has tried [place] in the past. Previous pcp also put her on xanax. She has tried melatonin as well. She has also tried benadryl with no help. She has not tried trazodone. Does correct sleep hygiene. Not really exercising like she should. Never tried restoril.   Depression: currently on wellbutrin 150mg  XR and prozac 20mg /day. She has had depression for a long time. She has a strong family history of this. She likes her medication is working, but wonders if we need to increase one of her medications. She still doesn't have a lot of motivation to do stuff. It takes a lot for her to get up and do things. She doesn't get excited about stuff very often. She used to get excited and have motivation so this is a change for her. No si/hi.   Frozen shoulder: followed by ortho. Receives injections.  Cervical radiculopathy: followed by sports medicine.   HM: UTD except needs a mmg. Check HIV at next annual, but has had done in the past.   Review of Systems  Constitutional: Negative for chills, fatigue and fever.  HENT: Negative for sinus pressure, sinus pain and sore throat.   Respiratory: Negative for chest tightness, shortness of breath and wheezing.   Cardiovascular: Negative for chest pain, palpitations and leg swelling.  Gastrointestinal: Negative for abdominal pain, nausea and vomiting.  Genitourinary: Negative for frequency, pelvic pain and urgency.  Neurological:  Negative for dizziness, light-headedness and headaches.    Allergies Patient is allergic to codeine and other.  Past Medical History Patient  has a past medical history of Anxiety, Carpal tunnel syndrome, Chronic lower back pain, Chronic tension headaches, Constipation, Dysmenorrhea, Endometrial polyp, Fatigue, Frozen shoulder, GERD (gastroesophageal reflux disease), History of lumbar puncture (08/2016), HPV in female, Low serum vitamin D, Neck pain, chronic, Pelvic pain, Thyroid nodule, Tinnitus, bilateral, TMJ (temporomandibular joint disorder), and White matter abnormality on MRI of brain.  Surgical History Patient  has a past surgical history that includes Wrist surgery (Bilateral); Tonsillectomy; De Quervain's release (Bilateral); Wisdom tooth extraction; Dilatation & curettage/hysteroscopy with myosure (N/A, 10/24/2017); and IUD removal (N/A, 10/24/2017).  Family History Pateint's family history includes ADD / ADHD in her mother.  Social History Patient  reports that she quit smoking about 6 years ago. She has a 18.00 pack-year smoking history. She has never used smokeless tobacco. She reports current alcohol use of about 1.0 - 2.0 standard drinks of alcohol per week. She reports that she does not use drugs.    Objective: Vitals:   11/09/19 1318  BP: 122/84  Pulse: 93  Temp: 98 F (36.7 C)  TempSrc: Temporal  SpO2: 97%  Weight: 168 lb 12.8 oz (76.6 kg)  Height: 5\' 3"  (1.6 m)    Body mass index is 29.9 kg/m.  Physical Exam Vitals reviewed.  Constitutional:      Appearance: Normal appearance. She is normal weight.  HENT:     Head: Normocephalic and atraumatic.  Right Ear: Tympanic membrane, ear canal and external ear normal.     Left Ear: Tympanic membrane, ear canal and external ear normal.     Nose: Nose normal.  Eyes:     Extraocular Movements: Extraocular movements intact.     Pupils: Pupils are equal, round, and reactive to light.  Cardiovascular:     Rate and  Rhythm: Normal rate and regular rhythm.     Heart sounds: Normal heart sounds.  Pulmonary:     Effort: Pulmonary effort is normal.     Breath sounds: Normal breath sounds.  Abdominal:     General: Abdomen is flat. Bowel sounds are normal.     Palpations: Abdomen is soft.  Musculoskeletal:     Cervical back: Normal range of motion and neck supple.  Neurological:     General: No focal deficit present.     Mental Status: She is alert and oriented to person, place, and time.  Psychiatric:        Mood and Affect: Mood normal.        Behavior: Behavior normal.     Comments: No si/hi/ah/vh           Office Visit from 11/09/2019 in [facility]  PHQ-9 Total Score  5      Assessment/plan: 1. Migraine without aura and without status migrainosus, not intractable Well controlled on trokendi Xr. No refills needed at this time.   2. Depression, major, single episode, moderate (HCC) phq 9 score is mild and well controlled, but clinically she still is having little to no motivation and having a hard time doing things. Discussed increasing Wellbutrin vs. prozac and have opted to increase Wellbutrin to 300mg /day. Sent in new refill for her. Will have her f/u in one month for recheck. Recommended she try to start exercising as well.   3. Primary insomnia Failed ambien. Already practicing sleep hygiene. Discussed do not like ativan and xanax is def. Not recommended for sleep. Will do trial of ashwagandha and restoril. Also want her to start exercising. F/u in one month to see how she is doing. Discussed she CAN NOT take ativan and restoril together and to stop the ativan.   -stopping gabapentin, doesn't help.  -needs mmg. She will call to set this up.  -hiv at annual   This visit occurred during the SARS-CoV-2 public health emergency.  Safety protocols were in place, including screening questions prior to the visit, additional usage of staff PPE, and extensive cleaning of  exam room while observing appropriate contact time as indicated for disinfecting solutions.     Return in about 1 month (around 12/07/2019) for med f/u: insomnia/depression. 12/09/2019, MD [place] [facility]   11/09/2019

## 2019-11-16 MED FILL — BLISOVI FE 1/20 1-20 MG-MCG: 1-20 | 84 days supply | Qty: 84 | Fill #0

## 2019-11-19 MED FILL — tiZANidine HCL 4 MG TABS: 4 | 20 days supply | Qty: 20 | Fill #2

## 2019-11-20 ENCOUNTER — Encounter: Payer: Self-pay | Admitting: Family Medicine

## 2019-11-20 NOTE — Telephone Encounter (Signed)
Please Advise

## 2019-11-21 ENCOUNTER — Other Ambulatory Visit: Payer: Self-pay | Admitting: Family Medicine

## 2019-11-21 ENCOUNTER — Encounter: Payer: Self-pay | Admitting: Family Medicine

## 2019-11-21 MED ORDER — HYDROCODONE-ACETAMINOPHEN 5-325 MG PO TABS: 1.0000 | ORAL_TABLET | Freq: Four times a day (QID) | ORAL | 0 refills | Status: DC | PRN

## 2019-11-21 MED FILL — HYDROCODON-APAP 5-325: 5-325 | 7 days supply | Qty: 30 | Fill #0

## 2019-11-26 MED FILL — TROKENDI XR 100 MG CAPSULE: 100 | 90 days supply | Qty: 90 | Fill #2

## 2019-12-06 MED FILL — buPROPion HCL ER (XL) 300 M: 300 | 30 days supply | Qty: 30 | Fill #1

## 2019-12-06 MED FILL — TROKENDI XR 100 MG CAPSULE: 100 | 90 days supply | Qty: 90 | Fill #2

## 2019-12-10 ENCOUNTER — Encounter: Payer: Self-pay | Admitting: Family Medicine

## 2019-12-10 MED ORDER — TEMAZEPAM 30 MG PO CAPS: 30.0000 mg | ORAL_CAPSULE | Freq: Every evening | ORAL | 0 refills | Status: DC | PRN

## 2019-12-10 MED FILL — BACLOFEN 20 MG TABS: 20 | 15 days supply | Qty: 15 | Fill #0

## 2019-12-10 MED FILL — FLUoxetine HCL 20 MG CAPS: 20 | 90 days supply | Qty: 90 | Fill #2

## 2019-12-10 MED FILL — traMADol HCL 50 MG TABS: 50 | 30 days supply | Qty: 30 | Fill #0

## 2019-12-10 MED FILL — TEMAZEPAM 30 MG CAPSULE: 30 | 30 days supply | Qty: 30 | Fill #0

## 2019-12-10 NOTE — Telephone Encounter (Signed)
FYI

## 2019-12-17 MED FILL — ESOMEPRAZOLE MAG DR 40 MG C: 40 | 90 days supply | Qty: 90 | Fill #1

## 2019-12-31 MED FILL — buPROPion HCL ER (XL) 300 M: 300 | 30 days supply | Qty: 30 | Fill #2

## 2020-01-10 ENCOUNTER — Other Ambulatory Visit: Payer: Self-pay | Admitting: Family Medicine

## 2020-01-10 MED FILL — TEMAZEPAM 30 MG CAPSULE: 30 | 30 days supply | Qty: 30 | Fill #0

## 2020-01-10 NOTE — Telephone Encounter (Signed)
Pt is requesting Temazepam 30mg  tab #30 0 refill  LOV: 11/09/19 Next Visit: Not scheduled  Approve?

## 2020-01-29 MED FILL — GABAPENTIN 100 MG CAPSULE: 100 | 90 days supply | Qty: 180 | Fill #0

## 2020-01-30 ENCOUNTER — Encounter: Payer: Self-pay | Admitting: Family Medicine

## 2020-02-04 ENCOUNTER — Other Ambulatory Visit: Payer: Self-pay | Admitting: Family Medicine

## 2020-02-04 MED ORDER — HYDROXYZINE HCL 25 MG PO TABS: 25.0000 mg | ORAL_TABLET | Freq: Three times a day (TID) | ORAL | 1 refills | Status: DC | PRN

## 2020-02-04 MED FILL — hydrOXYzine HCL 25 MG TABS: 25 | 20 days supply | Qty: 60 | Fill #0

## 2020-02-07 ENCOUNTER — Other Ambulatory Visit: Payer: Self-pay | Admitting: Family Medicine

## 2020-02-07 MED FILL — TEMAZEPAM 30 MG CAPSULE: 30 | 30 days supply | Qty: 30 | Fill #0

## 2020-02-07 MED FILL — GABAPENTIN 100 MG CAPSULE: 100 | 90 days supply | Qty: 180 | Fill #0

## 2020-02-07 MED FILL — NAPROXEN 500 MG TABS: 500 | 30 days supply | Qty: 60 | Fill #0

## 2020-02-07 NOTE — Telephone Encounter (Signed)
[place] Database Verified LR: 01-10-2020 Qty: 30  Last office visit: 11-09-2019  Upcoming appointment: No pending appt

## 2020-02-13 MED FILL — traMADol HCL 50 MG TABS: 50 | 30 days supply | Qty: 30 | Fill #0

## 2020-02-14 MED FILL — buPROPion HCL ER (XL) 300 M: 300 | 90 days supply | Qty: 90 | Fill #3

## 2020-02-18 ENCOUNTER — Encounter: Payer: Self-pay | Admitting: Family Medicine

## 2020-02-18 ENCOUNTER — Other Ambulatory Visit: Payer: Self-pay | Admitting: Family Medicine

## 2020-02-18 MED ORDER — HYDROXYZINE HCL 50 MG PO TABS: 25.0000 mg | ORAL_TABLET | Freq: Three times a day (TID) | ORAL | 1 refills | Status: DC | PRN

## 2020-02-20 ENCOUNTER — Encounter: Payer: Self-pay | Admitting: Family Medicine

## 2020-02-20 ENCOUNTER — Other Ambulatory Visit: Payer: Self-pay | Admitting: Family Medicine

## 2020-02-20 MED ORDER — HYDROXYZINE HCL 50 MG PO TABS: 50.0000 mg | ORAL_TABLET | Freq: Three times a day (TID) | ORAL | 1 refills | Status: DC | PRN

## 2020-03-05 MED FILL — FLUoxetine HCL 20 MG CAPS: 20 | 90 days supply | Qty: 90 | Fill #3

## 2020-03-06 ENCOUNTER — Other Ambulatory Visit: Payer: Self-pay | Admitting: Family Medicine

## 2020-03-06 MED FILL — TROKENDI XR 100 MG CAPSULE: 100 | 90 days supply | Qty: 90 | Fill #0

## 2020-03-06 MED FILL — TEMAZEPAM 30 MG CAPSULE: 30 | 30 days supply | Qty: 30 | Fill #0

## 2020-03-06 NOTE — Telephone Encounter (Signed)
Pt requesting Temazepam 30 mg capsule LOV: 11/09/2019 No future Visits scheduled Last refill: 02/07/2020  Approve?

## 2020-03-11 ENCOUNTER — Other Ambulatory Visit: Payer: Self-pay | Admitting: Family Medicine

## 2020-03-11 DIAGNOSIS — K219 Gastro-esophageal reflux disease without esophagitis: Secondary | ICD-10-CM

## 2020-03-11 MED FILL — ESOMEPRAZOLE MAG DR 40 MG C: 40 | 90 days supply | Qty: 90 | Fill #0

## 2020-03-11 MED FILL — traMADol HCL 50 MG TABS: 50 | 30 days supply | Qty: 30 | Fill #0

## 2020-03-21 MED FILL — hydrOXYzine HCL 50 MG TABS: 50 | 30 days supply | Qty: 90 | Fill #1

## 2020-04-07 ENCOUNTER — Other Ambulatory Visit: Payer: Self-pay | Admitting: Family Medicine

## 2020-04-07 MED FILL — TEMAZEPAM 30 MG CAPSULE: 30 | 30 days supply | Qty: 30 | Fill #0

## 2020-04-07 MED FILL — hydrOXYzine HCL 50 MG TABS: 50 | 30 days supply | Qty: 90 | Fill #1

## 2020-04-07 NOTE — Telephone Encounter (Signed)
Last refill: 03/06/20 #30, 0 Last OV: 11/09/19 dx. TOC, insomnia

## 2020-04-09 MED FILL — traMADol HCL 50 MG TABS: 50 | 30 days supply | Qty: 30 | Fill #0

## 2020-04-14 ENCOUNTER — Encounter: Payer: Self-pay | Admitting: Family Medicine

## 2020-04-14 ENCOUNTER — Other Ambulatory Visit: Payer: Self-pay

## 2020-04-14 DIAGNOSIS — M25552 Pain in left hip: Secondary | ICD-10-CM

## 2020-04-14 DIAGNOSIS — F5101 Primary insomnia: Secondary | ICD-10-CM

## 2020-04-14 MED ORDER — TEMAZEPAM 30 MG PO CAPS: ORAL_CAPSULE | ORAL | 0 refills | Status: DC

## 2020-04-14 NOTE — Telephone Encounter (Signed)
Declined px. Just filled on 7/26. Too early for refill.  [name], MD [place] [facility]

## 2020-04-14 NOTE — Telephone Encounter (Signed)
LAST APPOINTMENT DATE: 11/09/2019   NEXT APPOINTMENT DATE: Visit date not found    LAST REFILL: 04/07/2020  QTY: 30 capsules

## 2020-05-05 MED FILL — hydrOXYzine HCL 50 MG TABS: 50 | 60 days supply | Qty: 90 | Fill #0

## 2020-05-05 MED FILL — GABAPENTIN 100 MG CAPSULE: 100 | 90 days supply | Qty: 180 | Fill #1

## 2020-05-07 ENCOUNTER — Other Ambulatory Visit: Payer: Self-pay | Admitting: Family Medicine

## 2020-05-07 ENCOUNTER — Other Ambulatory Visit: Payer: Self-pay

## 2020-05-07 DIAGNOSIS — F5101 Primary insomnia: Secondary | ICD-10-CM

## 2020-05-07 MED FILL — buPROPion HCL ER (XL) 300 M: 300 | 90 days supply | Qty: 90 | Fill #0

## 2020-05-07 MED FILL — TEMAZEPAM 30 MG CAPSULE: 30 | 30 days supply | Qty: 30 | Fill #0

## 2020-05-07 NOTE — Telephone Encounter (Signed)
Pt requesting Temazepam 30mg  Tab LOV: 11/09/2019 No future visits scheduled.  Approve?

## 2020-05-12 ENCOUNTER — Other Ambulatory Visit (INDEPENDENT_AMBULATORY_CARE_PROVIDER_SITE_OTHER): Payer: Self-pay | Admitting: Family Medicine

## 2020-05-12 MED ORDER — PREDNISONE 5 MG PO TABS
ORAL_TABLET | ORAL | 0 refills | Status: DC
Start: 2020-05-12 — End: 2020-11-07

## 2020-05-12 MED ORDER — AMOXICILLIN 875 MG PO TABS: 875.0000 mg | ORAL_TABLET | Freq: Two times a day (BID) | ORAL | 0 refills | Status: DC

## 2020-05-12 MED FILL — predniSONE 5 MG TABS: 5 | 6 days supply | Qty: 21 | Fill #0

## 2020-05-12 MED FILL — AMOXICILLIN 875 MG TABS: 875 | 10 days supply | Qty: 20 | Fill #0

## 2020-06-03 ENCOUNTER — Other Ambulatory Visit: Payer: Self-pay | Admitting: Family Medicine

## 2020-06-03 DIAGNOSIS — F5101 Primary insomnia: Secondary | ICD-10-CM

## 2020-06-03 MED FILL — ESOMEPRAZOLE MAG DR 40 MG C: 40 | 90 days supply | Qty: 90 | Fill #1

## 2020-06-03 MED FILL — TROKENDI XR 100 MG CAPSULE: 100 | 90 days supply | Qty: 90 | Fill #1

## 2020-06-04 MED FILL — TEMAZEPAM 30 MG CAPSULE: 30 | 30 days supply | Qty: 30 | Fill #0

## 2020-06-18 ENCOUNTER — Telehealth: Payer: No Typology Code available for payment source | Admitting: Family

## 2020-06-18 DIAGNOSIS — R110 Nausea: Secondary | ICD-10-CM

## 2020-06-18 MED ORDER — BENZONATATE 100 MG PO CAPS: 100.0000 mg | ORAL_CAPSULE | Freq: Three times a day (TID) | ORAL | 0 refills | Status: DC | PRN

## 2020-06-18 MED ORDER — FLUTICASONE PROPIONATE 50 MCG/ACT NA SUSP: 2.0000 | Freq: Every day | NASAL | 6 refills | Status: DC

## 2020-06-18 MED ORDER — ONDANSETRON HCL 4 MG PO TABS: 4.0000 mg | ORAL_TABLET | Freq: Three times a day (TID) | ORAL | 0 refills | Status: DC | PRN

## 2020-06-18 NOTE — Progress Notes (Signed)

## 2020-06-18 NOTE — Addendum Note (Signed)
Addended by: Jannifer Rodney A on: 06/18/2020 09:35 AM   Modules accepted: Orders

## 2020-06-18 NOTE — Progress Notes (Signed)

## 2020-07-01 MED FILL — hydrOXYzine HCL 50 MG TABS: 50 | 60 days supply | Qty: 90 | Fill #1

## 2020-07-09 ENCOUNTER — Other Ambulatory Visit: Payer: Self-pay | Admitting: Family Medicine

## 2020-07-09 DIAGNOSIS — F5101 Primary insomnia: Secondary | ICD-10-CM

## 2020-07-09 MED FILL — TEMAZEPAM 30 MG CAPSULE: 30 | 30 days supply | Qty: 30 | Fill #0

## 2020-08-06 ENCOUNTER — Other Ambulatory Visit: Payer: Self-pay | Admitting: Family Medicine

## 2020-08-06 DIAGNOSIS — F5101 Primary insomnia: Secondary | ICD-10-CM

## 2020-08-06 MED FILL — buPROPion HCL ER (XL) 300 M: 300 | 90 days supply | Qty: 90 | Fill #1

## 2020-08-06 MED FILL — TEMAZEPAM 30 MG CAPSULE: 30 | 30 days supply | Qty: 30 | Fill #0

## 2020-09-01 ENCOUNTER — Other Ambulatory Visit: Payer: Self-pay

## 2020-09-01 ENCOUNTER — Other Ambulatory Visit: Payer: Self-pay | Admitting: Family Medicine

## 2020-09-01 DIAGNOSIS — F5101 Primary insomnia: Secondary | ICD-10-CM

## 2020-09-01 DIAGNOSIS — K219 Gastro-esophageal reflux disease without esophagitis: Secondary | ICD-10-CM

## 2020-09-01 MED FILL — ESOMEPRAZOLE MAG DR 40 MG C: 40 | 90 days supply | Qty: 90 | Fill #0

## 2020-09-01 NOTE — Telephone Encounter (Signed)
Pt requesting Temazepam 30 mg tab LOV: 11/09/2019 No visits scheduled Pt to follow up on 12/07/2019  Approve?

## 2020-09-03 ENCOUNTER — Other Ambulatory Visit: Payer: Self-pay | Admitting: Family Medicine

## 2020-09-03 MED FILL — TEMAZEPAM 30 MG CAPSULE: 30 | 30 days supply | Qty: 30 | Fill #0

## 2020-09-25 ENCOUNTER — Encounter: Payer: Self-pay | Admitting: Family Medicine

## 2020-09-25 ENCOUNTER — Other Ambulatory Visit: Payer: Self-pay

## 2020-09-25 MED ORDER — TROKENDI XR 100 MG PO CP24: 1.0000 | ORAL_CAPSULE | Freq: Every day | ORAL | 0 refills | Status: DC

## 2020-09-25 MED FILL — TROKENDI XR 100 MG CAPSULE: 100 | 90 days supply | Qty: 90 | Fill #0

## 2020-10-01 ENCOUNTER — Other Ambulatory Visit: Payer: Self-pay | Admitting: Family Medicine

## 2020-10-01 DIAGNOSIS — F5101 Primary insomnia: Secondary | ICD-10-CM

## 2020-10-13 MED FILL — TEMAZEPAM 30 MG CAPSULE: 30 | 30 days supply | Qty: 30 | Fill #0

## 2020-10-29 ENCOUNTER — Telehealth: Payer: No Typology Code available for payment source | Admitting: Physician Assistant

## 2020-10-29 DIAGNOSIS — K529 Noninfective gastroenteritis and colitis, unspecified: Secondary | ICD-10-CM | POA: Diagnosis not present

## 2020-10-29 MED ORDER — ONDANSETRON HCL 4 MG PO TABS: 4.0000 mg | ORAL_TABLET | Freq: Three times a day (TID) | ORAL | 0 refills | Status: DC | PRN

## 2020-10-29 NOTE — Addendum Note (Signed)
Addended by: Karrie Meres on: 10/29/2020 10:35 AM   Modules accepted: Orders

## 2020-10-29 NOTE — Progress Notes (Signed)

## 2020-10-30 ENCOUNTER — Other Ambulatory Visit (INDEPENDENT_AMBULATORY_CARE_PROVIDER_SITE_OTHER): Payer: Self-pay | Admitting: Family Medicine

## 2020-10-30 DIAGNOSIS — R1011 Right upper quadrant pain: Secondary | ICD-10-CM

## 2020-10-31 LAB — CBC WITH DIFFERENTIAL/PLATELET
Basophils Absolute: 0.1 10*3/uL (ref 0.0–0.2)
Basos: 1 %
EOS (ABSOLUTE): 0.1 10*3/uL (ref 0.0–0.4)
Eos: 1 %
Hematocrit: 44.5 % (ref 34.0–46.6)
Hemoglobin: 14.4 g/dL (ref 11.1–15.9)
Immature Grans (Abs): 0.1 10*3/uL (ref 0.0–0.1)
Immature Granulocytes: 1 %
Lymphocytes Absolute: 1.6 10*3/uL (ref 0.7–3.1)
Lymphs: 16 %
MCH: 31.2 pg (ref 26.6–33.0)
MCHC: 32.4 g/dL (ref 31.5–35.7)
MCV: 96 fL (ref 79–97)
Monocytes Absolute: 0.5 10*3/uL (ref 0.1–0.9)
Monocytes: 5 %
Neutrophils Absolute: 8.1 10*3/uL — ABNORMAL HIGH (ref 1.4–7.0)
Neutrophils: 76 %
Platelets: 284 10*3/uL (ref 150–450)
RBC: 4.62 x10E6/uL (ref 3.77–5.28)
RDW: 13.2 % (ref 11.7–15.4)
WBC: 10.4 10*3/uL (ref 3.4–10.8)

## 2020-10-31 LAB — COMPREHENSIVE METABOLIC PANEL
ALT: 5 IU/L (ref 0–32)
AST: 5 IU/L (ref 0–40)
Albumin/Globulin Ratio: 2.1 (ref 1.2–2.2)
Albumin: 4.8 g/dL (ref 3.8–4.8)
Alkaline Phosphatase: 78 IU/L (ref 44–121)
BUN/Creatinine Ratio: 17 (ref 9–23)
BUN: 11 mg/dL (ref 6–24)
Bilirubin Total: <0.2 mg/dL (ref 0.0–1.2)
CO2: 18 mmol/L — ABNORMAL LOW (ref 20–29)
Calcium: 9.4 mg/dL (ref 8.7–10.2)
Chloride: 106 mmol/L (ref 96–106)
Creatinine, Ser: 0.64 mg/dL (ref 0.57–1.00)
GFR calc Af Amer: 125 mL/min/{1.73_m2} (ref >59)
GFR calc non Af Amer: 108 mL/min/{1.73_m2} (ref >59)
Globulin, Total: 2.3 g/dL (ref 1.5–4.5)
Glucose: 96 mg/dL (ref 65–99)
Potassium: 4.3 mmol/L (ref 3.5–5.2)
Sodium: 142 mmol/L (ref 134–144)
Total Protein: 7.1 g/dL (ref 6.0–8.5)

## 2020-10-31 LAB — LIPASE: Lipase: 13 U/L — ABNORMAL LOW (ref 14–72)

## 2020-11-07 ENCOUNTER — Other Ambulatory Visit: Payer: Self-pay

## 2020-11-07 ENCOUNTER — Ambulatory Visit (INDEPENDENT_AMBULATORY_CARE_PROVIDER_SITE_OTHER): Payer: No Typology Code available for payment source | Admitting: Family Medicine

## 2020-11-07 ENCOUNTER — Other Ambulatory Visit: Payer: Self-pay | Admitting: Family Medicine

## 2020-11-07 ENCOUNTER — Encounter: Payer: Self-pay | Admitting: Family Medicine

## 2020-11-07 VITALS — BP 120/78 | HR 78 | Temp 97.8°F | Ht 63.0 in | Wt 167.8 lb

## 2020-11-07 DIAGNOSIS — Z0000 Encounter for general adult medical examination without abnormal findings: Secondary | ICD-10-CM

## 2020-11-07 DIAGNOSIS — Z114 Encounter for screening for human immunodeficiency virus [HIV]: Secondary | ICD-10-CM | POA: Diagnosis not present

## 2020-11-07 DIAGNOSIS — E559 Vitamin D deficiency, unspecified: Secondary | ICD-10-CM

## 2020-11-07 DIAGNOSIS — F411 Generalized anxiety disorder: Secondary | ICD-10-CM

## 2020-11-07 DIAGNOSIS — F5101 Primary insomnia: Secondary | ICD-10-CM

## 2020-11-07 DIAGNOSIS — F321 Major depressive disorder, single episode, moderate: Secondary | ICD-10-CM

## 2020-11-07 DIAGNOSIS — Z1159 Encounter for screening for other viral diseases: Secondary | ICD-10-CM

## 2020-11-07 DIAGNOSIS — Z1211 Encounter for screening for malignant neoplasm of colon: Secondary | ICD-10-CM

## 2020-11-07 DIAGNOSIS — G43109 Migraine with aura, not intractable, without status migrainosus: Secondary | ICD-10-CM

## 2020-11-07 LAB — LIPID PANEL
Cholesterol: 212 mg/dL — ABNORMAL HIGH (ref 0–200)
HDL: 59.4 mg/dL (ref >39.00)
LDL Cholesterol: 124 mg/dL — ABNORMAL HIGH (ref 0–99)
NonHDL: 152.59
Total CHOL/HDL Ratio: 4
Triglycerides: 141 mg/dL (ref 0.0–149.0)
VLDL: 28.2 mg/dL (ref 0.0–40.0)

## 2020-11-07 LAB — VITAMIN D 25 HYDROXY (VIT D DEFICIENCY, FRACTURES): VITD: 37.66 ng/mL (ref 30.00–100.00)

## 2020-11-07 LAB — TSH: TSH: 3.6 u[IU]/mL (ref 0.35–4.50)

## 2020-11-07 MED ORDER — ELETRIPTAN HYDROBROMIDE 20 MG PO TABS: 20.0000 mg | ORAL_TABLET | ORAL | 0 refills | Status: DC | PRN

## 2020-11-07 MED ORDER — TOPIRAMATE ER 50 MG PO CAP24: 1.0000 | ORAL_CAPSULE | Freq: Every day | ORAL | 1 refills | Status: DC

## 2020-11-07 MED FILL — TROKENDI XR 50 MG CAPSULE: 50 | 90 days supply | Qty: 90 | Fill #0

## 2020-11-07 NOTE — Patient Instructions (Addendum)
Start vitamin D3 2000IU/daily. I like vitamin tanked up with covid.   -let's increase your trokendi to 143m, but watch for adverse side effects as this tends to happen the higher the dose. Also sent in relpax for abortive therapy for migraines, but may not work if imitrex didn't work.  Also see if you can find a trigger and try natural stuff below.  1. Limit use of pain relievers to no more than 2 days out of week to prevent risk of rebound or medication-overuse headache. 2.  Keep headache diary 3.  Exercise, hydration, caffeine cessation, sleep hygiene, monitor for and avoid triggers 4.  Consider:  magnesium citrate 408mdaily, riboflavin 40068maily, and coenzyme Q10 100m35mree times daily  FOLLOW UP WITH DR> JERTSON!!! Last pap was abnormal in 07/2018 and needed colposcopy!!!! Need to see her!   Preventive Care [id number] Old, Female Preventive care refers to lifestyle choices and visits with your health care provider that can promote health and wellness. This includes:  A yearly physical exam. This is also called an annual wellness visit.  Regular dental and eye exams.  Immunizations.  Screening for certain conditions.  Healthy lifestyle choices, such as: ? Eating a healthy diet. ? Getting regular exercise. ? Not using drugs or products that contain nicotine and tobacco. ? Limiting alcohol use. What can I expect for my preventive care visit? Physical exam Your health care provider will check your:  Height and weight. These may be used to calculate your BMI (body mass index). BMI is a measurement that tells if you are at a healthy weight.  Heart rate and blood pressure.  Body temperature.  Skin for abnormal spots. Counseling Your health care provider may ask you questions about your:  Past medical problems.  Family's medical history.  Alcohol, tobacco, and drug use.  Emotional well-being.  Home life and relationship well-being.  Sexual activity.  Diet,  exercise, and sleep habits.  Work and work enviStatisticianccess to firearms.  Method of birth control.  Menstrual cycle.  Pregnancy history. What immunizations do I need? Vaccines are usually given at various ages, according to a schedule. Your health care provider will recommend vaccines for you based on your age, medical history, and lifestyle or other factors, such as travel or where you work.   What tests do I need? Blood tests  Lipid and cholesterol levels. These may be checked every 5 years, or more often if you are over 50 y63rs old.  Hepatitis C test.  Hepatitis B test. Screening  Lung cancer screening. You may have this screening every year starting at age 9 i31you have a 30-pack-year history of smoking and currently smoke or have quit within the past 15 years.  Colorectal cancer screening. ? All adults should have this screening starting at age 14 a55 continuing until age 12. 19Your health care provider may recommend screening at age 26 i48you are at increased risk. ? You will have tests every 1-10 years, depending on your results and the type of screening test.  Diabetes screening. ? This is done by checking your blood sugar (glucose) after you have not eaten for a while (fasting). ? You may have this done every 1-3 years.  Mammogram. ? This may be done every 1-2 years. ? Talk with your health care provider about when you should start having regular mammograms. This may depend on whether you have a family history of breast cancer.  BRCA-related cancer screening. This may  be done if you have a family history of breast, ovarian, tubal, or peritoneal cancers.  Pelvic exam and Pap test. ? This may be done every 3 years starting at age 58. ? Starting at age 30, this may be done every 5 years if you have a Pap test in combination with an HPV test. Other tests  STD (sexually transmitted disease) testing, if you are at risk.  Bone density scan. This is done to  screen for osteoporosis. You may have this scan if you are at high risk for osteoporosis. Talk with your health care provider about your test results, treatment options, and if necessary, the need for more tests. Follow these instructions at home: Eating and drinking  Eat a diet that includes fresh fruits and vegetables, whole grains, lean protein, and low-fat dairy products.  Take vitamin and mineral supplements as recommended by your health care provider.  Do not drink alcohol if: ? Your health care provider tells you not to drink. ? You are pregnant, may be pregnant, or are planning to become pregnant.  If you drink alcohol: ? Limit how much you have to 0-1 drink a day. ? Be aware of how much alcohol is in your drink. In the [place] one drink equals one 12 oz bottle of beer (355 mL), one 5 oz glass of wine (148 mL), or one 1 oz glass of hard liquor (44 mL).   Lifestyle  Take daily care of your teeth and gums. Brush your teeth every morning and night with fluoride toothpaste. Floss one time each day.  Stay active. Exercise for at least 30 minutes 5 or more days each week.  Do not use any products that contain nicotine or tobacco, such as cigarettes, e-cigarettes, and chewing tobacco. If you need help quitting, ask your health care provider.  Do not use drugs.  If you are sexually active, practice safe sex. Use a condom or other form of protection to prevent STIs (sexually transmitted infections).  If you do not wish to become pregnant, use a form of birth control. If you plan to become pregnant, see your health care provider for a prepregnancy visit.  If told by your health care provider, take low-dose aspirin daily starting at age 79.  Find healthy ways to cope with stress, such as: ? Meditation, yoga, or listening to music. ? Journaling. ? Talking to a trusted person. ? Spending time with friends and family. Safety  Always wear your seat belt while driving or riding in a  vehicle.  Do not drive: ? If you have been drinking alcohol. Do not ride with someone who has been drinking. ? When you are tired or distracted. ? While texting.  Wear a helmet and other protective equipment during sports activities.  If you have firearms in your house, make sure you follow all gun safety procedures. What's next?  Visit your health care provider once a year for an annual wellness visit.  Ask your health care provider how often you should have your eyes and teeth checked.  Stay up to date on all vaccines. This information is not intended to replace advice given to you by your health care provider. Make sure you discuss any questions you have with your health care provider. Document Revised: 06/03/2020 Document Reviewed: 05/11/2018 Elsevier Patient Education  2021 [employer].

## 2020-11-07 NOTE — Progress Notes (Signed)
Patient: [name] MRN: [id number] DOB: [birth year] PCP: [name], MD     Subjective:  Chief Complaint  Patient presents with  . Annual Exam  . Depression  . Anxiety  . Migraine  . Insomnia    HPI: The patient is a 45 y.o. female who presents today for annual exam. She denies any changes to past medical history. There have been no recent hospitalizations. They are following a well balanced diet and exercise plan. Weight has been stable. She says that she is having more headaches. She thinks that she may need to increase Topiramate.  No family hx of colon or breast cancer in first degree relative, but maternal grandmother passed away from colon cancer at age 31 years.   Migraines Has been on trokendi for a while. She has had 2 migraines in the lat month and she has not had a migraine in a really long time. Her regular headaches are getting worse as well. She has had a daily headache for the past 7 days. Does have some vision changes before she gets migraine. Is on OCP, but has been on these for years.   Depression/anxiety  She is currently on wellbutrin 300mg  daily. She was on prozac, but she weaned herself off of the prozac and is doing well. Her anxiety is well controlled as well. She does not need refills on medication. She is walking daily. She is not in counseling.   Insomnia Currently on restoril. She likes this and it works well. She is sleeping 7-8 hours/night. No side effects or grogginess.   Immunization History  Administered Date(s) Administered  . HPV 9-valent 07/27/2017, 09/26/2017, 01/24/2018  . Hepatitis B, adult 11/11/2013  . Influenza Whole 06/22/2019  . Influenza,inj,Quad PF,6+ Mos 05/26/2017, 05/16/2018  . Influenza,inj,quad, With Preservative 06/14/2019  . Influenza-Unspecified 06/19/2020  . PFIZER(Purple Top)SARS-COV-2 Vaccination 09/28/2019, 06/19/2020, 09/07/2020  . Rabies, IM 05/19/2015, 05/22/2015, 05/26/2015, 06/02/2015  . Tdap 10/21/2016    Colonoscopy: due for this  Mammogram: due for this  Pap smear: abnormal pap smear in 2019. Normal pap smear, +HPV. Never did colposcopy.    Review of Systems  Constitutional: Negative for chills, fatigue and fever.  HENT: Negative for congestion, dental problem, ear pain, hearing loss and trouble swallowing.   Eyes: Negative for visual disturbance.  Respiratory: Negative for cough, chest tightness and shortness of breath.   Cardiovascular: Negative for chest pain, palpitations and leg swelling.  Gastrointestinal: Negative for abdominal pain, blood in stool, diarrhea and nausea.  Endocrine: Negative for cold intolerance, polydipsia, polyphagia and polyuria.  Genitourinary: Negative for dysuria and hematuria.  Musculoskeletal: Negative for arthralgias.  Skin: Negative for rash.  Neurological: Positive for headaches. Negative for dizziness.  Psychiatric/Behavioral: Negative for dysphoric mood and sleep disturbance. The patient is not nervous/anxious.     Allergies Patient is allergic to codeine and other.  Past Medical History Patient  has a past medical history of Anxiety, Carpal tunnel syndrome, Chronic lower back pain, Chronic tension headaches, Constipation, Dysmenorrhea, Endometrial polyp, Fatigue, Frozen shoulder, GERD (gastroesophageal reflux disease), History of lumbar puncture (08/2016), HPV in female, Low serum vitamin D, Neck pain, chronic, Pelvic pain, Thyroid nodule, Tinnitus, bilateral, TMJ (temporomandibular joint disorder), and White matter abnormality on MRI of brain.  Surgical History Patient  has a past surgical history that includes Wrist surgery (Bilateral); Tonsillectomy; De Quervain's release (Bilateral); Wisdom tooth extraction; Dilatation & curettage/hysteroscopy with myosure (N/A, 10/24/2017); and IUD removal (N/A, 10/24/2017).  Family History Pateint's family history includes ADD /  ADHD in her mother.  Social History Patient  reports that she quit smoking  about 7 years ago. She has a 18.00 pack-year smoking history. She has never used smokeless tobacco. She reports current alcohol use of about 1.0 - 2.0 standard drink of alcohol per week. She reports that she does not use drugs.    Objective: Vitals:   11/07/20 1107  BP: 120/78  Pulse: 78  Temp: 97.8 F (36.6 C)  TempSrc: Temporal  SpO2: 99%  Weight: 167 lb 12.8 oz (76.1 kg)  Height: 5\' 3"  (1.6 m)    Body mass index is 29.72 kg/m.  Physical Exam Vitals reviewed.  Constitutional:      Appearance: Normal appearance. She is well-developed and well-nourished.  HENT:     Head: Normocephalic and atraumatic.     Right Ear: Tympanic membrane, ear canal and external ear normal.     Left Ear: Tympanic membrane, ear canal and external ear normal.     Nose: Nose normal.     Mouth/Throat:     Mouth: Oropharynx is clear and moist. Mucous membranes are moist.  Eyes:     Extraocular Movements: Extraocular movements intact and EOM normal.     Conjunctiva/sclera: Conjunctivae normal.     Pupils: Pupils are equal, round, and reactive to light.  Neck:     Thyroid: No thyromegaly.     Vascular: No carotid bruit.  Cardiovascular:     Rate and Rhythm: Normal rate and regular rhythm.     Pulses: Normal pulses and intact distal pulses.     Heart sounds: Normal heart sounds. No murmur heard.   Pulmonary:     Effort: Pulmonary effort is normal.     Breath sounds: Normal breath sounds.  Abdominal:     General: Bowel sounds are normal. There is no distension.     Palpations: Abdomen is soft.     Tenderness: There is no abdominal tenderness.  Musculoskeletal:     Cervical back: Normal range of motion and neck supple.  Lymphadenopathy:     Cervical: No cervical adenopathy.  Skin:    General: Skin is warm and dry.     Capillary Refill: Capillary refill takes less than 2 seconds.     Findings: No rash.  Neurological:     General: No focal deficit present.     Mental Status: She is alert and  oriented to person, place, and time.     Cranial Nerves: No cranial nerve deficit.     Motor: No weakness.     Coordination: Coordination normal.     Deep Tendon Reflexes: Reflexes normal.  Psychiatric:        Mood and Affect: Mood and affect and mood normal.        Behavior: Behavior normal.        Flowsheet Row Office Visit from 11/07/2020 in [facility]  PHQ-9 Total Score 5     GAD 7 : Generalized Anxiety Score 11/07/2020 05/16/2019  Nervous, Anxious, on Edge 2 2  Control/stop worrying 1 1  Worry too much - different things 1 1  Trouble relaxing 1 0  Restless 1 0  Easily annoyed or irritable 0 1  Afraid - awful might happen 0 0  Total GAD 7 Score 6 5  Anxiety Difficulty Somewhat difficult Somewhat difficult     Assessment/plan: 1. Annual physical exam Routine fasting labs today. She just had cbc/cmp done and I reviewed these (normal/normal sugar) . Will do rest of  needed labs. Also discussed need to f/u with pap smear. Had +hpv on last pap in 07/2018 and was supposed to fu with colposcopy and never did this. Recommended she call gyn today! Screening scope ordered as well. Due for mmg and she will set this up. encouraged more exercise into her daily agenda/week. Overall she is doing well.  Patient counseling [x]    Nutrition: Stressed importance of moderation in sodium/caffeine intake, saturated fat and cholesterol, caloric balance, sufficient intake of fresh fruits, vegetables, fiber, calcium, iron, and 1 mg of folate supplement per day (for females capable of pregnancy).  [x]    Stressed the importance of regular exercise.   []    Substance Abuse: Discussed cessation/primary prevention of tobacco, alcohol, or other drug use; driving or other dangerous activities under the influence; availability of treatment for abuse.   [x]    Injury prevention: Discussed safety belts, safety helmets, smoke detector, smoking near bedding or upholstery.   [x]    Sexuality:  Discussed sexually transmitted diseases, partner selection, use of condoms, avoidance of unintended pregnancy  and contraceptive alternatives.  [x]    Dental health: Discussed importance of regular tooth brushing, flossing, and dental visits.  [x]    Health maintenance and immunizations reviewed. Please refer to Health maintenance section.    - Lipid panel - TSH  2. Encounter for screening for HIV  - HIV Antibody (routine testing w rflx)  3. Encounter for hepatitis C screening test for low risk patient  - Hepatitis C antibody  4. Depression, major, single episode, moderate (HCC) phq9 score is mild. Very well controlled on her wellbutrin. Glad she is doing so well even off of the prozac. Continue current medication, no refills needed. F/u in 6 months.   5. Generalized anxiety disorder gad7 score is mild. Rarely uses hydroxyzine and anxiety not made worse by wellbutrin. Continue conservative measures and we will continue to monitor. F/u in 6 months.   6. Primary insomnia Doing very well on restoril. pmp website checked. Refills given.   7. Vitamin D deficiency Encouraged her to be on 2000IU of D3 daily. Will check levels today.  - VITAMIN D 25 Hydroxy (Vit-D Deficiency, Fractures)  8. Screening for colon cancer  - Ambulatory referral to Gastroenterology  9. Migraine with aura and without status migrainosus, not intractable -worsening headaches. Will increase her trokendi, but discussed sometimes increased dose causes increased side effects. Will do 150mg  daily -will trial relpax for abortive therapy. Did not like imitrex, so may not like this.  1. Limit use of pain relievers to no more than 2 days out of week to prevent risk of rebound or medication-overuse headache. 2.  Keep headache diary 3.  Exercise, hydration, caffeine cessation, sleep hygiene, monitor for and avoid triggers 4.  Consider:  magnesium citrate 400mg  daily, riboflavin 400mg  daily, and coenzyme Q10 100mg  three  times daily -discussed may be a good candidate for monoclonal injections. If she fails this, would be option.    This visit occurred during the SARS-CoV-2 public health emergency.  Safety protocols were in place, including screening questions prior to the visit, additional usage of staff PPE, and extensive cleaning of exam room while observing appropriate contact time as indicated for disinfecting solutions.     Return in about 1 year (around 11/07/2021) for annual/anxiety/depression/migraines. , MD [place] [facility]  11/07/2020

## 2020-11-10 LAB — HIV ANTIBODY (ROUTINE TESTING W REFLEX): HIV 1&2 Ab, 4th Generation: NONREACTIVE

## 2020-11-10 LAB — HEPATITIS C ANTIBODY
Hepatitis C Ab: NONREACTIVE
SIGNAL TO CUT-OFF: 0 (ref <1.00)

## 2020-11-11 ENCOUNTER — Other Ambulatory Visit: Payer: Self-pay | Admitting: Family Medicine

## 2020-11-11 DIAGNOSIS — F5101 Primary insomnia: Secondary | ICD-10-CM

## 2020-11-11 MED FILL — TEMAZEPAM 30 MG CAPSULE: 30 | 30 days supply | Qty: 30 | Fill #0

## 2020-11-11 MED FILL — buPROPion HCL ER (XL) 300 M: 300 | 90 days supply | Qty: 90 | Fill #0

## 2020-11-12 ENCOUNTER — Encounter: Payer: Self-pay | Admitting: Family Medicine

## 2020-11-13 ENCOUNTER — Other Ambulatory Visit: Payer: Self-pay | Admitting: Family Medicine

## 2020-11-13 MED ORDER — PROMETHAZINE HCL 12.5 MG PO TABS: 12.5000 mg | ORAL_TABLET | Freq: Three times a day (TID) | ORAL | 0 refills | Status: DC | PRN

## 2020-11-13 MED FILL — PROMETHAZINE 12.5 MG TABLET: 12.5 | 6 days supply | Qty: 20 | Fill #0

## 2020-11-25 ENCOUNTER — Encounter: Payer: Self-pay | Admitting: Family Medicine

## 2020-11-28 ENCOUNTER — Telehealth: Payer: Self-pay

## 2020-11-28 NOTE — Telephone Encounter (Signed)
[employer] Prior Auth for Eletriptan HBR 20 mg tab has been approved. Maximum of 12 refill(s) from 11/27/2020 to 11/26/2021.   I left voicemail to make pt aware.

## 2020-12-01 ENCOUNTER — Other Ambulatory Visit (HOSPITAL_BASED_OUTPATIENT_CLINIC_OR_DEPARTMENT_OTHER): Payer: Self-pay

## 2020-12-15 ENCOUNTER — Other Ambulatory Visit: Payer: Self-pay | Admitting: Family Medicine

## 2020-12-15 ENCOUNTER — Other Ambulatory Visit (HOSPITAL_COMMUNITY): Payer: Self-pay

## 2020-12-15 DIAGNOSIS — F5101 Primary insomnia: Secondary | ICD-10-CM

## 2020-12-15 MED ORDER — TEMAZEPAM 30 MG PO CAPS
ORAL_CAPSULE | ORAL | 0 refills | Status: DC
  Filled 2020-12-15: qty 30, 30d supply, fill #0

## 2020-12-16 ENCOUNTER — Other Ambulatory Visit (HOSPITAL_COMMUNITY): Payer: Self-pay

## 2020-12-16 ENCOUNTER — Other Ambulatory Visit (INDEPENDENT_AMBULATORY_CARE_PROVIDER_SITE_OTHER): Payer: Self-pay | Admitting: Family Medicine

## 2020-12-16 MED ORDER — MELOXICAM 15 MG PO TABS
15.0000 mg | ORAL_TABLET | Freq: Every day | ORAL | 0 refills | Status: DC
  Filled 2020-12-16: qty 90, 90d supply, fill #0

## 2020-12-18 ENCOUNTER — Other Ambulatory Visit (HOSPITAL_COMMUNITY): Payer: Self-pay

## 2020-12-19 ENCOUNTER — Other Ambulatory Visit (HOSPITAL_COMMUNITY): Payer: Self-pay

## 2020-12-20 ENCOUNTER — Other Ambulatory Visit (HOSPITAL_COMMUNITY): Payer: Self-pay

## 2020-12-22 ENCOUNTER — Encounter: Payer: Self-pay | Admitting: Family Medicine

## 2020-12-22 DIAGNOSIS — M25512 Pain in left shoulder: Secondary | ICD-10-CM

## 2020-12-29 ENCOUNTER — Telehealth: Payer: No Typology Code available for payment source | Admitting: Nurse Practitioner

## 2020-12-29 DIAGNOSIS — J0100 Acute maxillary sinusitis, unspecified: Secondary | ICD-10-CM

## 2020-12-29 MED ORDER — AMOXICILLIN-POT CLAVULANATE 875-125 MG PO TABS: 1.0000 | ORAL_TABLET | Freq: Two times a day (BID) | ORAL | 0 refills | Status: DC

## 2020-12-29 NOTE — Progress Notes (Signed)

## 2020-12-30 ENCOUNTER — Other Ambulatory Visit: Payer: Self-pay | Admitting: Family Medicine

## 2020-12-30 ENCOUNTER — Other Ambulatory Visit (HOSPITAL_COMMUNITY): Payer: Self-pay

## 2020-12-30 MED ORDER — TROKENDI XR 100 MG PO CP24
1.0000 | ORAL_CAPSULE | Freq: Every day | ORAL | 0 refills | Status: DC
  Filled 2020-12-30: qty 90, 90d supply, fill #0

## 2020-12-30 MED ORDER — ELETRIPTAN HYDROBROMIDE 20 MG PO TABS
ORAL_TABLET | ORAL | 0 refills | Status: DC
  Filled 2020-12-30: qty 10, 28d supply, fill #0

## 2020-12-31 ENCOUNTER — Other Ambulatory Visit (HOSPITAL_COMMUNITY): Payer: Self-pay

## 2021-01-01 ENCOUNTER — Other Ambulatory Visit (HOSPITAL_COMMUNITY): Payer: Self-pay

## 2021-01-06 ENCOUNTER — Encounter: Payer: Self-pay | Admitting: Family Medicine

## 2021-01-08 ENCOUNTER — Telehealth: Payer: No Typology Code available for payment source | Admitting: Physician Assistant

## 2021-01-08 ENCOUNTER — Telehealth: Payer: Self-pay

## 2021-01-08 DIAGNOSIS — U071 COVID-19: Secondary | ICD-10-CM

## 2021-01-08 MED ORDER — BENZONATATE 100 MG PO CAPS
100.0000 mg | ORAL_CAPSULE | Freq: Three times a day (TID) | ORAL | 0 refills | Status: DC | PRN
Start: 2021-01-08 — End: 2021-01-26

## 2021-01-08 MED ORDER — ALBUTEROL SULFATE HFA 108 (90 BASE) MCG/ACT IN AERS: 2.0000 | INHALATION_SPRAY | Freq: Four times a day (QID) | RESPIRATORY_TRACT | 0 refills | Status: DC | PRN

## 2021-01-08 NOTE — Telephone Encounter (Signed)
Attempted to contact patient in regards to my chart questionnaire and there was no answer, vm was left. Also sent protocol information through mychart.

## 2021-01-08 NOTE — Progress Notes (Signed)
I have spent 5 minutes in review of e-visit questionnaire, review and updating patient chart, medical decision making and response to patient.   Judie Hollick Cody Shadrack Brummitt, PA-C    

## 2021-01-08 NOTE — Progress Notes (Signed)
Message sent to patient requesting further input regarding current symptoms. Awaiting patient response.  

## 2021-01-08 NOTE — Progress Notes (Signed)
E-Visit for Positive Covid Test Result We are sorry you are not feeling well. We are here to help!  You have tested positive for COVID-19, meaning that you were infected with the novel coronavirus and could give the virus to others.  It is vitally important that you stay home so you do not spread it to others.      Please continue isolation at home, for at least 10 days since the start of your symptoms and until you have had 24 hours with no fever (without taking a fever reducer) and with improving of symptoms.  If you have no symptoms but tested positive (or all symptoms resolve after 5 days and you have no fever) you can leave your house but continue to wear a mask around others for an additional 5 days. If you have a fever,continue to stay home until you have had 24 hours of no fever. Most cases improve 5-10 days from onset but we have seen a small number of patients who have gotten worse after the 10 days.  Please be sure to watch for worsening symptoms and remain taking the proper precautions.   Go to the nearest hospital ED for assessment if fever/cough/breathlessness are severe or illness seems like a threat to life.    The following symptoms may appear 2-14 days after exposure: . Fever . Cough . Shortness of breath or difficulty breathing . Chills . Repeated shaking with chills . Muscle pain . Headache . Sore throat . New loss of taste or smell . Fatigue . Congestion or runny nose . Nausea or vomiting . Diarrhea  You have been enrolled in [facility] Monitoring for COVID-19. Daily you will receive a questionnaire within the MyChart website. Our COVID-19 response team will be monitoring your responses daily.  You can use medication such as prescription cough medication called Tessalon Perles 100 mg. You may take 1-2 capsules every 8 hours as needed for cough and  prescription inhaler called Albuterol MDI 90 mcg /actuation 2 puffs every 4 hours as needed for shortness of breath,  wheezing, cough.  I have also sent your information to our COVID treatment team who will review and contact you if you qualify for further interventions.   For the vision, it very well could be a combination of getting used to your new prescription and watering of eyes from allergies. I do recommend that if this continues that you reach back out to your eye doctor for further advice. If you note any acute worsening of this symptom, I would recommend Urgent Care or ER evaluation.   You may also take acetaminophen (Tylenol) as needed for fever.  HOME CARE: . Only take medications as instructed by your medical team. . Drink plenty of fluids and get plenty of rest. . A steam or ultrasonic humidifier can help if you have congestion.   GET HELP RIGHT AWAY IF YOU HAVE EMERGENCY WARNING SIGNS.  Call 911 or proceed to your closest emergency facility if: . You develop worsening high fever. . Trouble breathing . Bluish lips or face . Persistent pain or pressure in the chest . New confusion . Inability to wake or stay awake . You cough up blood. . Your symptoms become more severe . Inability to hold down food or fluids  This list is not all possible symptoms. Contact your medical provider for any symptoms that are severe or concerning to you.    Your e-visit answers were reviewed by a board certified advanced clinical practitioner  to complete your personal care plan.  Depending on the condition, your plan could have included both over the counter or prescription medications.  If there is a problem please reply once you have received a response from your provider.  Your safety is important to [place].  If you have drug allergies check your prescription carefully.    You can use MyChart to ask questions about today's visit, request a non-urgent call back, or ask for a work or school excuse for 24 hours related to this e-Visit. If it has been greater than 24 hours you will need to follow up with your  provider, or enter a new e-Visit to address those concerns. You will get an e-mail in the next two days asking about your experience.  I hope that your e-visit has been valuable and will speed your recovery. Thank you for using e-visits.

## 2021-01-10 ENCOUNTER — Other Ambulatory Visit: Payer: Self-pay | Admitting: Physician Assistant

## 2021-01-10 ENCOUNTER — Telehealth: Payer: Self-pay | Admitting: Physician Assistant

## 2021-01-10 NOTE — Telephone Encounter (Signed)
Called to discuss with patient about COVID-19 symptoms and the use of one of the available treatments for those with mild to moderate Covid symptoms and at a high risk of hospitalization.  Pt appears to qualify for outpatient treatment due to co-morbid conditions and/or a member of an at-risk group in accordance with the FDA Emergency Use Authorization.    Symptom onset: 4/26 based on referral infor Vaccinated: yes Booster? yes Immunocompromised? no Qualifiers: BMI NIH Criteria:4  Unable to reach pt - left vm and mychart message. Appears she is out of window for orals and also infusion by the time we offer it next (Monday)  [name]

## 2021-01-11 ENCOUNTER — Other Ambulatory Visit: Payer: Self-pay | Admitting: Physician Assistant

## 2021-01-11 DIAGNOSIS — Z6829 Body mass index (BMI) 29.0-29.9, adult: Secondary | ICD-10-CM

## 2021-01-11 DIAGNOSIS — U071 COVID-19: Secondary | ICD-10-CM

## 2021-01-11 NOTE — Telephone Encounter (Signed)
Pt left a message on our hotline and I attempted a call back but no answer. She mychart messaged me back that her symptom onset was actually 4/26 so she would still qualify for the infusion Monday if there is availability.   Cline Crock PA-C  [facility]

## 2021-01-11 NOTE — Progress Notes (Signed)
I connected by phone with [name] on 01/11/2021 at 9:29 AM to discuss the potential use of a new treatment for mild to moderate COVID-19 viral infection in non-hospitalized patients.  This patient is a 45 y.o. female that meets the FDA criteria for Emergency Use Authorization of COVID monoclonal antibody bebtelovimab.  Has a (+) direct SARS-CoV-2 viral test result  Has mild or moderate COVID-19   Is NOT hospitalized due to COVID-19  Is within 10 days of symptom onset  Has at least one of the high risk factor(s) for progression to severe COVID-19 and/or hospitalization as defined in EUA.  Specific high risk criteria : BMI > 25   I have spoken and communicated the following to the patient or parent/caregiver regarding COVID monoclonal antibody treatment:  1. FDA has authorized the emergency use for the treatment of mild to moderate COVID-19 in adults and pediatric patients with positive results of direct SARS-CoV-2 viral testing who are 59 years of age and older weighing at least 40 kg, and who are at high risk for progressing to severe COVID-19 and/or hospitalization.  2. The significant known and potential risks and benefits of COVID monoclonal antibody, and the extent to which such potential risks and benefits are unknown.  3. Information on available alternative treatments and the risks and benefits of those alternatives, including clinical trials.  4. Patients treated with COVID monoclonal antibody should continue to self-isolate and use infection control measures (e.g., wear mask, isolate, social distance, avoid sharing personal items, clean and disinfect "high touch" surfaces, and frequent handwashing) according to CDC guidelines.   5. The patient or parent/caregiver has the option to accept or refuse COVID monoclonal antibody treatment.  6. Discussion about the monoclonal antibody infusion does not ensure treatment. The patient will be placed on a list and scheduled according  to risk, symptom onset and availability. A scheduler will reach to the patient to let them know if we can accommodate their infusion or not.  After reviewing this information with the patient, the patient has agreed to receive one of the available covid 19 monoclonal antibodies and will be provided an appropriate fact sheet prior to infusion. Cline Crock, PA-C 01/11/2021 9:29 AM

## 2021-01-12 ENCOUNTER — Other Ambulatory Visit: Payer: Self-pay | Admitting: Family Medicine

## 2021-01-12 ENCOUNTER — Ambulatory Visit (INDEPENDENT_AMBULATORY_CARE_PROVIDER_SITE_OTHER): Payer: No Typology Code available for payment source

## 2021-01-12 ENCOUNTER — Other Ambulatory Visit (HOSPITAL_COMMUNITY): Payer: Self-pay

## 2021-01-12 ENCOUNTER — Other Ambulatory Visit: Payer: Self-pay

## 2021-01-12 ENCOUNTER — Other Ambulatory Visit (INDEPENDENT_AMBULATORY_CARE_PROVIDER_SITE_OTHER): Payer: Self-pay | Admitting: Family Medicine

## 2021-01-12 DIAGNOSIS — Z6829 Body mass index (BMI) 29.0-29.9, adult: Secondary | ICD-10-CM

## 2021-01-12 DIAGNOSIS — U071 COVID-19: Secondary | ICD-10-CM

## 2021-01-12 DIAGNOSIS — F5101 Primary insomnia: Secondary | ICD-10-CM

## 2021-01-12 MED ORDER — FAMOTIDINE IN NACL 20-0.9 MG/50ML-% IV SOLN: 20.0000 mg | Freq: Once | INTRAVENOUS | Status: AC | PRN

## 2021-01-12 MED ORDER — DIPHENHYDRAMINE HCL 50 MG/ML IJ SOLN
50.0000 mg | Freq: Once | INTRAMUSCULAR | Status: AC | PRN
Start: 2021-01-12 — End: 2021-01-12

## 2021-01-12 MED ORDER — SODIUM CHLORIDE 0.9 % IV SOLN
INTRAVENOUS | Status: DC | PRN
Start: 2021-01-12 — End: 2021-01-26

## 2021-01-12 MED ORDER — BEBTELOVIMAB 175 MG/2 ML IV (EUA)
175.0000 mg | Freq: Once | INTRAMUSCULAR | Status: AC
Start: 2021-01-12 — End: 2021-01-12
  Administered 2021-01-12: 175 mg via INTRAVENOUS

## 2021-01-12 MED ORDER — HYDROCOD POLST-CPM POLST ER 10-8 MG/5ML PO SUER: 5.0000 mL | Freq: Two times a day (BID) | ORAL | 0 refills | Status: DC | PRN

## 2021-01-12 MED ORDER — ALBUTEROL SULFATE HFA 108 (90 BASE) MCG/ACT IN AERS: 2.0000 | INHALATION_SPRAY | Freq: Once | RESPIRATORY_TRACT | Status: AC | PRN

## 2021-01-12 MED ORDER — METHYLPREDNISOLONE SODIUM SUCC 125 MG IJ SOLR: 125.0000 mg | Freq: Once | INTRAMUSCULAR | Status: AC | PRN

## 2021-01-12 MED ORDER — TEMAZEPAM 30 MG PO CAPS
ORAL_CAPSULE | ORAL | 0 refills | Status: DC
  Filled 2021-01-12: qty 30, fill #0
  Filled 2021-01-20: qty 30, 30d supply, fill #0

## 2021-01-12 MED ORDER — EPINEPHRINE 0.3 MG/0.3ML IJ SOAJ: 0.3000 mg | Freq: Once | INTRAMUSCULAR | Status: AC | PRN

## 2021-01-12 NOTE — Progress Notes (Signed)
+   COVID. + cough, not improved with OTC cough medication or Tessalon. Rx Tussionex today. Helane Rima, DO

## 2021-01-12 NOTE — Telephone Encounter (Signed)
Rx request 

## 2021-01-12 NOTE — Patient Instructions (Signed)

## 2021-01-12 NOTE — Progress Notes (Signed)
Diagnosis: Covid  Provider:  Chilton Greathouse, MD  Procedure: Infusion  IV Type: Peripheral, IV Location: L Hand  Bebtelovimab, Dose: 175mg   Infusion Start Time: 1420  Infusion Stop Time: 1421  Post Infusion IV Care: Observation period completed and Peripheral IV Discontinued  Discharge: Condition: Good, Destination: Home . AVS provided to patient.   Performed by:  , RN

## 2021-01-20 ENCOUNTER — Other Ambulatory Visit (HOSPITAL_COMMUNITY): Payer: Self-pay

## 2021-01-20 MED FILL — Topiramate Cap ER 24HR 50 MG: ORAL | 90 days supply | Qty: 90 | Fill #0 | Status: AC

## 2021-01-21 ENCOUNTER — Other Ambulatory Visit (HOSPITAL_COMMUNITY): Payer: Self-pay

## 2021-01-26 ENCOUNTER — Other Ambulatory Visit (HOSPITAL_COMMUNITY): Payer: Self-pay

## 2021-01-26 ENCOUNTER — Other Ambulatory Visit (INDEPENDENT_AMBULATORY_CARE_PROVIDER_SITE_OTHER): Payer: Self-pay | Admitting: Family Medicine

## 2021-01-26 MED ORDER — BUDESONIDE-FORMOTEROL FUMARATE 160-4.5 MCG/ACT IN AERO
2.0000 | INHALATION_SPRAY | Freq: Two times a day (BID) | RESPIRATORY_TRACT | 3 refills | Status: DC
  Filled 2021-01-26: qty 10.2, 30d supply, fill #0

## 2021-01-31 ENCOUNTER — Other Ambulatory Visit: Payer: Self-pay | Admitting: Family Medicine

## 2021-02-01 MED ORDER — ELETRIPTAN HYDROBROMIDE 20 MG PO TABS
ORAL_TABLET | ORAL | 1 refills | Status: DC
  Filled 2021-02-01: qty 10, 28d supply, fill #0
  Filled 2021-03-04: qty 10, 28d supply, fill #1

## 2021-02-02 ENCOUNTER — Other Ambulatory Visit (HOSPITAL_COMMUNITY): Payer: Self-pay

## 2021-02-03 ENCOUNTER — Other Ambulatory Visit (HOSPITAL_COMMUNITY): Payer: Self-pay

## 2021-02-04 ENCOUNTER — Other Ambulatory Visit (HOSPITAL_COMMUNITY): Payer: Self-pay

## 2021-02-04 MED FILL — Bupropion HCl Tab ER 24HR 300 MG: ORAL | 90 days supply | Qty: 90 | Fill #0 | Status: AC

## 2021-02-10 ENCOUNTER — Other Ambulatory Visit: Payer: Self-pay | Admitting: Family Medicine

## 2021-02-10 ENCOUNTER — Other Ambulatory Visit (HOSPITAL_COMMUNITY): Payer: Self-pay

## 2021-02-10 DIAGNOSIS — F5101 Primary insomnia: Secondary | ICD-10-CM

## 2021-02-10 MED ORDER — TEMAZEPAM 30 MG PO CAPS: ORAL_CAPSULE | ORAL | 0 refills | Status: DC

## 2021-02-10 MED ORDER — TEMAZEPAM 30 MG PO CAPS
ORAL_CAPSULE | ORAL | 0 refills | Status: DC
Start: 2021-02-10 — End: 2021-02-10

## 2021-02-10 MED ORDER — TEMAZEPAM 30 MG PO CAPS
ORAL_CAPSULE | ORAL | 5 refills | Status: DC
  Filled 2021-02-10: qty 30, fill #0
  Filled 2021-02-18: qty 30, 30d supply, fill #0
  Filled 2021-03-19: qty 30, 30d supply, fill #1
  Filled 2021-04-20: qty 30, 30d supply, fill #2
  Filled 2021-05-19: qty 30, 30d supply, fill #3
  Filled 2021-06-17: qty 30, 30d supply, fill #4
  Filled 2021-07-15: qty 30, 30d supply, fill #5

## 2021-02-10 NOTE — Telephone Encounter (Signed)
Last refill: 01/12/21 #30, 0 Last OV: 11/07/20 dx. Anxiety/depression

## 2021-02-10 NOTE — Addendum Note (Signed)
Addended by: Ardith Dark on: 02/10/2021 03:27 PM   Modules accepted: Orders

## 2021-02-10 NOTE — Addendum Note (Signed)
Addended by: Laddie Aquas A on: 02/10/2021 03:10 PM   Modules accepted: Orders

## 2021-02-13 ENCOUNTER — Telehealth: Payer: No Typology Code available for payment source | Admitting: Emergency Medicine

## 2021-02-13 DIAGNOSIS — M545 Low back pain, unspecified: Secondary | ICD-10-CM | POA: Diagnosis not present

## 2021-02-13 MED ORDER — CYCLOBENZAPRINE HCL 10 MG PO TABS: 10.0000 mg | ORAL_TABLET | Freq: Three times a day (TID) | ORAL | 0 refills | Status: DC | PRN

## 2021-02-13 NOTE — Progress Notes (Signed)

## 2021-02-18 ENCOUNTER — Other Ambulatory Visit (HOSPITAL_COMMUNITY): Payer: Self-pay

## 2021-03-04 ENCOUNTER — Other Ambulatory Visit (INDEPENDENT_AMBULATORY_CARE_PROVIDER_SITE_OTHER): Payer: Self-pay | Admitting: Family Medicine

## 2021-03-04 ENCOUNTER — Other Ambulatory Visit (HOSPITAL_COMMUNITY): Payer: Self-pay

## 2021-03-04 ENCOUNTER — Other Ambulatory Visit: Payer: Self-pay | Admitting: Family Medicine

## 2021-03-04 DIAGNOSIS — K219 Gastro-esophageal reflux disease without esophagitis: Secondary | ICD-10-CM

## 2021-03-04 MED ORDER — ESOMEPRAZOLE MAGNESIUM 40 MG PO CPDR
DELAYED_RELEASE_CAPSULE | ORAL | 1 refills | Status: DC
Start: 2021-03-04 — End: 2021-03-18
  Filled 2021-03-04: qty 90, 90d supply, fill #0

## 2021-03-05 ENCOUNTER — Other Ambulatory Visit (HOSPITAL_COMMUNITY): Payer: Self-pay

## 2021-03-09 ENCOUNTER — Other Ambulatory Visit (HOSPITAL_COMMUNITY): Payer: Self-pay

## 2021-03-09 ENCOUNTER — Other Ambulatory Visit (INDEPENDENT_AMBULATORY_CARE_PROVIDER_SITE_OTHER): Payer: Self-pay | Admitting: Family Medicine

## 2021-03-09 DIAGNOSIS — M62830 Muscle spasm of back: Secondary | ICD-10-CM

## 2021-03-09 MED ORDER — CYCLOBENZAPRINE HCL 10 MG PO TABS: 10.0000 mg | ORAL_TABLET | Freq: Three times a day (TID) | ORAL | 0 refills | Status: DC | PRN

## 2021-03-17 ENCOUNTER — Other Ambulatory Visit (HOSPITAL_COMMUNITY): Payer: Self-pay

## 2021-03-18 ENCOUNTER — Other Ambulatory Visit (HOSPITAL_COMMUNITY): Payer: Self-pay

## 2021-03-18 ENCOUNTER — Other Ambulatory Visit (INDEPENDENT_AMBULATORY_CARE_PROVIDER_SITE_OTHER): Payer: Self-pay | Admitting: Family Medicine

## 2021-03-18 MED ORDER — DEXLANSOPRAZOLE 60 MG PO CPDR
60.0000 mg | DELAYED_RELEASE_CAPSULE | Freq: Every day | ORAL | 2 refills | Status: DC
  Filled 2021-03-18: qty 30, 30d supply, fill #0
  Filled 2021-04-20: qty 30, 30d supply, fill #1

## 2021-03-19 ENCOUNTER — Other Ambulatory Visit (HOSPITAL_COMMUNITY): Payer: Self-pay

## 2021-03-27 ENCOUNTER — Other Ambulatory Visit (HOSPITAL_COMMUNITY): Payer: Self-pay

## 2021-03-27 ENCOUNTER — Other Ambulatory Visit: Payer: Self-pay | Admitting: Family Medicine

## 2021-03-27 ENCOUNTER — Other Ambulatory Visit: Payer: Self-pay

## 2021-03-27 MED ORDER — ELETRIPTAN HYDROBROMIDE 20 MG PO TABS
ORAL_TABLET | ORAL | 1 refills | Status: DC
  Filled 2021-03-27 – 2021-04-20 (×2): qty 10, 30d supply, fill #0

## 2021-04-03 ENCOUNTER — Other Ambulatory Visit (HOSPITAL_COMMUNITY): Payer: Self-pay

## 2021-04-06 ENCOUNTER — Other Ambulatory Visit (INDEPENDENT_AMBULATORY_CARE_PROVIDER_SITE_OTHER): Payer: Self-pay | Admitting: Family Medicine

## 2021-04-06 ENCOUNTER — Other Ambulatory Visit (HOSPITAL_COMMUNITY): Payer: Self-pay

## 2021-04-06 DIAGNOSIS — G8929 Other chronic pain: Secondary | ICD-10-CM

## 2021-04-06 MED ORDER — MELOXICAM 15 MG PO TABS
15.0000 mg | ORAL_TABLET | Freq: Every day | ORAL | 1 refills | Status: DC
  Filled 2021-04-06: qty 90, 90d supply, fill #0
  Filled 2021-07-08: qty 90, 90d supply, fill #1

## 2021-04-06 MED ORDER — GABAPENTIN 100 MG PO CAPS
100.0000 mg | ORAL_CAPSULE | Freq: Every day | ORAL | 3 refills | Status: DC
  Filled 2021-04-06: qty 60, 30d supply, fill #0
  Filled 2021-05-05: qty 60, 30d supply, fill #1

## 2021-04-08 ENCOUNTER — Other Ambulatory Visit (INDEPENDENT_AMBULATORY_CARE_PROVIDER_SITE_OTHER): Payer: Self-pay | Admitting: Family Medicine

## 2021-04-08 DIAGNOSIS — M62830 Muscle spasm of back: Secondary | ICD-10-CM

## 2021-04-09 ENCOUNTER — Other Ambulatory Visit (HOSPITAL_COMMUNITY): Payer: Self-pay

## 2021-04-09 MED ORDER — CYCLOBENZAPRINE HCL 10 MG PO TABS
10.0000 mg | ORAL_TABLET | Freq: Three times a day (TID) | ORAL | 0 refills | Status: DC | PRN
  Filled 2021-04-09: qty 30, 10d supply, fill #0

## 2021-04-13 ENCOUNTER — Other Ambulatory Visit (HOSPITAL_COMMUNITY): Payer: Self-pay

## 2021-04-15 ENCOUNTER — Other Ambulatory Visit (HOSPITAL_COMMUNITY): Payer: Self-pay

## 2021-04-15 ENCOUNTER — Telehealth: Payer: No Typology Code available for payment source | Admitting: Nurse Practitioner

## 2021-04-15 DIAGNOSIS — A09 Infectious gastroenteritis and colitis, unspecified: Secondary | ICD-10-CM

## 2021-04-15 MED ORDER — CIPROFLOXACIN HCL 500 MG PO TABS: 500.0000 mg | ORAL_TABLET | Freq: Two times a day (BID) | ORAL | 0 refills | Status: DC

## 2021-04-15 NOTE — Progress Notes (Signed)
We are sorry that you are not feeling well.  Here is how we plan to help!  Based on what you have shared with me it looks like you have Acute Infectious Diarrhea.  Most cases of acute diarrhea are due to infections with virus and bacteria and are self-limited conditions lasting less than 14 days.  For your symptoms you may take Imodium 2 mg tablets that are over the counter at your local pharmacy. Take two tablet now and then one after each loose stool up to 6 a day.  Antibiotics are not needed for most people with diarrhea.   Optional: I have prescribed Cipro 500 mg twice a day for seven days  HOME CARE We recommend changing your diet to help with your symptoms for the next few days. Drink plenty of fluids that contain water salt and sugar. Sports drinks such as Gatorade may help.  You may try broths, soups, bananas, applesauce, soft breads, mashed potatoes or crackers.  You are considered infectious for as long as the diarrhea continues. Hand washing or use of alcohol based hand sanitizers is recommend. It is best to stay out of work or school until your symptoms stop.   GET HELP RIGHT AWAY If you have dark yellow colored urine or do not pass urine frequently you should drink more fluids.   If your symptoms worsen  If you feel like you are going to pass out (faint) You have a new problem  MAKE SURE YOU  Understand these instructions. Will watch your condition. Will get help right away if you are not doing well or get worse.  Thank you for choosing an e-visit.  Your e-visit answers were reviewed by a board certified advanced clinical practitioner to complete your personal care plan. Depending upon the condition, your plan could have included both over the counter or prescription medications.  Please review your pharmacy choice. Make sure the pharmacy is open so you can pick up prescription now. If there is a problem, you may contact your provider through [employer] and have  the prescription routed to another pharmacy.  Your safety is important to [place]. If you have drug allergies check your prescription carefully.   For the next 24 hours you can use MyChart to ask questions about today's visit, request a non-urgent call back, or ask for a work or school excuse. You will get an email in the next two days asking about your experience. I hope that your e-visit has been valuable and will speed your recovery.  5-10 minutes spent reviewing and documenting in chart.

## 2021-04-20 ENCOUNTER — Other Ambulatory Visit (HOSPITAL_COMMUNITY): Payer: Self-pay

## 2021-04-22 ENCOUNTER — Other Ambulatory Visit (HOSPITAL_COMMUNITY): Payer: Self-pay

## 2021-04-29 ENCOUNTER — Other Ambulatory Visit (INDEPENDENT_AMBULATORY_CARE_PROVIDER_SITE_OTHER): Payer: Self-pay | Admitting: Family Medicine

## 2021-04-29 ENCOUNTER — Other Ambulatory Visit (HOSPITAL_COMMUNITY): Payer: Self-pay

## 2021-04-29 DIAGNOSIS — U071 COVID-19: Secondary | ICD-10-CM

## 2021-04-29 MED ORDER — HYDROCOD POLST-CPM POLST ER 10-8 MG/5ML PO SUER
5.0000 mL | Freq: Two times a day (BID) | ORAL | 0 refills | Status: DC | PRN
Start: 2021-04-29 — End: 2021-05-06
  Filled 2021-04-29: qty 115, 12d supply, fill #0

## 2021-05-05 ENCOUNTER — Other Ambulatory Visit (HOSPITAL_COMMUNITY): Payer: Self-pay

## 2021-05-06 ENCOUNTER — Other Ambulatory Visit (HOSPITAL_COMMUNITY): Payer: Self-pay

## 2021-05-06 ENCOUNTER — Other Ambulatory Visit (INDEPENDENT_AMBULATORY_CARE_PROVIDER_SITE_OTHER): Payer: Self-pay | Admitting: Family Medicine

## 2021-05-06 DIAGNOSIS — K219 Gastro-esophageal reflux disease without esophagitis: Secondary | ICD-10-CM

## 2021-05-06 MED ORDER — ESOMEPRAZOLE MAGNESIUM 40 MG PO CPDR
40.0000 mg | DELAYED_RELEASE_CAPSULE | Freq: Every day | ORAL | 0 refills | Status: DC
  Filled 2021-05-06: qty 90, 90d supply, fill #0

## 2021-05-11 ENCOUNTER — Other Ambulatory Visit (INDEPENDENT_AMBULATORY_CARE_PROVIDER_SITE_OTHER): Payer: Self-pay | Admitting: Family Medicine

## 2021-05-11 ENCOUNTER — Other Ambulatory Visit (HOSPITAL_COMMUNITY): Payer: Self-pay

## 2021-05-11 DIAGNOSIS — M62830 Muscle spasm of back: Secondary | ICD-10-CM

## 2021-05-11 MED ORDER — CYCLOBENZAPRINE HCL 10 MG PO TABS
10.0000 mg | ORAL_TABLET | Freq: Three times a day (TID) | ORAL | 0 refills | Status: DC | PRN
  Filled 2021-05-11: qty 30, 10d supply, fill #0

## 2021-05-11 MED ORDER — TROKENDI XR 100 MG PO CP24
1.0000 | ORAL_CAPSULE | Freq: Every day | ORAL | 3 refills | Status: DC
  Filled 2021-05-11: qty 90, 90d supply, fill #0
  Filled 2021-08-10: qty 90, 90d supply, fill #1
  Filled 2021-10-26: qty 90, 90d supply, fill #2
  Filled 2022-03-15 – 2022-03-18 (×2): qty 90, 90d supply, fill #3

## 2021-05-11 MED ORDER — NORETHIN ACE-ETH ESTRAD-FE 1-20 MG-MCG PO TABS
1.0000 | ORAL_TABLET | Freq: Every day | ORAL | 11 refills | Status: DC
  Filled 2021-05-11: qty 28, 28d supply, fill #0

## 2021-05-11 MED ORDER — BUPROPION HCL ER (XL) 300 MG PO TB24
ORAL_TABLET | Freq: Every day | ORAL | 3 refills | Status: DC
  Filled 2021-05-11: qty 90, 90d supply, fill #0
  Filled 2021-08-10: qty 90, 90d supply, fill #1
  Filled 2021-10-26: qty 90, 90d supply, fill #2
  Filled 2022-03-15: qty 90, 90d supply, fill #3

## 2021-05-11 MED ORDER — GABAPENTIN 300 MG PO CAPS
300.0000 mg | ORAL_CAPSULE | Freq: Three times a day (TID) | ORAL | 3 refills | Status: DC
  Filled 2021-05-11: qty 90, 30d supply, fill #0
  Filled 2021-06-03: qty 90, 30d supply, fill #1
  Filled 2021-06-30: qty 90, 30d supply, fill #2
  Filled 2021-07-28: qty 90, 30d supply, fill #3

## 2021-05-12 ENCOUNTER — Other Ambulatory Visit (HOSPITAL_COMMUNITY): Payer: Self-pay

## 2021-05-13 ENCOUNTER — Other Ambulatory Visit (HOSPITAL_COMMUNITY): Payer: Self-pay

## 2021-05-14 ENCOUNTER — Other Ambulatory Visit (HOSPITAL_COMMUNITY): Payer: Self-pay

## 2021-05-14 ENCOUNTER — Other Ambulatory Visit: Payer: Self-pay | Admitting: Family Medicine

## 2021-05-15 ENCOUNTER — Other Ambulatory Visit (HOSPITAL_COMMUNITY): Payer: Self-pay

## 2021-05-15 ENCOUNTER — Other Ambulatory Visit: Payer: Self-pay | Admitting: Family Medicine

## 2021-05-15 MED ORDER — ELETRIPTAN HYDROBROMIDE 20 MG PO TABS
ORAL_TABLET | ORAL | 1 refills | Status: DC
  Filled 2021-05-15: qty 10, 30d supply, fill #0

## 2021-05-15 NOTE — Telephone Encounter (Signed)
Insurance will no longer cover this, they will cover maxalt or imitrex.

## 2021-05-19 ENCOUNTER — Other Ambulatory Visit (HOSPITAL_COMMUNITY): Payer: Self-pay

## 2021-05-19 MED ORDER — SUMATRIPTAN SUCCINATE 50 MG PO TABS
50.0000 mg | ORAL_TABLET | ORAL | 0 refills | Status: DC | PRN
  Filled 2021-05-19: qty 9, 30d supply, fill #0
  Filled 2021-06-08: qty 9, 30d supply, fill #1

## 2021-05-19 NOTE — Addendum Note (Signed)
Addended by: Ardith Dark on: 05/19/2021 07:55 AM   Modules accepted: Orders

## 2021-05-27 ENCOUNTER — Other Ambulatory Visit (HOSPITAL_COMMUNITY): Payer: Self-pay

## 2021-05-27 ENCOUNTER — Other Ambulatory Visit (INDEPENDENT_AMBULATORY_CARE_PROVIDER_SITE_OTHER): Payer: Self-pay | Admitting: Family Medicine

## 2021-05-27 DIAGNOSIS — M62830 Muscle spasm of back: Secondary | ICD-10-CM

## 2021-05-27 MED ORDER — CYCLOBENZAPRINE HCL 10 MG PO TABS
10.0000 mg | ORAL_TABLET | Freq: Three times a day (TID) | ORAL | 0 refills | Status: DC | PRN
  Filled 2021-05-27: qty 90, 30d supply, fill #0

## 2021-06-03 ENCOUNTER — Other Ambulatory Visit (HOSPITAL_COMMUNITY): Payer: Self-pay

## 2021-06-04 ENCOUNTER — Other Ambulatory Visit (HOSPITAL_COMMUNITY): Payer: Self-pay

## 2021-06-07 ENCOUNTER — Telehealth: Payer: No Typology Code available for payment source | Admitting: Family

## 2021-06-07 DIAGNOSIS — J019 Acute sinusitis, unspecified: Secondary | ICD-10-CM

## 2021-06-07 MED ORDER — AMOXICILLIN-POT CLAVULANATE 875-125 MG PO TABS: 1.0000 | ORAL_TABLET | Freq: Two times a day (BID) | ORAL | 0 refills | Status: DC

## 2021-06-07 NOTE — Progress Notes (Signed)

## 2021-06-08 ENCOUNTER — Other Ambulatory Visit (HOSPITAL_COMMUNITY): Payer: Self-pay

## 2021-06-08 ENCOUNTER — Other Ambulatory Visit: Payer: Self-pay | Admitting: Family Medicine

## 2021-06-08 MED ORDER — SUMATRIPTAN SUCCINATE 50 MG PO TABS
50.0000 mg | ORAL_TABLET | ORAL | 0 refills | Status: DC | PRN
Start: 2021-06-08 — End: 2021-07-28
  Filled 2021-06-08: qty 10, 30d supply, fill #0

## 2021-06-12 ENCOUNTER — Other Ambulatory Visit (HOSPITAL_COMMUNITY): Payer: Self-pay

## 2021-06-17 ENCOUNTER — Other Ambulatory Visit (HOSPITAL_COMMUNITY): Payer: Self-pay

## 2021-06-24 ENCOUNTER — Telehealth: Payer: No Typology Code available for payment source | Admitting: Family

## 2021-06-24 DIAGNOSIS — J069 Acute upper respiratory infection, unspecified: Secondary | ICD-10-CM | POA: Diagnosis not present

## 2021-06-24 MED ORDER — FLUTICASONE PROPIONATE 50 MCG/ACT NA SUSP: 2.0000 | Freq: Every day | NASAL | 6 refills | Status: DC

## 2021-06-24 MED ORDER — BENZONATATE 100 MG PO CAPS: 100.0000 mg | ORAL_CAPSULE | Freq: Three times a day (TID) | ORAL | 0 refills | Status: DC | PRN

## 2021-06-24 NOTE — Progress Notes (Signed)

## 2021-06-30 ENCOUNTER — Other Ambulatory Visit (INDEPENDENT_AMBULATORY_CARE_PROVIDER_SITE_OTHER): Payer: Self-pay | Admitting: Family Medicine

## 2021-06-30 ENCOUNTER — Other Ambulatory Visit (HOSPITAL_COMMUNITY): Payer: Self-pay

## 2021-06-30 DIAGNOSIS — J9801 Acute bronchospasm: Secondary | ICD-10-CM

## 2021-06-30 MED ORDER — PREDNISONE 5 MG PO TABS
ORAL_TABLET | ORAL | 1 refills | Status: DC
  Filled 2021-06-30: qty 21, 6d supply, fill #0
  Filled 2021-07-08: qty 21, 6d supply, fill #1

## 2021-06-30 MED ORDER — HYDROCOD POLST-CPM POLST ER 10-8 MG/5ML PO SUER
5.0000 mL | Freq: Two times a day (BID) | ORAL | 0 refills | Status: DC | PRN
Start: 2021-06-30 — End: 2021-08-13
  Filled 2021-06-30: qty 115, 12d supply, fill #0

## 2021-07-01 ENCOUNTER — Other Ambulatory Visit: Payer: Self-pay | Admitting: Family

## 2021-07-01 NOTE — Progress Notes (Signed)
Approximately 5 minutes was spent documenting and reviewing patient's chart.   

## 2021-07-04 ENCOUNTER — Telehealth: Payer: No Typology Code available for payment source | Admitting: Nurse Practitioner

## 2021-07-04 DIAGNOSIS — H5789 Other specified disorders of eye and adnexa: Secondary | ICD-10-CM

## 2021-07-04 MED ORDER — POLYMYXIN B-TRIMETHOPRIM 10000-0.1 UNIT/ML-% OP SOLN: 1.0000 [drp] | OPHTHALMIC | 0 refills | Status: DC

## 2021-07-04 NOTE — Progress Notes (Signed)

## 2021-07-08 ENCOUNTER — Other Ambulatory Visit (INDEPENDENT_AMBULATORY_CARE_PROVIDER_SITE_OTHER): Payer: Self-pay | Admitting: Family Medicine

## 2021-07-08 ENCOUNTER — Other Ambulatory Visit (HOSPITAL_COMMUNITY): Payer: Self-pay

## 2021-07-08 DIAGNOSIS — M62830 Muscle spasm of back: Secondary | ICD-10-CM

## 2021-07-08 MED ORDER — CYCLOBENZAPRINE HCL 10 MG PO TABS
10.0000 mg | ORAL_TABLET | Freq: Three times a day (TID) | ORAL | 0 refills | Status: DC | PRN
  Filled 2021-07-08: qty 90, 30d supply, fill #0

## 2021-07-15 ENCOUNTER — Other Ambulatory Visit (HOSPITAL_COMMUNITY): Payer: Self-pay

## 2021-07-28 ENCOUNTER — Other Ambulatory Visit (HOSPITAL_COMMUNITY): Payer: Self-pay

## 2021-07-28 ENCOUNTER — Other Ambulatory Visit: Payer: Self-pay | Admitting: Family Medicine

## 2021-07-28 MED ORDER — SUMATRIPTAN SUCCINATE 50 MG PO TABS
50.0000 mg | ORAL_TABLET | ORAL | 0 refills | Status: DC | PRN
  Filled 2021-07-28: qty 10, 30d supply, fill #0

## 2021-07-29 ENCOUNTER — Other Ambulatory Visit (INDEPENDENT_AMBULATORY_CARE_PROVIDER_SITE_OTHER): Payer: Self-pay | Admitting: Family Medicine

## 2021-07-29 ENCOUNTER — Other Ambulatory Visit (HOSPITAL_COMMUNITY): Payer: Self-pay

## 2021-07-29 DIAGNOSIS — K219 Gastro-esophageal reflux disease without esophagitis: Secondary | ICD-10-CM

## 2021-07-29 MED ORDER — ESOMEPRAZOLE MAGNESIUM 40 MG PO CPDR
40.0000 mg | DELAYED_RELEASE_CAPSULE | Freq: Every day | ORAL | 0 refills | Status: DC
  Filled 2021-07-29: qty 90, 90d supply, fill #0

## 2021-08-04 ENCOUNTER — Other Ambulatory Visit (INDEPENDENT_AMBULATORY_CARE_PROVIDER_SITE_OTHER): Payer: Self-pay | Admitting: Family Medicine

## 2021-08-04 ENCOUNTER — Other Ambulatory Visit (HOSPITAL_COMMUNITY): Payer: Self-pay

## 2021-08-04 DIAGNOSIS — M62830 Muscle spasm of back: Secondary | ICD-10-CM

## 2021-08-04 MED ORDER — CYCLOBENZAPRINE HCL 10 MG PO TABS
10.0000 mg | ORAL_TABLET | Freq: Three times a day (TID) | ORAL | 0 refills | Status: DC | PRN
  Filled 2021-08-04: qty 90, 30d supply, fill #0

## 2021-08-10 ENCOUNTER — Other Ambulatory Visit (HOSPITAL_COMMUNITY): Payer: Self-pay

## 2021-08-11 ENCOUNTER — Other Ambulatory Visit (HOSPITAL_COMMUNITY): Payer: Self-pay

## 2021-08-13 ENCOUNTER — Other Ambulatory Visit (HOSPITAL_COMMUNITY): Payer: Self-pay

## 2021-08-13 ENCOUNTER — Other Ambulatory Visit (INDEPENDENT_AMBULATORY_CARE_PROVIDER_SITE_OTHER): Payer: Self-pay | Admitting: Family Medicine

## 2021-08-13 DIAGNOSIS — F5101 Primary insomnia: Secondary | ICD-10-CM

## 2021-08-13 MED ORDER — TEMAZEPAM 30 MG PO CAPS
ORAL_CAPSULE | ORAL | 5 refills | Status: DC
  Filled 2021-08-13: qty 30, 30d supply, fill #0
  Filled 2021-09-10: qty 30, 30d supply, fill #1
  Filled 2021-09-30: qty 30, 30d supply, fill #2
  Filled 2021-10-26 – 2021-10-31 (×2): qty 30, 30d supply, fill #3
  Filled 2021-11-23 – 2021-11-26 (×2): qty 30, 30d supply, fill #4

## 2021-08-25 ENCOUNTER — Other Ambulatory Visit (INDEPENDENT_AMBULATORY_CARE_PROVIDER_SITE_OTHER): Payer: Self-pay | Admitting: Family Medicine

## 2021-08-25 MED ORDER — HYDROCOD POLST-CPM POLST ER 10-8 MG/5ML PO SUER: 5.0000 mL | Freq: Two times a day (BID) | ORAL | 0 refills | Status: DC | PRN

## 2021-08-25 MED ORDER — PREDNISONE 5 MG PO TABS: ORAL_TABLET | ORAL | 0 refills | Status: DC

## 2021-08-25 MED ORDER — AMOXICILLIN-POT CLAVULANATE 875-125 MG PO TABS: 1.0000 | ORAL_TABLET | Freq: Two times a day (BID) | ORAL | 0 refills | Status: DC

## 2021-08-31 ENCOUNTER — Other Ambulatory Visit (INDEPENDENT_AMBULATORY_CARE_PROVIDER_SITE_OTHER): Payer: Self-pay | Admitting: Family Medicine

## 2021-08-31 NOTE — Telephone Encounter (Signed)
Dr.Wallace °

## 2021-09-01 ENCOUNTER — Other Ambulatory Visit (HOSPITAL_COMMUNITY): Payer: Self-pay

## 2021-09-01 MED ORDER — GABAPENTIN 300 MG PO CAPS
300.0000 mg | ORAL_CAPSULE | Freq: Three times a day (TID) | ORAL | 3 refills | Status: DC
  Filled 2021-09-01: qty 90, 30d supply, fill #0
  Filled 2021-09-30: qty 90, 30d supply, fill #1
  Filled 2021-10-26: qty 90, 30d supply, fill #2
  Filled 2021-11-23 – 2021-11-26 (×2): qty 90, 30d supply, fill #3

## 2021-09-03 ENCOUNTER — Other Ambulatory Visit (INDEPENDENT_AMBULATORY_CARE_PROVIDER_SITE_OTHER): Payer: Self-pay

## 2021-09-03 ENCOUNTER — Other Ambulatory Visit (HOSPITAL_COMMUNITY): Payer: Self-pay

## 2021-09-03 DIAGNOSIS — M62830 Muscle spasm of back: Secondary | ICD-10-CM

## 2021-09-03 MED ORDER — CYCLOBENZAPRINE HCL 10 MG PO TABS
10.0000 mg | ORAL_TABLET | Freq: Three times a day (TID) | ORAL | 0 refills | Status: DC | PRN
  Filled 2021-09-03: qty 90, 30d supply, fill #0

## 2021-09-04 ENCOUNTER — Other Ambulatory Visit (HOSPITAL_COMMUNITY): Payer: Self-pay

## 2021-09-04 ENCOUNTER — Other Ambulatory Visit (INDEPENDENT_AMBULATORY_CARE_PROVIDER_SITE_OTHER): Payer: Self-pay | Admitting: Family Medicine

## 2021-09-04 DIAGNOSIS — U071 COVID-19: Secondary | ICD-10-CM

## 2021-09-04 MED ORDER — NIRMATRELVIR/RITONAVIR (PAXLOVID)TABLET
3.0000 | ORAL_TABLET | Freq: Two times a day (BID) | ORAL | 0 refills | Status: AC
  Filled 2021-09-04: qty 30, 5d supply, fill #0

## 2021-09-04 NOTE — Progress Notes (Signed)
Exposure to COVID

## 2021-09-10 ENCOUNTER — Other Ambulatory Visit (HOSPITAL_COMMUNITY): Payer: Self-pay

## 2021-09-30 ENCOUNTER — Other Ambulatory Visit (INDEPENDENT_AMBULATORY_CARE_PROVIDER_SITE_OTHER): Payer: Self-pay | Admitting: Family Medicine

## 2021-09-30 ENCOUNTER — Other Ambulatory Visit (HOSPITAL_COMMUNITY): Payer: Self-pay

## 2021-09-30 DIAGNOSIS — M62830 Muscle spasm of back: Secondary | ICD-10-CM

## 2021-09-30 DIAGNOSIS — G8929 Other chronic pain: Secondary | ICD-10-CM

## 2021-09-30 MED ORDER — CYCLOBENZAPRINE HCL 10 MG PO TABS
10.0000 mg | ORAL_TABLET | Freq: Three times a day (TID) | ORAL | 0 refills | Status: DC | PRN
  Filled 2021-09-30: qty 90, 30d supply, fill #0

## 2021-09-30 MED ORDER — MELOXICAM 15 MG PO TABS
15.0000 mg | ORAL_TABLET | Freq: Every day | ORAL | 1 refills | Status: DC
  Filled 2021-09-30: qty 90, 90d supply, fill #0
  Filled 2022-01-08 – 2022-03-15 (×2): qty 90, 90d supply, fill #1

## 2021-10-05 ENCOUNTER — Other Ambulatory Visit (HOSPITAL_COMMUNITY): Payer: Self-pay

## 2021-10-07 ENCOUNTER — Other Ambulatory Visit (HOSPITAL_COMMUNITY): Payer: Self-pay

## 2021-10-14 ENCOUNTER — Other Ambulatory Visit (HOSPITAL_COMMUNITY): Payer: Self-pay

## 2021-10-14 MED ORDER — MOUNJARO 2.5 MG/0.5ML ~~LOC~~ SOAJ
SUBCUTANEOUS | 2 refills | Status: DC
  Filled 2021-10-14: qty 2, 28d supply, fill #0

## 2021-10-15 ENCOUNTER — Other Ambulatory Visit (HOSPITAL_COMMUNITY): Payer: Self-pay

## 2021-10-20 ENCOUNTER — Other Ambulatory Visit (INDEPENDENT_AMBULATORY_CARE_PROVIDER_SITE_OTHER): Payer: Self-pay

## 2021-10-20 ENCOUNTER — Other Ambulatory Visit (HOSPITAL_COMMUNITY): Payer: Self-pay

## 2021-10-20 DIAGNOSIS — K219 Gastro-esophageal reflux disease without esophagitis: Secondary | ICD-10-CM

## 2021-10-20 MED ORDER — ESOMEPRAZOLE MAGNESIUM 40 MG PO CPDR
40.0000 mg | DELAYED_RELEASE_CAPSULE | Freq: Every day | ORAL | 0 refills | Status: DC
  Filled 2021-10-20: qty 90, 90d supply, fill #0

## 2021-10-22 ENCOUNTER — Other Ambulatory Visit (HOSPITAL_COMMUNITY): Payer: Self-pay

## 2021-10-26 ENCOUNTER — Other Ambulatory Visit (HOSPITAL_COMMUNITY): Payer: Self-pay

## 2021-10-28 ENCOUNTER — Other Ambulatory Visit (INDEPENDENT_AMBULATORY_CARE_PROVIDER_SITE_OTHER): Payer: Self-pay | Admitting: Family Medicine

## 2021-10-28 DIAGNOSIS — M542 Cervicalgia: Secondary | ICD-10-CM

## 2021-10-28 MED ORDER — PREDNISONE 10 MG PO TABS: ORAL_TABLET | ORAL | 1 refills | Status: DC

## 2021-10-28 MED ORDER — OXYCODONE-ACETAMINOPHEN 5-325 MG PO TABS: 1.0000 | ORAL_TABLET | Freq: Three times a day (TID) | ORAL | 0 refills | Status: AC | PRN

## 2021-10-28 NOTE — Progress Notes (Signed)
Patient complains of neck pain, stiffness, with decreased ROM x 1 day. She thinks that she slept in an awkward position. She has a history of joint pain, esp neck and upper extremities. She has tried NSAIDs, ice, heat, flexeril, and stretching without relief. Risk versus benefits of medication reviewed. The patient understands monitoring parameters and red flags.  Helane Rima, DO, MS, FAAFP, DABOM - Family and Bariatric Medicine.

## 2021-10-31 ENCOUNTER — Other Ambulatory Visit (HOSPITAL_COMMUNITY): Payer: Self-pay

## 2021-11-02 ENCOUNTER — Other Ambulatory Visit (INDEPENDENT_AMBULATORY_CARE_PROVIDER_SITE_OTHER): Payer: Self-pay | Admitting: Family Medicine

## 2021-11-02 ENCOUNTER — Other Ambulatory Visit (HOSPITAL_COMMUNITY): Payer: Self-pay

## 2021-11-02 DIAGNOSIS — M62830 Muscle spasm of back: Secondary | ICD-10-CM

## 2021-11-02 MED ORDER — CYCLOBENZAPRINE HCL 10 MG PO TABS
10.0000 mg | ORAL_TABLET | Freq: Three times a day (TID) | ORAL | 0 refills | Status: DC | PRN
  Filled 2021-11-02: qty 90, 30d supply, fill #0

## 2021-11-03 ENCOUNTER — Other Ambulatory Visit (HOSPITAL_COMMUNITY): Payer: Self-pay

## 2021-11-03 ENCOUNTER — Other Ambulatory Visit (INDEPENDENT_AMBULATORY_CARE_PROVIDER_SITE_OTHER): Payer: Self-pay | Admitting: Family Medicine

## 2021-11-03 DIAGNOSIS — R632 Polyphagia: Secondary | ICD-10-CM

## 2021-11-03 MED ORDER — WEGOVY 0.25 MG/0.5ML ~~LOC~~ SOAJ
0.2500 mg | SUBCUTANEOUS | 0 refills | Status: DC
  Filled 2021-11-03: qty 2, 28d supply, fill #0

## 2021-11-04 ENCOUNTER — Other Ambulatory Visit (HOSPITAL_COMMUNITY): Payer: Self-pay

## 2021-11-05 ENCOUNTER — Other Ambulatory Visit (INDEPENDENT_AMBULATORY_CARE_PROVIDER_SITE_OTHER): Payer: Self-pay | Admitting: Family Medicine

## 2021-11-05 ENCOUNTER — Other Ambulatory Visit (HOSPITAL_COMMUNITY): Payer: Self-pay

## 2021-11-05 MED ORDER — ONDANSETRON HCL 4 MG PO TABS
4.0000 mg | ORAL_TABLET | Freq: Three times a day (TID) | ORAL | 0 refills | Status: DC | PRN
  Filled 2021-11-05: qty 20, 7d supply, fill #0

## 2021-11-05 MED ORDER — SCOPOLAMINE 1 MG/3DAYS TD PT72
1.0000 | MEDICATED_PATCH | TRANSDERMAL | 12 refills | Status: DC
  Filled 2021-11-05: qty 10, 30d supply, fill #0
  Filled 2021-11-23 – 2021-11-26 (×2): qty 10, 30d supply, fill #1

## 2021-11-18 ENCOUNTER — Other Ambulatory Visit (HOSPITAL_COMMUNITY): Payer: Self-pay

## 2021-11-18 ENCOUNTER — Other Ambulatory Visit (INDEPENDENT_AMBULATORY_CARE_PROVIDER_SITE_OTHER): Payer: Self-pay

## 2021-11-18 MED ORDER — WEGOVY 0.5 MG/0.5ML ~~LOC~~ SOAJ
0.5000 mg | SUBCUTANEOUS | 0 refills | Status: DC
  Filled 2021-11-18: qty 2, 28d supply, fill #0

## 2021-11-18 NOTE — Telephone Encounter (Signed)
Called requesting a refill on Wegovy, needs next dose ?

## 2021-11-23 ENCOUNTER — Other Ambulatory Visit (HOSPITAL_COMMUNITY): Payer: Self-pay

## 2021-11-25 ENCOUNTER — Other Ambulatory Visit (INDEPENDENT_AMBULATORY_CARE_PROVIDER_SITE_OTHER): Payer: Self-pay | Admitting: Family Medicine

## 2021-11-25 ENCOUNTER — Other Ambulatory Visit (HOSPITAL_COMMUNITY): Payer: Self-pay

## 2021-11-25 DIAGNOSIS — M62830 Muscle spasm of back: Secondary | ICD-10-CM

## 2021-11-25 MED ORDER — CYCLOBENZAPRINE HCL 10 MG PO TABS
10.0000 mg | ORAL_TABLET | Freq: Three times a day (TID) | ORAL | 0 refills | Status: DC | PRN
  Filled 2021-11-25: qty 90, 30d supply, fill #0

## 2021-11-26 ENCOUNTER — Other Ambulatory Visit (HOSPITAL_COMMUNITY): Payer: Self-pay

## 2021-12-02 ENCOUNTER — Other Ambulatory Visit (HOSPITAL_COMMUNITY): Payer: Self-pay

## 2021-12-02 ENCOUNTER — Encounter (INDEPENDENT_AMBULATORY_CARE_PROVIDER_SITE_OTHER): Payer: Self-pay | Admitting: Family Medicine

## 2021-12-02 ENCOUNTER — Other Ambulatory Visit: Payer: Self-pay

## 2021-12-02 ENCOUNTER — Ambulatory Visit (INDEPENDENT_AMBULATORY_CARE_PROVIDER_SITE_OTHER): Payer: No Typology Code available for payment source | Admitting: Family Medicine

## 2021-12-02 VITALS — BP 113/76 | HR 87 | Temp 98.0°F | Ht 64.0 in | Wt 173.0 lb

## 2021-12-02 DIAGNOSIS — Z6830 Body mass index (BMI) 30.0-30.9, adult: Secondary | ICD-10-CM

## 2021-12-02 DIAGNOSIS — M542 Cervicalgia: Secondary | ICD-10-CM | POA: Diagnosis not present

## 2021-12-02 DIAGNOSIS — K219 Gastro-esophageal reflux disease without esophagitis: Secondary | ICD-10-CM

## 2021-12-02 DIAGNOSIS — M7502 Adhesive capsulitis of left shoulder: Secondary | ICD-10-CM

## 2021-12-02 DIAGNOSIS — F5101 Primary insomnia: Secondary | ICD-10-CM

## 2021-12-02 DIAGNOSIS — G8929 Other chronic pain: Secondary | ICD-10-CM

## 2021-12-02 DIAGNOSIS — R632 Polyphagia: Secondary | ICD-10-CM

## 2021-12-02 DIAGNOSIS — Z6829 Body mass index (BMI) 29.0-29.9, adult: Secondary | ICD-10-CM

## 2021-12-02 DIAGNOSIS — G43109 Migraine with aura, not intractable, without status migrainosus: Secondary | ICD-10-CM | POA: Diagnosis not present

## 2021-12-02 DIAGNOSIS — E669 Obesity, unspecified: Secondary | ICD-10-CM

## 2021-12-02 MED ORDER — SUMATRIPTAN SUCCINATE 50 MG PO TABS
50.0000 mg | ORAL_TABLET | ORAL | 2 refills | Status: DC | PRN
  Filled 2021-12-02: qty 10, 28d supply, fill #0
  Filled 2021-12-29: qty 10, 28d supply, fill #1

## 2021-12-02 MED ORDER — LORAZEPAM 1 MG PO TABS
1.0000 mg | ORAL_TABLET | Freq: Every day | ORAL | 0 refills | Status: DC
Start: 2021-12-02 — End: 2023-01-27
  Filled 2021-12-02: qty 90, 90d supply, fill #0

## 2021-12-02 MED ORDER — WEGOVY 1 MG/0.5ML ~~LOC~~ SOAJ
1.0000 mg | SUBCUTANEOUS | 0 refills | Status: DC
  Filled 2021-12-02: qty 6, 84d supply, fill #0

## 2021-12-02 MED ORDER — GABAPENTIN 300 MG PO CAPS
300.0000 mg | ORAL_CAPSULE | Freq: Three times a day (TID) | ORAL | 0 refills | Status: DC
  Filled 2021-12-02 – 2021-12-29 (×3): qty 270, 90d supply, fill #0

## 2021-12-03 ENCOUNTER — Other Ambulatory Visit (HOSPITAL_COMMUNITY): Payer: Self-pay

## 2021-12-07 ENCOUNTER — Other Ambulatory Visit (HOSPITAL_COMMUNITY): Payer: Self-pay

## 2021-12-07 MED ORDER — SCOPOLAMINE 1 MG/3DAYS TD PT72
1.0000 | MEDICATED_PATCH | TRANSDERMAL | 12 refills | Status: DC
  Filled 2021-12-07 – 2021-12-29 (×2): qty 10, 30d supply, fill #0
  Filled 2022-03-15: qty 10, 30d supply, fill #1

## 2021-12-09 NOTE — Progress Notes (Signed)
Chief Complaint:   OBESITY [name] is here to discuss her progress with her obesity treatment plan along with follow-up of her obesity related diagnoses.   Today's weight: 173 lbs Today's date: 12/02/2021 Body mass index is 29.7 kg/m.   Assessment/Plan:   1. Polyphagia Current treatment: Wegovy 0.5 mg subcutaneously weekly. She will continue to focus on protein-rich, low simple carbohydrate foods. We reviewed the importance of hydration, regular exercise for stress reduction, and restorative sleep.  - Refill Semaglutide-Weight Management (WEGOVY) 1 MG/0.5ML SOAJ; Inject 1 mg into the skin once a week.  Dispense: 6 mL; Refill: 0 - Refill scopolamine (TRANSDERM-SCOP) 1 MG/3DAYS; Place 1 patch onto the skin every 3 days as directed.  Dispense: 10 patch; Refill: 12  2. Migraine with aura and without status migrainosus, not intractable Well controlled on Imitrex. We will refill today.  - Refill SUMAtriptan (IMITREX) 50 MG tablet; Take 1 tablet by mouth as needed for migraine. May repeat in 2 hours if headache persists or recurs.  Dispense: 10 tablet; Refill: 2  3. Adhesive capsulitis of left shoulder Followed by Ortho. Hx of right shoulder surgery. Reviewed stretches.   4. Chronic neck pain The current medical regimen is effective;  continue present plan and medications.  - Refill gabapentin (NEURONTIN) 300 MG capsule; Take 1 capsule by mouth 3 times daily.  Dispense: 270 capsule; Refill: 0  5. Chronic GERD The current medical regimen is effective;  continue present plan and medications.  6. Primary insomnia This is poorly controlled.  Plan: Recommend sleep hygiene measures including regular sleep schedule, optimal sleep environment, and relaxing presleep rituals.  Medications commonly used to treat insomnia or sleep difficulties were reviewed. After discussion, patient would like to start below medication. Expectations, risks, and potential side effects reviewed.   - Start  LORazepam (ATIVAN) 1 MG tablet; Take 1 tablet (1 mg total) by mouth at bedtime.  Dispense: 90 tablet; Refill: 0  7. Obesity, current BMI 29.68 Course: [name] is currently in the action stage of change. As such, her goal is to continue with weight loss efforts.   Nutrition goals: She has agreed to practicing portion control and making smarter food choices, such as increasing vegetables and decreasing simple carbohydrates.   Exercise goals: All adults should avoid inactivity. Some physical activity is better than none, and adults who participate in any amount of physical activity gain some health benefits.  Behavioral modification strategies: increasing lean protein intake, decreasing simple carbohydrates, increasing vegetables, and increasing water intake.  [name] has agreed to follow-up with our clinic as neede3d. She was informed of the importance of frequent follow-up visits to maximize her success with intensive lifestyle modifications for her multiple health conditions.   Objective:   Blood pressure 113/76, pulse 87, temperature 98 F (36.7 C), temperature source Oral, height 5\' 4"  (1.626 m), weight 173 lb (78.5 kg), SpO2 97 %. Body mass index is 29.7 kg/m.  General: Cooperative, alert, well developed, in no acute distress. HEENT: Conjunctivae and lids unremarkable. Cardiovascular: Regular rhythm.  Lungs: Normal work of breathing. Neurologic: No focal deficits.   Lab Results  Component Value Date   CREATININE 0.64 10/30/2020   BUN 11 10/30/2020   NA 142 10/30/2020   K 4.3 10/30/2020   CL 106 10/30/2020   CO2 18 (L) 10/30/2020   Lab Results  Component Value Date   ALT 5 10/30/2020   AST 5 10/30/2020   ALKPHOS 78 10/30/2020   BILITOT <0.2 10/30/2020   Lab  Results  Component Value Date   HGBA1C 5.6 05/16/2019   Lab Results  Component Value Date   TSH 3.60 11/07/2020   Lab Results  Component Value Date   CHOL 212 (H) 11/07/2020   HDL 59.40 11/07/2020   LDLCALC 124  (H) 11/07/2020   TRIG 141.0 11/07/2020   CHOLHDL 4 11/07/2020   Lab Results  Component Value Date   VD25OH 37.66 11/07/2020   VD25OH 26.78 (L) 05/16/2019   VD25OH 27.20 (L) 06/20/2018   Lab Results  Component Value Date   WBC 10.4 10/30/2020   HGB 14.4 10/30/2020   HCT 44.5 10/30/2020   MCV 96 10/30/2020   PLT 284 10/30/2020   Attestation Statements:   Reviewed by clinician on day of visit: allergies, medications, problem list, medical history, surgical history, family history, social history, and previous encounter notes.  Carlos Levering Friedenbach, CMA, am acting as Energy manager for [employer], DO.  I have reviewed the above documentation for accuracy and completeness, and I agree with the above. -  Helane Rima, DO, MS, FAAFP, DABOM - Family and Bariatric Medicine.

## 2021-12-10 ENCOUNTER — Other Ambulatory Visit (HOSPITAL_COMMUNITY): Payer: Self-pay

## 2021-12-14 ENCOUNTER — Telehealth: Payer: No Typology Code available for payment source | Admitting: Family Medicine

## 2021-12-14 DIAGNOSIS — B309 Viral conjunctivitis, unspecified: Secondary | ICD-10-CM

## 2021-12-14 MED ORDER — POLYMYXIN B-TRIMETHOPRIM 10000-0.1 UNIT/ML-% OP SOLN: 1.0000 [drp] | OPHTHALMIC | 0 refills | Status: DC

## 2021-12-14 NOTE — Progress Notes (Signed)
E-Visit for Pink Eye ? ? ?We are sorry that you are not feeling well.  Here is how we plan to help! ? ?Based on what you have shared with me it looks like you have conjunctivitis.  Conjunctivitis is a common inflammatory or infectious condition of the eye that is often referred to as "pink eye".  In most cases it is contagious (viral or bacterial). However, not all conjunctivitis requires antibiotics (ex. Allergic).  We have made appropriate suggestions for you based upon your presentation.  ? ?This is most likely viral since you reported sinus pressure. Please take Claritin or an allergy medication daily to help clear the sinus area. I will order some drops to help soothe the eye area. Therefore I think working is ok to do. ? ?I have prescribed Polytrim Ophthalmic drops 1-2 drops 4 times a day times 5 days ? ?Pink eye can be highly contagious.  It is typically spread through direct contact with secretions, or contaminated objects or surfaces that one may have touched.  Strict handwashing is suggested with soap and water is urged.  If not available, use alcohol based had sanitizer.  Avoid unnecessary touching of the eye.  If you wear contact lenses, you will need to refrain from wearing them until you see no white discharge from the eye for at least 24 hours after being on medication.  You should see symptom improvement in 1-2 days after starting the medication regimen.  Call us if symptoms are not improved in 1-2 days. ? ?Home Care: ?Wash your hands often! ?Do not wear your contacts until you complete your treatment plan. ?Avoid sharing towels, bed linen, personal items with a person who has pink eye. ?See attention for anyone in your home with similar symptoms. ? ?Get Help Right Away If: ?Your symptoms do not improve. ?You develop blurred or loss of vision. ?Your symptoms worsen (increased discharge, pain or redness) ? ? ?Thank you for choosing an e-visit. ? ?Your e-visit answers were reviewed by a board certified  advanced clinical practitioner to complete your personal care plan. Depending upon the condition, your plan could have included both over the counter or prescription medications. ? ?Please review your pharmacy choice. Make sure the pharmacy is open so you can pick up prescription now. If there is a problem, you may contact your provider through [employer] and have the prescription routed to another pharmacy.  Your safety is important to [place]. If you have drug allergies check your prescription carefully.  ? ?For the next 24 hours you can use MyChart to ask questions about today's visit, request a non-urgent call back, or ask for a work or school excuse. ?You will get an email in the next two days asking about your experience. I hope that your e-visit has been valuable and will speed your recovery. ? ?I provided 5 minutes of non face-to-face time during this encounter for chart review, medication and order placement, as well as and documentation.  ? ?

## 2021-12-23 ENCOUNTER — Telehealth (INDEPENDENT_AMBULATORY_CARE_PROVIDER_SITE_OTHER): Payer: Self-pay

## 2021-12-23 ENCOUNTER — Other Ambulatory Visit (HOSPITAL_COMMUNITY): Payer: Self-pay

## 2021-12-23 DIAGNOSIS — G8929 Other chronic pain: Secondary | ICD-10-CM

## 2021-12-23 MED ORDER — HYDROCODONE-ACETAMINOPHEN 5-325 MG PO TABS
1.0000 | ORAL_TABLET | Freq: Four times a day (QID) | ORAL | 0 refills | Status: DC | PRN
  Filled 2021-12-23: qty 30, 8d supply, fill #0

## 2021-12-23 NOTE — Addendum Note (Signed)
Addended by: Helane Rima R on: 12/23/2021 03:48 PM   Modules accepted: Orders

## 2021-12-23 NOTE — Telephone Encounter (Signed)
Called and is reporting increased left shoulder pain and would like to know if something can be called in to help. ? ?Please advise ?

## 2021-12-23 NOTE — Telephone Encounter (Signed)
Patient with known history of chronic bilateraly shoulder pain, one is s/p repair. Due to increased work, reports worsening pain in shoulders and neck. She has tried all OTC and symptom treatments previously discussed. Will send in short-term norco.   Risk versus benefits of medication reviewed. The patient understands monitoring parameters and red flags.

## 2021-12-24 ENCOUNTER — Other Ambulatory Visit (HOSPITAL_COMMUNITY): Payer: Self-pay

## 2021-12-26 ENCOUNTER — Telehealth: Payer: No Typology Code available for payment source | Admitting: Family Medicine

## 2021-12-26 DIAGNOSIS — R110 Nausea: Secondary | ICD-10-CM

## 2021-12-26 MED ORDER — PROMETHAZINE HCL 25 MG PO TABS: 25.0000 mg | ORAL_TABLET | Freq: Three times a day (TID) | ORAL | 0 refills | Status: DC | PRN

## 2021-12-26 NOTE — Progress Notes (Signed)
E-Visit for Nausea and Vomiting  ? ?We are sorry that you are not feeling well. Here is how we plan to help! ? ?Based on what you have shared with me it looks like you have a Virus that is irritating your GI tract.  Vomiting is the forceful emptying of a portion of the stomach's content through the mouth.  Although nausea and vomiting can make you feel miserable, it's important to remember that these are not diseases, but rather symptoms of an underlying illness.  When we treat short term symptoms, we always caution that any symptoms that persist should be fully evaluated in a medical office. ? ?I have prescribed a medication that will help alleviate your symptoms and allow you to stay hydrated: ? ?Promethazine 25 mg take 1 tablet twice daily ? ?HOME CARE: ?Drink clear liquids.  This is very important! Dehydration (the lack of fluid) can lead to a serious complication.  Start off with 1 tablespoon every 5 minutes for 8 hours. ?You may begin eating bland foods after 8 hours without vomiting.  Start with saltine crackers, white bread, rice, mashed potatoes, applesauce. ?After 48 hours on a bland diet, you may resume a normal diet. ?Try to go to sleep.  Sleep often empties the stomach and relieves the need to vomit. ? ?GET HELP RIGHT AWAY IF: ? ?Your symptoms do not improve or worsen within 2 days after treatment. ?You have a fever for over 3 days. ?You cannot keep down fluids after trying the medication. ? ?MAKE SURE YOU: ? ?Understand these instructions. ?Will watch your condition. ?Will get help right away if you are not doing well or get worse. ? ?  ?Thank you for choosing an e-visit. ? ?Your e-visit answers were reviewed by a board certified advanced clinical practitioner to complete your personal care plan. Depending upon the condition, your plan could have included both over the counter or prescription medications. ? ?Please review your pharmacy choice. Make sure the pharmacy is open so you can pick up  prescription now. If there is a problem, you may contact your provider through [employer] and have the prescription routed to another pharmacy.  Your safety is important to [place]. If you have drug allergies check your prescription carefully.  ? ?For the next 24 hours you can use MyChart to ask questions about today's visit, request a non-urgent call back, or ask for a work or school excuse. ?You will get an email in the next two days asking about your experience. I hope that your e-visit has been valuable and will speed your recovery.  ? ?I have provided 5 minutes of non face to face time during this encounter for chart review and documentation.   ?

## 2021-12-29 ENCOUNTER — Other Ambulatory Visit (INDEPENDENT_AMBULATORY_CARE_PROVIDER_SITE_OTHER): Payer: Self-pay | Admitting: Family Medicine

## 2021-12-29 ENCOUNTER — Other Ambulatory Visit (HOSPITAL_COMMUNITY): Payer: Self-pay

## 2021-12-29 DIAGNOSIS — M62830 Muscle spasm of back: Secondary | ICD-10-CM

## 2021-12-31 ENCOUNTER — Other Ambulatory Visit (INDEPENDENT_AMBULATORY_CARE_PROVIDER_SITE_OTHER): Payer: Self-pay | Admitting: Family Medicine

## 2021-12-31 DIAGNOSIS — M62830 Muscle spasm of back: Secondary | ICD-10-CM

## 2022-01-01 ENCOUNTER — Other Ambulatory Visit (HOSPITAL_COMMUNITY): Payer: Self-pay

## 2022-01-08 ENCOUNTER — Other Ambulatory Visit (HOSPITAL_COMMUNITY): Payer: Self-pay

## 2022-03-15 ENCOUNTER — Encounter (HOSPITAL_COMMUNITY): Payer: Self-pay | Admitting: Pharmacist

## 2022-03-15 ENCOUNTER — Other Ambulatory Visit (HOSPITAL_COMMUNITY): Payer: Self-pay

## 2022-03-18 ENCOUNTER — Other Ambulatory Visit (HOSPITAL_COMMUNITY): Payer: Self-pay

## 2022-03-19 ENCOUNTER — Other Ambulatory Visit (HOSPITAL_COMMUNITY): Payer: Self-pay

## 2022-03-20 ENCOUNTER — Other Ambulatory Visit (HOSPITAL_COMMUNITY): Payer: Self-pay

## 2022-03-22 ENCOUNTER — Other Ambulatory Visit (HOSPITAL_COMMUNITY): Payer: Self-pay

## 2022-03-24 ENCOUNTER — Telehealth: Payer: Self-pay | Admitting: Physician Assistant

## 2022-03-24 DIAGNOSIS — L2084 Intrinsic (allergic) eczema: Secondary | ICD-10-CM

## 2022-03-24 MED ORDER — TRIAMCINOLONE ACETONIDE 0.1 % EX CREA: 1.0000 | TOPICAL_CREAM | Freq: Two times a day (BID) | CUTANEOUS | 0 refills | Status: DC

## 2022-03-24 NOTE — Progress Notes (Signed)
E-Visit for Eczema ? ?We are sorry that you are not feeling well. Here is how we plan to help! ?Based on what you shared with me it looks like you have eczema (atopic dermatitis).  Although the cause of eczema is not completely understood, genetics appear to play a strong role, and people with a family history of eczema are at increased risk of developing the condition. In most people with eczema, there is a genetic abnormality in the outermost layer of the skin, called the epidermis  ? ?Most people with eczema develop their first symptoms as children, before the age of five. Intense itching of the skin, patches of redness, small bumps, and skin flaking are common. Scratching can further inflame the skin and worsen the itching. The itchiness may be more noticeable at nighttime. ? ?Eczema commonly affects the back of the neck, the elbow creases, and the backs of the knees. Other affected areas may include the face, wrists, and forearms. The skin may become thickened and darkened, or even scarred, from repeated scratching. ?Eliminating factors that aggravate your eczema symptoms can help to control the symptoms. Possible triggers may include: ?? Cold or dry environments ?? Sweating ?? Emotional stress or anxiety ?? Rapid temperature changes ?? Exposure to certain chemicals or cleaning solutions, including soaps and detergents, perfumes and cosmetics, wool or synthetic fibers, dust, sand, and cigarette smoke ?Keeping your skin hydrated ?Emollients -- Emollients are creams and ointments that moisturize the skin and prevent it from drying out. The best emollients for people with eczema are thick creams (such as Eucerin, Cetaphil, and Nutraderm) or ointments (such as petroleum jelly, Aquaphor, and Vaseline), which contain little to no water. Emollients are most effective when applied immediately after bathing. Emollients can be applied twice a day or more often if needed. Lotions contain more water than creams and  ointments and are less effective for moisturizing the skin. ?Bathing -- It is not clear if showers or baths are better for keeping the skin hydrated. Lukewarm baths or showers can hydrate and cool the skin, temporarily relieving itching from eczema. An unscented, mild soap or non-soap cleanser (such as Cetaphil) should be used sparingly. Apply an emollient immediately after bathing or showering to prevent your skin from drying out as a result of water evaporation. Emollient bath additives (products you add to the bath water) have not been found to help relieve symptoms. ?Hot or long baths (more than 10 to 15 minutes) and showers should be avoided since they can dry out the skin. ? ?Based on what you shared with me you may have eczema.  ? ?I have prescribed: and Triamcinalone ointment (or cream). Apply to the effected areas twice per day. ? ?I recommend dilute bleach baths for people with eczema. These baths help to decrease the number of bacteria on the skin that can cause infections or worsen symptoms. To prepare a bleach bath, one-fourth to one-half cup of bleach is placed in a full bathtub (about 40 gallons) of water. Bleach baths are usually taken for 5 to 10 minutes twice per week and should be followed by application of an emollient (listed above). ?I recommend you take Benadryl 25mg - 50mg every 4 hours to control the symptoms (including itching) but if they last over 24 hours it is best that you see an office based provider for follow up. ? ?HOME CARE: ?Take lukewarm showers or baths ?Apply creams and ointments to prevent the skin from drying (Eucerin, Cetaphil, Nutraderm, petroleum jelly, Aquaphor or   Vaseline) - these products contain less water than other lotions and are more effective for moisturizing the skin ?Limit exposure to cold or dry environments, sweating, emotional stress and anxiety, rapid temperature changes and exposure to chemicals and cleaning products, soaps and detergents, perfumes,  cosmetics, wool and synthetic fibers, dust, sand and cigarette- factors which can aggravate eczema symptoms.  ?Use a hydrocortisone cream once or twice a day ?Take an antihistamine like Benadryl for widespread rashes that itch.  The adult dosage of Benadryl is 25-50 mg by mouth 4 times daily. ?Caution: This type of medication may cause sleepiness.  Do not drink alcohol, drive, or operate dangerous machinery while taking antihistamines.  Do not take these medications if you have prostate enlargement.  Read the package instructions thoroughly on all medications that you take.  ?GET HELP RIGHT AWAY IF: ?Symptoms that don't go away after treatment. ?Severe itching that persists. ?You develop a fever. ?Your skin begins to drain. ?You have a sore throat. ?You become short of breath. ? ?MAKE SURE YOU  ? ?Understand these instructions. ?Will watch your condition. ?Will get help right away if you are not doing well or get worse. ? ? ?Thank you for choosing an e-visit. ? ?Your e-visit answers were reviewed by a board certified advanced clinical practitioner to complete your personal care plan. Depending upon the condition, your plan could have included both over the counter or prescription medications. ? ?Please review your pharmacy choice. Make sure the pharmacy is open so you can pick up prescription now. If there is a problem, you may contact your provider through MyChart messaging and have the prescription routed to another pharmacy.  Your safety is important to us. If you have drug allergies check your prescription carefully.  ? ?For the next 24 hours you can use MyChart to ask questions about today's visit, request a non-urgent call back, or ask for a work or school excuse. ?You will get an email in the next two days asking about your experience. I hope that your e-visit has been valuable and will speed your recovery. ? ?I provided 5 minutes of non face-to-face time during this encounter for chart review and  documentation.  ? ?

## 2022-03-29 ENCOUNTER — Other Ambulatory Visit (HOSPITAL_COMMUNITY): Payer: Self-pay

## 2022-04-04 ENCOUNTER — Telehealth: Payer: Self-pay | Admitting: Nurse Practitioner

## 2022-04-04 ENCOUNTER — Other Ambulatory Visit (INDEPENDENT_AMBULATORY_CARE_PROVIDER_SITE_OTHER): Payer: Self-pay

## 2022-04-04 DIAGNOSIS — J019 Acute sinusitis, unspecified: Secondary | ICD-10-CM

## 2022-04-04 DIAGNOSIS — B9689 Other specified bacterial agents as the cause of diseases classified elsewhere: Secondary | ICD-10-CM

## 2022-04-04 MED ORDER — AMOXICILLIN-POT CLAVULANATE 875-125 MG PO TABS: 1.0000 | ORAL_TABLET | Freq: Two times a day (BID) | ORAL | 0 refills | Status: AC

## 2022-04-04 NOTE — Progress Notes (Signed)
I have spent 5 minutes in review of e-visit questionnaire, review and updating patient chart, medical decision making and response to patient.  ° °Anvitha Hutmacher W Hershel Corkery, NP ° °  °

## 2022-04-04 NOTE — Progress Notes (Signed)

## 2022-04-05 ENCOUNTER — Other Ambulatory Visit (HOSPITAL_COMMUNITY): Payer: Self-pay

## 2022-04-14 ENCOUNTER — Other Ambulatory Visit (INDEPENDENT_AMBULATORY_CARE_PROVIDER_SITE_OTHER): Payer: Self-pay | Admitting: Family Medicine

## 2022-04-14 ENCOUNTER — Other Ambulatory Visit (HOSPITAL_COMMUNITY): Payer: Self-pay

## 2022-04-14 DIAGNOSIS — G8929 Other chronic pain: Secondary | ICD-10-CM

## 2022-04-21 ENCOUNTER — Encounter (INDEPENDENT_AMBULATORY_CARE_PROVIDER_SITE_OTHER): Payer: Self-pay

## 2022-04-30 ENCOUNTER — Other Ambulatory Visit (HOSPITAL_COMMUNITY): Payer: Self-pay

## 2022-04-30 ENCOUNTER — Other Ambulatory Visit (INDEPENDENT_AMBULATORY_CARE_PROVIDER_SITE_OTHER): Payer: Self-pay | Admitting: Family Medicine

## 2022-04-30 DIAGNOSIS — G8929 Other chronic pain: Secondary | ICD-10-CM

## 2022-05-08 ENCOUNTER — Other Ambulatory Visit (HOSPITAL_COMMUNITY): Payer: Self-pay

## 2022-05-10 ENCOUNTER — Other Ambulatory Visit (HOSPITAL_COMMUNITY): Payer: Self-pay

## 2022-06-08 NOTE — Progress Notes (Unsigned)
[facility]  PATIENT: [name] DOB: 1976-02-02  REFERRING DOCTOR OR PCP: Helane Rima, DO SOURCE: Patient, notes from [facility] neurology, imaging and lab reports, several MRIs of the brain personally reviewed  _________________________________   HISTORICAL  CHIEF COMPLAINT:  No chief complaint on file.   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   Imaging: MRI of the brain 08/29/2004 shows a subtle right periventricular focus.  MRI of the brain 08/13/2016 shows a T2/flair hyperintense focus in the juxtacortical white matter of the posterior left frontal lobe and 2 small periventricular foci and 2 or 3 punctate foci in the deep white matter.  MRI of the brain 05/05/2017 showed no new lesions.  CSF 04/19/2017 showed 3 oligoclonal bands seen in the CSF not detected in the cervical (criteria was 4)  REVIEW OF SYSTEMS: Constitutional: No fevers, chills, sweats, or change in appetite Eyes: No visual changes, double vision, eye pain Ear, nose and throat: No hearing loss, ear pain, nasal congestion, sore throat Cardiovascular: No chest pain, palpitations Respiratory:  No shortness of breath at rest or with exertion.   No wheezes GastrointestinaI: No nausea, vomiting, diarrhea, abdominal pain, fecal incontinence Genitourinary:  No dysuria, urinary retention or frequency.  No nocturia. Musculoskeletal:  No neck pain, back pain Integumentary: No rash, pruritus, skin lesions Neurological: as above Psychiatric: No depression at this time.  No anxiety Endocrine: No palpitations, diaphoresis, change in appetite, change in weigh or increased thirst Hematologic/Lymphatic:  No anemia, purpura, petechiae. Allergic/Immunologic: No itchy/runny eyes, nasal congestion, recent allergic reactions,  rashes  ALLERGIES: Allergies  Allergen Reactions   Codeine Nausea Only   Other Other (See Comments)    Anxious - couldn't sit still (brompheniramine DM)    HOME MEDICATIONS:  Current Outpatient Medications:    buPROPion (WELLBUTRIN XL) 300 MG 24 hr tablet, Take 1 tablet by mouth daily, Disp: 90 tablet, Rfl: 3   cyclobenzaprine (FLEXERIL) 10 MG tablet, Take 1 tablet by mouth 3 times daily as needed for muscle spasms., Disp: 90 tablet, Rfl: 0   esomeprazole (NEXIUM) 40 MG capsule, Take 1 capsule (40 mg total) by mouth daily., Disp: 90 capsule, Rfl: 0   fluticasone (FLONASE) 50 MCG/ACT nasal spray, Place 2 sprays into both nostrils daily., Disp: 16 g, Rfl: 6   gabapentin (NEURONTIN) 300 MG capsule, Take 1 capsule by mouth 3 times daily., Disp: 270 capsule, Rfl: 0   LORazepam (ATIVAN) 1 MG tablet, Take 1 tablet (1 mg total) by mouth at bedtime., Disp: 90 tablet, Rfl: 0   meloxicam (MOBIC) 15 MG tablet, Take 1 tablet by mouth daily., Disp: 90 tablet, Rfl: 1   norethindrone-ethinyl estradiol-FE (LOESTRIN FE) 1-20 MG-MCG tablet, Take 1 tablet by mouth daily., Disp: 28 tablet, Rfl: 11   ondansetron (ZOFRAN) 4 MG tablet, Take 1 tablet (4 mg total) by mouth every 8 (eight) hours as needed for nausea or vomiting., Disp: 20 tablet, Rfl: 0   scopolamine (TRANSDERM-SCOP) 1 MG/3DAYS, Place 1 patch onto the skin every 3 days as directed., Disp: 10 patch, Rfl: 12   SUMAtriptan (IMITREX) 50 MG tablet, Take 1 tablet by mouth as needed for migraine. May repeat in 2 hours if headache persists or recurs., Disp: 10 tablet, Rfl: 2   Topiramate ER (TROKENDI XR) 100 MG CP24, Take 1 capsule by mouth daily, Disp: 90 capsule, Rfl: 3  triamcinolone cream (KENALOG) 0.1 %, Apply 1 Application topically 2 (two) times daily., Disp: 80 g, Rfl: 0  PAST MEDICAL HISTORY: Past Medical History:  Diagnosis Date   Anxiety    Carpal tunnel syndrome    Chronic lower back pain    Chronic tension headaches    Constipation     Dysmenorrhea    Endometrial polyp    Fatigue    Frozen shoulder    GERD (gastroesophageal reflux disease)    History of lumbar puncture 08/2016   HPV in female    Low serum vitamin D    Neck pain, chronic    Pelvic pain    Thyroid nodule    Tinnitus, bilateral    TMJ (temporomandibular joint disorder)    White matter abnormality on MRI of brain     PAST SURGICAL HISTORY: Past Surgical History:  Procedure Laterality Date   DE QUERVAIN'S RELEASE Bilateral    DILATATION & CURETTAGE/HYSTEROSCOPY WITH MYOSURE N/A 10/24/2017   Procedure: DILATATION & CURETTAGE/HYSTEROSCOPY WITH MYOSURE;  Surgeon: Romualdo Bolk, MD;  Location: [facility];  Service: Gynecology;  Laterality: N/A;   IUD REMOVAL N/A 10/24/2017   Procedure: INTRAUTERINE DEVICE (IUD) REMOVAL;  Surgeon: Romualdo Bolk, MD;  Location: [facility];  Service: Gynecology;  Laterality: N/A;   TONSILLECTOMY     WISDOM TOOTH EXTRACTION     WRIST SURGERY Bilateral    2 ganglion cyst removed    FAMILY HISTORY: Family History  Problem Relation Age of Onset   ADD / ADHD Mother     SOCIAL HISTORY: Social History   Socioeconomic History   Marital status: Divorced    Spouse name: Will   Number of children: 1   Years of education: Not on file   Highest education level: Not on file  Occupational History    Employer: [employer]  Tobacco Use   Smoking status: Former    Packs/day: 18.00    Years: 1.00    Total pack years: 18.00    Types: Cigarettes    Quit date: 12/29/2012    Years since quitting: 9.4   Smokeless tobacco: Never  Vaping Use   Vaping Use: Never used  Substance and Sexual Activity   Alcohol use: Yes    Alcohol/week: 1.0 - 2.0 standard drink of alcohol    Types: 1 - 2 Standard drinks or equivalent per week   Drug use: No   Sexual activity: Yes    Partners: Male    Birth control/protection: Pill  Other Topics Concern   Not on file  Social History Narrative    Not on file   Social Determinants of Health   Financial Resource Strain: Not on file  Food Insecurity: Not on file  Transportation Needs: Not on file  Physical Activity: Not on file  Stress: Not on file  Social Connections: Not on file  Intimate Partner Violence: Not on file       PHYSICAL EXAM  There were no vitals filed for this visit.  There is no height or weight on file to calculate BMI.   General: The patient is well-developed and well-nourished and in no acute distress  HEENT:  Head is [place]/AT.  Sclera are anicteric.  Funduscopic exam shows normal optic discs and retinal vessels.  Neck: No carotid bruits are noted.  The neck is nontender.  Cardiovascular: The heart has a regular rate and rhythm with a normal S1 and S2. There were no murmurs, gallops or  rubs.    Skin: Extremities are without rash or  edema.  Musculoskeletal:  Back is nontender  Neurologic Exam  Mental status: The patient is alert and oriented x 3 at the time of the examination. The patient has apparent normal recent and remote memory, with an apparently normal attention span and concentration ability.   Speech is normal.  Cranial nerves: Extraocular movements are full. Pupils are equal, round, and reactive to light and accomodation.  Visual fields are full.  Facial symmetry is present. There is good facial sensation to soft touch bilaterally.Facial strength is normal.  Trapezius and sternocleidomastoid strength is normal. No dysarthria is noted.  The tongue is midline, and the patient has symmetric elevation of the soft palate. No obvious hearing deficits are noted.  Motor:  Muscle bulk is normal.   Tone is normal. Strength is  5 / 5 in all 4 extremities.   Sensory: Sensory testing is intact to pinprick, soft touch and vibration sensation in all 4 extremities.  Coordination: Cerebellar testing reveals good finger-nose-finger and heel-to-shin bilaterally.  Gait and station: Station is normal.   Gait  is normal. Tandem gait is normal. Romberg is negative.   Reflexes: Deep tendon reflexes are symmetric and normal bilaterally.   Plantar responses are flexor.    DIAGNOSTIC DATA (LABS, IMAGING, TESTING) - I reviewed patient records, labs, notes, testing and imaging myself where available.  Lab Results  Component Value Date   WBC 10.4 10/30/2020   HGB 14.4 10/30/2020   HCT 44.5 10/30/2020   MCV 96 10/30/2020   PLT 284 10/30/2020      Component Value Date/Time   NA 142 10/30/2020 0807   K 4.3 10/30/2020 0807   CL 106 10/30/2020 0807   CO2 18 (L) 10/30/2020 0807   GLUCOSE 96 10/30/2020 0807   GLUCOSE 92 05/16/2019 1558   BUN 11 10/30/2020 0807   CREATININE 0.64 10/30/2020 0807   CALCIUM 9.4 10/30/2020 0807   PROT 7.1 10/30/2020 0807   ALBUMIN 4.8 10/30/2020 0807   AST 5 10/30/2020 0807   ALT 5 10/30/2020 0807   ALKPHOS 78 10/30/2020 0807   BILITOT <0.2 10/30/2020 0807   GFRNONAA 108 10/30/2020 0807   GFRAA 125 10/30/2020 0807   Lab Results  Component Value Date   CHOL 212 (H) 11/07/2020   HDL 59.40 11/07/2020   LDLCALC 124 (H) 11/07/2020   TRIG 141.0 11/07/2020   CHOLHDL 4 11/07/2020   Lab Results  Component Value Date   HGBA1C 5.6 05/16/2019   Lab Results  Component Value Date   VITAMINB12 248 05/16/2019   Lab Results  Component Value Date   TSH 3.60 11/07/2020       ASSESSMENT AND PLAN  ***   Channell Quattrone A. Felecia Shelling, MD, [facility] 0/45/9977, 4:14 PM Certified in Neurology, Clinical Neurophysiology, Sleep Medicine and Neuroimaging  [facility] [address] [place], [place] [address] [phone]

## 2022-06-10 ENCOUNTER — Telehealth: Payer: Self-pay | Admitting: Neurology

## 2022-06-10 ENCOUNTER — Ambulatory Visit (INDEPENDENT_AMBULATORY_CARE_PROVIDER_SITE_OTHER): Payer: No Typology Code available for payment source | Admitting: Neurology

## 2022-06-10 ENCOUNTER — Encounter: Payer: Self-pay | Admitting: Neurology

## 2022-06-10 VITALS — BP 120/69 | HR 82 | Ht 64.0 in | Wt 150.0 lb

## 2022-06-10 DIAGNOSIS — R269 Unspecified abnormalities of gait and mobility: Secondary | ICD-10-CM | POA: Insufficient documentation

## 2022-06-10 DIAGNOSIS — H539 Unspecified visual disturbance: Secondary | ICD-10-CM | POA: Diagnosis not present

## 2022-06-10 DIAGNOSIS — M2550 Pain in unspecified joint: Secondary | ICD-10-CM

## 2022-06-10 DIAGNOSIS — R9082 White matter disease, unspecified: Secondary | ICD-10-CM | POA: Diagnosis not present

## 2022-06-10 DIAGNOSIS — R292 Abnormal reflex: Secondary | ICD-10-CM

## 2022-06-10 NOTE — Telephone Encounter (Signed)
[employer] sent to GI they obtain auth  

## 2022-06-12 LAB — ANTI-MOG, SERUM: MOG Antibody, Cell-based IFA: NEGATIVE

## 2022-06-12 LAB — ANA W/REFLEX: Anti Nuclear Antibody (ANA): NEGATIVE

## 2022-06-12 LAB — ANCA PROFILE
Anti-MPO Antibodies: <0.2 units (ref 0.0–0.9)
Anti-PR3 Antibodies: <0.2 units (ref 0.0–0.9)
Atypical pANCA: <1:20 {titer}
C-ANCA: <1:20 {titer}
P-ANCA: <1:20 {titer}

## 2022-06-12 LAB — C-REACTIVE PROTEIN: CRP: 5 mg/L (ref 0–10)

## 2022-06-12 LAB — SEDIMENTATION RATE: Sed Rate: 3 mm/hr (ref 0–32)

## 2022-06-12 LAB — VITAMIN B12: Vitamin B-12: 350 pg/mL (ref 232–1245)

## 2022-06-17 ENCOUNTER — Encounter: Payer: Self-pay | Admitting: Neurology

## 2022-06-21 ENCOUNTER — Telehealth: Payer: Self-pay | Admitting: Neurology

## 2022-06-21 DIAGNOSIS — R160 Hepatomegaly, not elsewhere classified: Secondary | ICD-10-CM

## 2022-06-21 NOTE — Telephone Encounter (Signed)
[place] radiology called, she has a large liver mass on recent imaging Dr. Felecia Shelling ordered. Could be a hepatic adenoma and maybe [place]? She needs a liver protocol MRI. We don;t treat Liver, so maybe we can refer to gastroenterology for large liver mass. Lajuana Ripple is the physician who called, and she had this done at [facility] place a referral to gastro.I told radiology we don;t really do anything with the liver but she has no pcp so he only had [place] to call. I will place a gastroenterology referral for liver mass.

## 2022-06-23 ENCOUNTER — Ambulatory Visit (INDEPENDENT_AMBULATORY_CARE_PROVIDER_SITE_OTHER): Payer: No Typology Code available for payment source | Admitting: Gastroenterology

## 2022-06-23 ENCOUNTER — Encounter: Payer: Self-pay | Admitting: Gastroenterology

## 2022-06-23 VITALS — BP 114/70 | HR 83 | Ht 64.0 in | Wt 149.2 lb

## 2022-06-23 DIAGNOSIS — R160 Hepatomegaly, not elsewhere classified: Secondary | ICD-10-CM | POA: Diagnosis not present

## 2022-06-23 DIAGNOSIS — K8020 Calculus of gallbladder without cholecystitis without obstruction: Secondary | ICD-10-CM

## 2022-06-23 NOTE — Progress Notes (Signed)
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  Ordered MRI brain and C-spine/T-spine which was incidentally notable for 12 cm mass in the right hepatic lobe with diffuse enhancement aside from central area of T2 signal hyperintensity.  Recommended dedicated MRI liver protocol with and without contrast for evaluation of adenoma, neoplasm.  Appearance less c/w FNH.    She is otherwise without any GI symptoms.  No abdominal pain, change in bowel habits, early satiety, nausea/vomiting, hematochezia, melena.  No known personal or family history of hepatobiliary or pancreatic disease.  Last set of liver enzymes was in 10/2020 and normal.  No previous hepatic imaging for review.  Past Medical History:  Diagnosis Date   Anxiety    Carpal tunnel syndrome    Chronic lower back pain    Chronic tension headaches    Constipation    Dysmenorrhea    Endometrial polyp    Fatigue    Frozen shoulder    GERD (gastroesophageal reflux disease)    History of lumbar puncture 08/2016   HPV in female    Low serum vitamin D    Neck pain, chronic    Pelvic pain    Thyroid nodule    Tinnitus, bilateral    TMJ (temporomandibular joint disorder)    White matter abnormality on MRI of brain      Past Surgical History:  Procedure Laterality Date   DE QUERVAIN'S RELEASE Bilateral    DILATATION & CURETTAGE/HYSTEROSCOPY WITH MYOSURE N/A 10/24/2017   Procedure: DILATATION & CURETTAGE/HYSTEROSCOPY WITH MYOSURE;  Surgeon: Salvadore Dom, MD;  Location: [place];  Service: Gynecology;  Laterality: N/A;   IUD REMOVAL N/A 10/24/2017   Procedure: INTRAUTERINE DEVICE (IUD) REMOVAL;  Surgeon: Salvadore Dom, MD;   Location[facility];  Service: Gynecology;  Laterality: N/A;   TONSILLECTOMY     WISDOM TOOTH EXTRACTION     WRIST SURGERY Bilateral    2 ganglion cyst removed   Family History  Problem Relation Age of Onset   ADD / ADHD Mother    Social History   Tobacco Use   Smoking status: Former    Packs/day: 18.00    Years: 1.00    Total pack years: 18.00    Types: Cigarettes    Quit date: 12/29/2012    Years since quitting: 9.4   Smokeless tobacco: Never  Vaping Use   Vaping Use: Never used  Substance Use Topics   Alcohol use: Yes    Alcohol/week: 1.0 - 2.0 standard drink of alcohol    Types: 1 - 2 Standard drinks or equivalent per week   Drug use: No   Current Outpatient Medications  Medication Sig Dispense Refill   buPROPion (WELLBUTRIN XL) 300 MG 24 hr tablet Take 1 tablet by mouth daily 90 tablet 3   cyclobenzaprine (FLEXERIL) 10 MG tablet Take 1 tablet by mouth 3 times daily as needed for muscle spasms. 90 tablet 0   esomeprazole (NEXIUM) 40 MG capsule Take 1 capsule (40 mg total) by mouth daily. 90 capsule 0   fluticasone (FLONASE) 50 MCG/ACT nasal  spray Place 2 sprays into both nostrils daily. 16 g 6   gabapentin (NEURONTIN) 300 MG capsule Take 1 capsule by mouth 3 times daily. 270 capsule 0   LORazepam (ATIVAN) 1 MG tablet Take 1 tablet (1 mg total) by mouth at bedtime. 90 tablet 0   meloxicam (MOBIC) 15 MG tablet Take 1 tablet by mouth daily. 90 tablet 1   ondansetron (ZOFRAN) 4 MG tablet Take 1 tablet (4 mg total) by mouth every 8 (eight) hours as needed for nausea or vomiting. 20 tablet 0   scopolamine (TRANSDERM-SCOP) 1 MG/3DAYS Place 1 patch onto the skin every 3 days as directed. 10 patch 12   SUMAtriptan (IMITREX) 50 MG tablet Take 1 tablet by mouth as needed for migraine. May repeat in 2 hours if headache persists or recurs. 10 tablet 2   Topiramate ER (TROKENDI XR) 100 MG CP24 Take 1 capsule by mouth daily 90 capsule 3   triamcinolone cream (KENALOG)  0.1 % Apply 1 Application topically 2 (two) times daily. 80 g 0   No current facility-administered medications for this visit.   Allergies  Allergen Reactions   Codeine Nausea Only   Other Other (See Comments)    Anxious - couldn't sit still (brompheniramine DM)     Review of Systems: All systems reviewed and negative except where noted in HPI.     Physical Exam:    Wt Readings from Last 3 Encounters:  06/10/22 150 lb (68 kg)  12/02/21 173 lb (78.5 kg)  11/07/20 167 lb 12.8 oz (76.1 kg)    There were no vitals taken for this visit. Constitutional:  Pleasant, in no acute distress. Psychiatric: Normal mood and affect. Behavior is normal. Cardiovascular: Normal rate, regular rhythm. No edema Pulmonary/chest: Effort normal and breath sounds normal. No wheezing, rales or rhonchi. Abdominal: Soft, nondistended, nontender. Bowel sounds active throughout. There are no masses palpable. No hepatomegaly. Neurological: Alert and oriented to person place and time. Skin: Skin is warm and dry. No rashes noted.   ASSESSMENT AND PLAN;   1) Liver mass Results reviewed from outside MRI.  Incidentally noted 12 cm mass in the right hepatic lobe on recent T-spine imaging.  No prior known history of liver disease.  Discussed DDx to include adenoma, fibrolamellar HCC, and less likely FNH. - MRI liver with and without contrast - Check CMP  2) Colon cancer screening - No previous CRC screening.  Can address role/utility/timing of screening colonoscopy pending evaluation of more pressing issues as above  3) Cholelithiasis Incidentally noted in the time of MRI as well.  Otherwise asymptomatic.  No further studies or intervention needed specifically for this incidental finding at this time.     [name], DO, FACG  06/23/2022, 1:53 PM   Melvenia Beam, MD

## 2022-06-23 NOTE — Patient Instructions (Signed)
You have been scheduled for an MRI at [facility] on 07/01/22. Your appointment time is 300 pm . Please arrive to admitting (at main entrance of the hospital) 30 minutes prior to your appointment time for registration purposes. Please make certain not to have anything to eat or drink 6 hours prior to your test. In addition, if you have any metal in your body, have a pacemaker or defibrillator, please be sure to let your ordering physician know. This test typically takes 45 minutes to 1 hour to complete. Should you need to reschedule, please call [phone] to do so.   Due to recent changes in healthcare laws, you may see the results of your imaging and laboratory studies on MyChart before your provider has had a chance to review them.  We understand that in some cases there may be results that are confusing or concerning to you. Not all laboratory results come back in the same time frame and the provider may be waiting for multiple results in order to interpret others.  Please give [place] 48 hours in order for your provider to thoroughly review all the results before contacting the office for clarification of your results.     Thank you for choosing me and [facility].  Vito Cirigliano, D.O.

## 2022-06-24 ENCOUNTER — Encounter: Payer: Self-pay | Admitting: Gastroenterology

## 2022-06-26 ENCOUNTER — Other Ambulatory Visit: Payer: Self-pay

## 2022-06-28 ENCOUNTER — Other Ambulatory Visit: Payer: Self-pay

## 2022-06-30 ENCOUNTER — Encounter: Payer: Self-pay | Admitting: Neurology

## 2022-07-01 ENCOUNTER — Encounter: Payer: Self-pay | Admitting: Neurology

## 2022-07-01 ENCOUNTER — Ambulatory Visit (HOSPITAL_COMMUNITY): Payer: No Typology Code available for payment source

## 2022-07-01 NOTE — Telephone Encounter (Signed)
I called and got voicemail and left a message.  I will try to reach her later today.

## 2022-07-06 ENCOUNTER — Telehealth: Payer: Self-pay

## 2022-07-06 ENCOUNTER — Encounter: Payer: Self-pay | Admitting: Gastroenterology

## 2022-07-07 NOTE — Telephone Encounter (Signed)
Called medical records at [place] and left voicemail.

## 2022-07-07 NOTE — Telephone Encounter (Signed)
Results of the MRI three-phase liver reviewed and notable for the following: - Enlarged liver measuring up to 20 cm with otherwise normal echogenicity.  In the right lobe of the liver centered in segment 8 there is a predominantly T1 and T2 hypointense mass measuring 11.3 x 9.5 x 11.6 cm.  The mass is circumscribed.  There is a central cystic focus measuring approximately 4.6 x 3.9 cm, possible focal biliary dilatation.  The mass displaces the adjacent hepatic vessels.  There is diffuse heterogenous early hyperenhancement with progressive homogenous enhancement relative to the adjacent liver parenchyma on delayed postcontrast no evidence of washout.  No additional hepatic masses are identified.  The portal and hepatic veins are patent. - Gallstones without cholecystitis or biliary dilatation. - Otherwise normal spleen, pancreas, adrenal glands.  2.2 cm posterior left renal cyst. - No intra-abdominal lymphadenopathy.  There are subcentimeter porta hepatis lymph nodes.  No ascites.  Impression: 1.  Large, enhancing circumscribed mass in the right lobe of the liver measuring up to approximately 12 cm.  Imaging features do not appear aggressive but are indeterminate.  Differential considerations include atypical FNH or fibrolamellar HCC.  Given the size of the mass recommend tissue sampling and evaluation for surgical excision. 2.  Cholelithiasis 3.  Left renal cyst  I called the patient and we discussed these results at length today.  Still not entirely clear the etiology for the mass.  However, given imaging characteristics and size, my strong recommendation is for referral to the Hepatology Clinic at [facility] for evaluation.  May be able to upload these images to review with the Radiologist there.  I would favor their opinion of whether or not this needs to be biopsied and/or surgically resected, which may involve evaluation by the liver transplant surgeon.  She understands and agrees with this  recommendation.  Mickel Baas, can you please send a referral to Dr. Teena Irani at [facility] for evaluation.

## 2022-07-08 NOTE — Telephone Encounter (Signed)
Referral faxed to [place] at [phone].

## 2022-07-19 ENCOUNTER — Encounter: Payer: Self-pay | Admitting: Gastroenterology

## 2022-07-29 DIAGNOSIS — R160 Hepatomegaly, not elsewhere classified: Secondary | ICD-10-CM | POA: Insufficient documentation

## 2022-08-02 NOTE — Telephone Encounter (Signed)
error 

## 2022-10-02 DIAGNOSIS — Z9049 Acquired absence of other specified parts of digestive tract: Secondary | ICD-10-CM | POA: Insufficient documentation

## 2022-10-04 DIAGNOSIS — K219 Gastro-esophageal reflux disease without esophagitis: Secondary | ICD-10-CM | POA: Insufficient documentation

## 2022-10-04 DIAGNOSIS — E878 Other disorders of electrolyte and fluid balance, not elsewhere classified: Secondary | ICD-10-CM | POA: Insufficient documentation

## 2022-10-04 DIAGNOSIS — K567 Ileus, unspecified: Secondary | ICD-10-CM | POA: Insufficient documentation

## 2022-10-21 ENCOUNTER — Encounter: Payer: Self-pay | Admitting: Neurology

## 2022-10-27 ENCOUNTER — Ambulatory Visit: Payer: No Typology Code available for payment source | Admitting: Neurology

## 2022-12-13 ENCOUNTER — Encounter: Payer: Self-pay | Admitting: Neurology

## 2022-12-22 ENCOUNTER — Encounter: Payer: Self-pay | Admitting: Gastroenterology

## 2022-12-27 ENCOUNTER — Ambulatory Visit: Payer: No Typology Code available for payment source | Admitting: Neurology

## 2023-01-27 ENCOUNTER — Ambulatory Visit: Payer: No Typology Code available for payment source | Admitting: Neurology

## 2023-01-27 ENCOUNTER — Telehealth: Payer: Self-pay | Admitting: *Deleted

## 2023-01-27 ENCOUNTER — Encounter: Payer: Self-pay | Admitting: Neurology

## 2023-01-27 VITALS — BP 90/64 | HR 94 | Ht 64.0 in | Wt 134.6 lb

## 2023-01-27 DIAGNOSIS — G35 Multiple sclerosis: Secondary | ICD-10-CM | POA: Diagnosis not present

## 2023-01-27 DIAGNOSIS — R269 Unspecified abnormalities of gait and mobility: Secondary | ICD-10-CM

## 2023-01-27 DIAGNOSIS — D134 Benign neoplasm of liver: Secondary | ICD-10-CM

## 2023-01-27 DIAGNOSIS — R292 Abnormal reflex: Secondary | ICD-10-CM

## 2023-01-27 DIAGNOSIS — G43109 Migraine with aura, not intractable, without status migrainosus: Secondary | ICD-10-CM

## 2023-01-27 MED ORDER — GABAPENTIN 600 MG PO TABS: 600.0000 mg | ORAL_TABLET | Freq: Three times a day (TID) | ORAL | 5 refills | Status: DC

## 2023-01-27 NOTE — Telephone Encounter (Signed)
Had to check the bottom box of form. Had to refax form [phone]

## 2023-01-27 NOTE — Telephone Encounter (Signed)
[employer] Patient enrollment form faxed to [phone] confirmation received.

## 2023-01-27 NOTE — Progress Notes (Signed)
[facility]  PATIENT: [name] DOB: 1975-12-06  REFERRING DOCTOR OR PCP: Helane Rima, DO SOURCE: Patient, notes from [facility] neurology, imaging and lab reports, several MRIs of the brain personally reviewed  _________________________________   HISTORICAL  CHIEF COMPLAINT:  Chief Complaint  Patient presents with   Follow-up    Pt in room 10. Here for Gait disturbance. Gait is same, had right lobe of liver removed in 10/01/22 due to tumor found on mri. No recent falls.     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  Update 01/27/2023 After the last visit we checked brain and spine MRI MRI (06/20/2023).   The spinal cord showed no lesions.  She has scattered brain lesions and there was one additional one compared to previous MRI.  I had spoken with her - between her clinical history, CSF showing borderline number of OCB (3), MRI showing periventricular and juxtacortical lesions, new MRI with additional lesion that I believe she likely has MS.      We discussed options and she would like to start Copaxone  She is walking well.   Strength and sensation are fie.   Ladder function is normal.   Vision is fine.     She has some fatigue daily.    This was worse in heat.    She has noted cognitive issues including reduced focus at work and word finding - started in 2023.   She has some depression and takes Wellbutrin.  She has some anxiety.     She is otherwise healthy.  Vascular risks:   No tobacco.  No HTN, DM, hyperlipidemia.    No cardiac issues.     She has migraines that re well controlled on topiramate.    [name] helps when one occurs  She had a liver lobe resection due to mass that turned out to be a benign leiomyoma.   She is recovering nicely.    MS History: In 2005, she had ataxia veering to the rihgt when she was walking.   She was not dizzy but could not walk straight.   A brain MRI in showed  one subtle right periventricular focus.   She received 3 days of IV steroid.   She got completely back to baseline by discharge.   She has had some sensoy fluctuation in her hands with numbness lasting minutes but not days.    She also has had soe fluctuations in her vision.   Her gait is mildly unsteady at times and she occasionally veers right.   She has needed to use the bannister on stairs the past 2 weeks.   She denies weakness in hands and shoulders.   She has had urinary hesitancy x 3-4 months.   She has had vision issues, left worse than right.  Imaging: MRI of the brain 08/29/2004 shows a subtle right periventricular focus.  MRI of the brain 08/13/2016 shows a T2/flair hyperintense focus in the juxtacortical white matter of the posterior left frontal lobe and 2 small periventricular foci and 2 or 3 punctate foci in the deep white matter.  MRI of the brain 05/05/2017 showed no new lesions.  CSF 04/19/2017 showed 3 oligoclonal bands seen in the CSF not detected in the cervical (criteria was 4)  MRI cervical and thoracic spine 06/19/2022 showed a normal spinal cord, mild cervical DJD, thyroid nodule and 12 cmliver mass  MRI brain 06/19/2022 showed 1 new lesion  compared to 2018  REVIEW OF SYSTEMS: Constitutional: No fevers, chills, sweats, or change in appetite Eyes: No visual changes, double vision, eye pain Ear, nose and throat: No hearing loss, ear pain, nasal congestion, sore throat Cardiovascular: No chest pain, palpitations Respiratory:  No shortness of breath at rest or with exertion.   No wheezes GastrointestinaI: No nausea, vomiting, diarrhea, abdominal pain, fecal incontinence Genitourinary:  No dysuria, urinary retention or frequency.  No nocturia. Musculoskeletal: She has some neck and back pain.  She had a right frozen shoulder and needed surgery in 12/2019.  She did Rehab.   She now has a left frozen that started in December 2022.  She is getting cortisone shots.     Integumentary: No rash, pruritus, skin lesions Neurological: as above Psychiatric: No depression at this time.  No anxiety Endocrine: No palpitations, diaphoresis, change in appetite, change in weigh or increased thirst Hematologic/Lymphatic:  No anemia, purpura, petechiae. Allergic/Immunologic: No itchy/runny eyes, nasal congestion, recent allergic reactions, rashes  ALLERGIES: Allergies  Allergen Reactions   Codeine Nausea Only   Other Other (See Comments)    Anxious - couldn't sit still (brompheniramine DM)    HOME MEDICATIONS:  Current Outpatient Medications:    cyclobenzaprine (FLEXERIL) 10 MG tablet, Take 1 tablet by mouth 3 times daily as needed for muscle spasms., Disp: 90 tablet, Rfl: 0   gabapentin (NEURONTIN) 600 MG tablet, Take 1 tablet (600 mg total) by mouth 3 (three) times daily., Disp: 90 tablet, Rfl: 5   ibuprofen (ADVIL) 200 MG tablet, Take 200 mg by mouth every 6 (six) hours as needed. 3x daily, Disp: , Rfl:    meloxicam (MOBIC) 15 MG tablet, Take 1 tablet by mouth daily., Disp: 90 tablet, Rfl: 1   ondansetron (ZOFRAN) 4 MG tablet, Take 1 tablet (4 mg total) by mouth every 8 (eight) hours as needed for nausea or vomiting., Disp: 20 tablet, Rfl: 0   scopolamine (TRANSDERM-SCOP) 1 MG/3DAYS, Place 1 patch onto the skin every 3 days as directed., Disp: 10 patch, Rfl: 12   spironolactone (ALDACTONE) 50 MG tablet, Take 50 mg by mouth daily., Disp: , Rfl:    triamcinolone cream (KENALOG) 0.1 %, Apply 1 Application topically 2 (two) times daily., Disp: 80 g, Rfl: 0   Ubrogepant (UBRELVY) 50 MG TABS, Take 50 mg by mouth as needed. Take as needed, Disp: , Rfl:    buPROPion (WELLBUTRIN XL) 300 MG 24 hr tablet, Take 1 tablet by mouth daily, Disp: 90 tablet, Rfl: 3   esomeprazole (NEXIUM) 40 MG capsule, Take 1 capsule (40 mg total) by mouth daily., Disp: 90 capsule, Rfl: 0   FLUoxetine (PROZAC) 20 MG tablet, Take 1 capsule by mouth daily., Disp: , Rfl:    fluticasone (FLONASE)  50 MCG/ACT nasal spray, Place 2 sprays into both nostrils daily. (Patient not taking: Reported on 06/23/2022), Disp: 16 g, Rfl: 6   LORazepam (ATIVAN) 1 MG tablet, Take 1 tablet (1 mg total) by mouth at bedtime. (Patient not taking: Reported on 06/23/2022), Disp: 90 tablet, Rfl: 0   SUMAtriptan (IMITREX) 50 MG tablet, Take 1 tablet by mouth as needed for migraine. May repeat in 2 hours if headache persists or recurs., Disp: 10 tablet, Rfl: 2   Topiramate ER (TROKENDI XR) 100 MG CP24, Take 1 capsule by mouth daily, Disp: 90 capsule, Rfl: 3  PAST MEDICAL HISTORY: Past Medical History:  Diagnosis Date   Anxiety    Carpal tunnel syndrome    Chronic lower back pain  Chronic tension headaches    Constipation    Dysmenorrhea    Endometrial polyp    Fatigue    Frozen shoulder    GERD (gastroesophageal reflux disease)    History of lumbar puncture 08/2016   HPV in female    Low serum vitamin D    Neck pain, chronic    Pelvic pain    Thyroid nodule    Tinnitus, bilateral    TMJ (temporomandibular joint disorder)    White matter abnormality on MRI of brain     PAST SURGICAL HISTORY: Past Surgical History:  Procedure Laterality Date   DE QUERVAIN'S RELEASE Bilateral    DILATATION & CURETTAGE/HYSTEROSCOPY WITH MYOSURE N/A 10/24/2017   Procedure: DILATATION & CURETTAGE/HYSTEROSCOPY WITH MYOSURE;  Surgeon: Romualdo Bolk, MD;  Location: [facility];  Service: Gynecology;  Laterality: N/A;   IUD REMOVAL N/A 10/24/2017   Procedure: INTRAUTERINE DEVICE (IUD) REMOVAL;  Surgeon: Romualdo Bolk, MD;  Location[facility];  Service: Gynecology;  Laterality: N/A;   TONSILLECTOMY     WISDOM TOOTH EXTRACTION     WRIST SURGERY Bilateral    2 ganglion cyst removed    FAMILY HISTORY: Family History  Problem Relation Age of Onset   ADD / ADHD Mother    Colon polyps Mother    Colon polyps Father    Colon cancer Maternal Grandmother    Colon polyps  Maternal Grandfather    Diabetes Maternal Grandfather    Colon polyps Paternal Grandmother    Colon polyps Paternal Grandfather     SOCIAL HISTORY: Social History   Socioeconomic History   Marital status: Divorced    Spouse name: Will   Number of children: 1   Years of education: Not on file   Highest education level: Not on file  Occupational History    Employer: [employer]   Occupation: CMA  Tobacco Use   Smoking status: Former    Packs/day: 18.00    Years: 1.00    Additional pack years: 0.00    Total pack years: 18.00    Types: Cigarettes    Quit date: 12/29/2012    Years since quitting: 10.0   Smokeless tobacco: Never  Vaping Use   Vaping Use: Never used  Substance and Sexual Activity   Alcohol use: Yes    Alcohol/week: 1.0 - 2.0 standard drink of alcohol    Types: 1 - 2 Standard drinks or equivalent per week   Drug use: No   Sexual activity: Yes    Partners: Male    Birth control/protection: Pill  Other Topics Concern   Not on file  Social History Narrative   Not on file   Social Determinants of Health   Financial Resource Strain: Not on file  Food Insecurity: Not on file  Transportation Needs: Not on file  Physical Activity: Not on file  Stress: Not on file  Social Connections: Not on file  Intimate Partner Violence: Not on file       PHYSICAL EXAM  Vitals:   01/27/23 0830  BP: 90/64  Pulse: 94  Weight: 134 lb 9.6 oz (61.1 kg)  Height: 5\' 4"  (1.626 m)    Body mass index is 23.1 kg/m.   No results found.   General: The patient is well-developed and well-nourished and in no acute distress  HEENT:  Head is [place]/AT.  Sclera are anicteric.      Skin: Extremities are without rash or  edema.  Musculoskeletal:  Back is nontender  Neurologic Exam  Mental status: The patient is alert and oriented x 3 at the time of the examination. The patient has apparent normal recent and remote memory, with an apparently normal attention span and  concentration ability.   Speech is normal.  Cranial nerves: Extraocular movements are full.  COlor vision is symmetric.Facial symmetry is present. There is good facial sensation to soft touch bilaterally.Facial strength is normal.  Trapezius and sternocleidomastoid strength is normal. No dysarthria is noted.  The tongue is midline, and the patient has symmetric elevation of the soft palate. No obvious hearing deficits are noted.  Motor:  Muscle bulk is normal.   Tone is normal. Strength is  5 / 5 in all 4 extremities.   Sensory: Sensory testing is intact to pinprick, soft touch and vibration sensation in all 4 extremities.  Coordination: Cerebellar testing reveals good finger-nose-finger and heel-to-shin bilaterally.  Gait and station: Station is normal.   Gait is normal. Tandem gait is slightly wide. Romberg is negative.   Reflexes: Deep tendon reflexes are symmetric and normal in arms and 3 at knees, 2 at ankles.       DIAGNOSTIC DATA (LABS, IMAGING, TESTING) - I reviewed patient records, labs, notes, testing and imaging myself where available.  Lab Results  Component Value Date   WBC 10.4 10/30/2020   HGB 14.4 10/30/2020   HCT 44.5 10/30/2020   MCV 96 10/30/2020   PLT 284 10/30/2020      Component Value Date/Time   NA 142 10/30/2020 0807   K 4.3 10/30/2020 0807   CL 106 10/30/2020 0807   CO2 18 (L) 10/30/2020 0807   GLUCOSE 96 10/30/2020 0807   GLUCOSE 92 05/16/2019 1558   BUN 11 10/30/2020 0807   CREATININE 0.64 10/30/2020 0807   CALCIUM 9.4 10/30/2020 0807   PROT 7.1 10/30/2020 0807   ALBUMIN 4.8 10/30/2020 0807   AST 5 10/30/2020 0807   ALT 5 10/30/2020 0807   ALKPHOS 78 10/30/2020 0807   BILITOT <0.2 10/30/2020 0807   GFRNONAA 108 10/30/2020 0807   GFRAA 125 10/30/2020 0807   Lab Results  Component Value Date   CHOL 212 (H) 11/07/2020   HDL 59.40 11/07/2020   LDLCALC 124 (H) 11/07/2020   TRIG 141.0 11/07/2020   CHOLHDL 4 11/07/2020   Lab Results   Component Value Date   HGBA1C 5.6 05/16/2019   Lab Results  Component Value Date   VITAMINB12 350 06/10/2022   Lab Results  Component Value Date   TSH 3.60 11/07/2020       ASSESSMENT AND PLAN  Multiple sclerosis (HCC)  Gait disturbance  Hyperreflexia  Migraine with aura and without status migrainosus, not intractable  Leiomyoma of liver  MRI late last year showed one additional T2/FLAIR lesion.  We discussed chance of MS is likely around 85%.  We discussed options and she will start glatiramer. Her liver issues are going well - tumor was benign. Rtc 6 months, sooner if new or   40-minute office visit with the majority of the time spent face-to-face for history and physical, discussion/counseling and decision-making.  Additional time with record review and documentation.   Roshawn Ayala A. Epimenio Foot, MD, [facility] 01/27/2023, 9:28 AM Certified in Neurology, Clinical Neurophysiology, Sleep Medicine and Neuroimaging  [facility] [address] [place], [place] [address] [phone]

## 2023-01-31 ENCOUNTER — Telehealth: Payer: Self-pay

## 2023-01-31 ENCOUNTER — Other Ambulatory Visit (HOSPITAL_COMMUNITY): Payer: Self-pay

## 2023-02-01 NOTE — Telephone Encounter (Signed)
PA for glatiramer was approved by [employer]: "Request Reference Number: [id number]. GLATIRAMER INJ 40MG /ML is approved through 02/01/2024. Your patient may now fill this prescription and it will be covered."

## 2023-02-01 NOTE — Telephone Encounter (Signed)
Completed PA for glatiramer via CMM and sent to [employer]. Key: [id number]. Should have a determination within 3-5 business days.

## 2023-02-07 ENCOUNTER — Encounter: Payer: Self-pay | Admitting: Neurology

## 2023-02-08 ENCOUNTER — Other Ambulatory Visit: Payer: Self-pay | Admitting: Neurology

## 2023-02-08 MED ORDER — TIZANIDINE HCL 4 MG PO TABS
4.0000 mg | ORAL_TABLET | Freq: Three times a day (TID) | ORAL | 5 refills | Status: DC
Start: 2023-02-08 — End: 2023-03-07

## 2023-02-18 NOTE — Telephone Encounter (Signed)
Error

## 2023-02-24 ENCOUNTER — Encounter: Payer: Self-pay | Admitting: Neurology

## 2023-03-07 ENCOUNTER — Other Ambulatory Visit: Payer: Self-pay | Admitting: *Deleted

## 2023-03-07 MED ORDER — GABAPENTIN 600 MG PO TABS: ORAL_TABLET | ORAL | 5 refills | Status: DC

## 2023-03-07 MED ORDER — TIZANIDINE HCL 4 MG PO TABS: 4.0000 mg | ORAL_TABLET | Freq: Four times a day (QID) | ORAL | 5 refills | Status: DC

## 2023-03-21 ENCOUNTER — Other Ambulatory Visit: Payer: Self-pay

## 2023-04-02 ENCOUNTER — Telehealth: Payer: No Typology Code available for payment source | Admitting: Nurse Practitioner

## 2023-04-02 DIAGNOSIS — J0101 Acute recurrent maxillary sinusitis: Secondary | ICD-10-CM

## 2023-04-02 DIAGNOSIS — R110 Nausea: Secondary | ICD-10-CM

## 2023-04-02 MED ORDER — AMOXICILLIN-POT CLAVULANATE 875-125 MG PO TABS: 1.0000 | ORAL_TABLET | Freq: Two times a day (BID) | ORAL | 0 refills | Status: DC

## 2023-04-02 MED ORDER — PROMETHAZINE HCL 25 MG PO TABS: 25.0000 mg | ORAL_TABLET | Freq: Three times a day (TID) | ORAL | 0 refills | Status: DC | PRN

## 2023-04-02 NOTE — Progress Notes (Signed)
E-Visit for Sinus Problems  We are sorry that you are not feeling well.  Here is how we plan to help!  Based on what you have shared with me it looks like you have sinusitis.  Sinusitis is inflammation and infection in the sinus cavities of the head.  Based on your presentation I believe you most likely have Acute Bacterial Sinusitis.  This is an infection caused by bacteria and is treated with antibiotics. I have prescribed Augmentin 875mg /125mg  one tablet twice daily with food, for 7 days. You may use an oral decongestant such as Mucinex D or if you have glaucoma or high blood pressure use plain Mucinex. Saline nasal spray help and can safely be used as often as needed for congestion.  If you develop worsening sinus pain, fever or notice severe headache and vision changes, or if symptoms are not better after completion of antibiotic, please schedule an appointment with a health care provider.   Phenergan 25mg  1 po tid prn nausea Sinus infections are not as easily transmitted as other respiratory infection, however we still recommend that you avoid close contact with loved ones, especially the very young and elderly.  Remember to wash your hands thoroughly throughout the day as this is the number one way to prevent the spread of infection!  Home Care: Only take medications as instructed by your medical team. Complete the entire course of an antibiotic. Do not take these medications with alcohol. A steam or ultrasonic humidifier can help congestion.  You can place a towel over your head and breathe in the steam from hot water coming from a faucet. Avoid close contacts especially the very young and the elderly. Cover your mouth when you cough or sneeze. Always remember to wash your hands.  Get Help Right Away If: You develop worsening fever or sinus pain. You develop a severe head ache or visual changes. Your symptoms persist after you have completed your treatment plan.  Make sure  you Understand these instructions. Will watch your condition. Will get help right away if you are not doing well or get worse.  Thank you for choosing an e-visit.  Your e-visit answers were reviewed by a board certified advanced clinical practitioner to complete your personal care plan. Depending upon the condition, your plan could have included both over the counter or prescription medications.  Please review your pharmacy choice. Make sure the pharmacy is open so you can pick up prescription now. If there is a problem, you may contact your provider through [employer] and have the prescription routed to another pharmacy.  Your safety is important to [place]. If you have drug allergies check your prescription carefully.   For the next 24 hours you can use MyChart to ask questions about today's visit, request a non-urgent call back, or ask for a work or school excuse. You will get an email in the next two days asking about your experience. I hope that your e-visit has been valuable and will speed your recovery.  Mary-Margaret Daphine Deutscher, FNP   5-10 minutes spent reviewing and documenting in chart.

## 2023-04-18 ENCOUNTER — Emergency Department (HOSPITAL_BASED_OUTPATIENT_CLINIC_OR_DEPARTMENT_OTHER)
Admission: EM | Admit: 2023-04-18 | Discharge: 2023-04-18 | Disposition: A | Discharge status: Home / Self Care | Payer: No Typology Code available for payment source | Attending: Emergency Medicine | Admitting: Emergency Medicine

## 2023-04-18 ENCOUNTER — Emergency Department (HOSPITAL_BASED_OUTPATIENT_CLINIC_OR_DEPARTMENT_OTHER): Payer: No Typology Code available for payment source

## 2023-04-18 ENCOUNTER — Other Ambulatory Visit: Payer: Self-pay

## 2023-04-18 DIAGNOSIS — R1031 Right lower quadrant pain: Secondary | ICD-10-CM | POA: Diagnosis not present

## 2023-04-18 DIAGNOSIS — N83202 Unspecified ovarian cyst, left side: Secondary | ICD-10-CM

## 2023-04-18 DIAGNOSIS — R110 Nausea: Secondary | ICD-10-CM | POA: Insufficient documentation

## 2023-04-18 DIAGNOSIS — Z86018 Personal history of other benign neoplasm: Secondary | ICD-10-CM | POA: Diagnosis not present

## 2023-04-18 DIAGNOSIS — R1030 Lower abdominal pain, unspecified: Secondary | ICD-10-CM

## 2023-04-18 DIAGNOSIS — D72829 Elevated white blood cell count, unspecified: Secondary | ICD-10-CM | POA: Diagnosis not present

## 2023-04-18 LAB — CBC WITH DIFFERENTIAL/PLATELET
Abs Immature Granulocytes: 0.05 10*3/uL (ref 0.00–0.07)
Basophils Absolute: 0.1 10*3/uL (ref 0.0–0.1)
Basophils Relative: 1 %
Eosinophils Absolute: 0.4 10*3/uL (ref 0.0–0.5)
Eosinophils Relative: 3 %
HCT: 38 % (ref 36.0–46.0)
Hemoglobin: 12.8 g/dL (ref 12.0–15.0)
Immature Granulocytes: 0 %
Lymphocytes Relative: 20 %
Lymphs Abs: 2.4 10*3/uL (ref 0.7–4.0)
MCH: 31.4 pg (ref 26.0–34.0)
MCHC: 33.7 g/dL (ref 30.0–36.0)
MCV: 93.1 fL (ref 80.0–100.0)
Monocytes Absolute: 0.6 10*3/uL (ref 0.1–1.0)
Monocytes Relative: 5 %
Neutro Abs: 8.5 10*3/uL — ABNORMAL HIGH (ref 1.7–7.7)
Neutrophils Relative %: 71 %
Platelets: 233 10*3/uL (ref 150–400)
RBC: 4.08 MIL/uL (ref 3.87–5.11)
RDW: 13.1 % (ref 11.5–15.5)
WBC: 12.1 10*3/uL — ABNORMAL HIGH (ref 4.0–10.5)
nRBC: 0 % (ref 0.0–0.2)

## 2023-04-18 LAB — BASIC METABOLIC PANEL
Anion gap: 10 (ref 5–15)
BUN: 24 mg/dL — ABNORMAL HIGH (ref 6–20)
CO2: 21 mmol/L — ABNORMAL LOW (ref 22–32)
Calcium: 9.5 mg/dL (ref 8.9–10.3)
Chloride: 106 mmol/L (ref 98–111)
Creatinine, Ser: 0.84 mg/dL (ref 0.44–1.00)
GFR, Estimated: >60 mL/min (ref >60)
Glucose, Bld: 98 mg/dL (ref 70–99)
Potassium: 4.1 mmol/L (ref 3.5–5.1)
Sodium: 137 mmol/L (ref 135–145)

## 2023-04-18 LAB — HCG, QUANTITATIVE, PREGNANCY: hCG, Beta Chain, Quant, S: <1 m[IU]/mL (ref <5)

## 2023-04-18 LAB — HEPATIC FUNCTION PANEL
ALT: 10 U/L (ref 0–44)
AST: 12 U/L — ABNORMAL LOW (ref 15–41)
Albumin: 4.5 g/dL (ref 3.5–5.0)
Alkaline Phosphatase: 89 U/L (ref 38–126)
Bilirubin, Direct: <0.1 mg/dL (ref 0.0–0.2)
Total Bilirubin: 0.2 mg/dL — ABNORMAL LOW (ref 0.3–1.2)
Total Protein: 6.8 g/dL (ref 6.5–8.1)

## 2023-04-18 LAB — LIPASE, BLOOD: Lipase: 23 U/L (ref 11–51)

## 2023-04-18 MED ORDER — IOHEXOL 300 MG/ML  SOLN
100.0000 mL | Freq: Once | INTRAMUSCULAR | Status: AC | PRN
  Administered 2023-04-18: 75 mL via INTRAVENOUS

## 2023-04-18 MED ORDER — HYDROMORPHONE HCL 1 MG/ML IJ SOLN
0.5000 mg | Freq: Once | INTRAMUSCULAR | Status: AC
  Administered 2023-04-18: 0.5 mg via INTRAVENOUS
  Filled 2023-04-18: qty 1

## 2023-04-18 MED ORDER — ONDANSETRON HCL 4 MG/2ML IJ SOLN
4.0000 mg | Freq: Once | INTRAMUSCULAR | Status: AC
  Administered 2023-04-18: 4 mg via INTRAVENOUS
  Filled 2023-04-18: qty 2

## 2023-04-18 NOTE — ED Provider Triage Note (Signed)
Emergency Medicine Provider Triage Evaluation Note  Laiba M Muzzy , a 47 y.o. female  was evaluated in triage.  Pt complains of right abdominal pain.  Review of Systems  Positive:  Negative:   Physical Exam  BP 122/82 (BP Location: Right Arm)   Pulse 75   Temp 97.9 F (36.6 C)   Resp 16   Ht 5\' 4"  (1.626 m)   Wt 59 kg   SpO2 100%   BMI 22.31 kg/m  Gen:   Awake, no distress   Resp:  Normal effort  MSK:   Moves extremities without difficulty  Other:    Medical Decision Making  Medically screening exam initiated at 2:42 PM.  Appropriate orders placed.  Ervin M Hoe was informed that the remainder of the evaluation will be completed by another provider, this initial triage assessment does not replace that evaluation, and the importance of remaining in the ED until their evaluation is complete.  S/p right lobe of liver surgery 09/2022. Has pain in right side of abdomen intermittently since surgery. Patient states that this pain is more severe.  Denies fever, chest pain, dyspnea, nausea, vomiting, diarrhea, dysuria, hematuria. Last BM yesterday. No blood thinners.   Valrie Hart F, [place] 04/18/23 1446

## 2023-04-18 NOTE — ED Triage Notes (Signed)
Pt to ED c/o right sided abdominal pain that started this morning. Pt reports surgical hx of right lobe of liver removal. Denies urinary symptoms/

## 2023-04-18 NOTE — Discharge Instructions (Signed)
It was a pleasure caring for you today. Seek emergency care if experiencing any new or worsening symptoms.

## 2023-04-18 NOTE — ED Provider Notes (Signed)
[facility] EMERGENCY DEPARTMENT AT [facility] Provider Note   CSN: [id number] Arrival date & time: 04/18/23  1357     History  Chief Complaint  Patient presents with   Abdominal Pain    [name] is a 47 y.o. female, history of MS, liver resection, for benign leiomyoma, who presents to the ED secondary to right lower quadrant abdominal pain, that feels ripping, that began around 930.  She states the pain is just gotten worse, and she cannot tolerate it.  States it is ripping in nature, and associated with nausea, but no vomiting.  Denies any vaginal discharge or urinary symptoms.  No fevers or chills.  Her liver resection was earlier this year in January.  Notes that moving can make it worse. Home Medications Prior to Admission medications   Medication Sig Start Date End Date Taking? Authorizing Provider  amoxicillin-clavulanate (AUGMENTIN) 875-125 MG tablet Take 1 tablet by mouth 2 (two) times daily. 04/02/23   Bennie Pierini, FNP  buPROPion (WELLBUTRIN XL) 300 MG 24 hr tablet Take 1 tablet by mouth daily 05/11/21 06/16/22  Helane Rima, DO  esomeprazole (NEXIUM) 40 MG capsule Take 1 capsule (40 mg total) by mouth daily. 10/20/21 01/18/22  Helane Rima, DO  fluticasone (FLONASE) 50 MCG/ACT nasal spray Place 2 sprays into both nostrils daily. Patient not taking: Reported on 06/23/2022 06/24/21   Junie Spencer, FNP  gabapentin (NEURONTIN) 600 MG tablet Take 600mg  in the morning, 600mg  in the afternoon and 1200mg  at bedtime 03/07/23   Sater, Pearletha Furl, MD  Glatiramer Acetate 40 MG/ML SOSY Inject 40 mg into the skin 3 (three) times a week.    Sater, Pearletha Furl, MD  ibuprofen (ADVIL) 200 MG tablet Take 200 mg by mouth every 6 (six) hours as needed. 3x daily    [provider]  meloxicam (MOBIC) 15 MG tablet Take 1 tablet by mouth daily. 09/30/21   Helane Rima, DO  ondansetron (ZOFRAN) 4 MG tablet Take 1 tablet (4 mg total) by mouth every 8 (eight) hours as needed  for nausea or vomiting. 11/05/21   Helane Rima, DO  promethazine (PHENERGAN) 25 MG tablet Take 1 tablet (25 mg total) by mouth every 8 (eight) hours as needed for nausea or vomiting. 04/02/23   Bennie Pierini, FNP  scopolamine (TRANSDERM-SCOP) 1 MG/3DAYS Place 1 patch onto the skin every 3 days as directed. 12/07/21   Helane Rima, DO  spironolactone (ALDACTONE) 50 MG tablet Take 50 mg by mouth daily.    [provider]  tiZANidine (ZANAFLEX) 4 MG tablet Take 1 tablet (4 mg total) by mouth in the morning, at noon, in the evening, and at bedtime. 03/07/23   Sater, Pearletha Furl, MD  triamcinolone cream (KENALOG) 0.1 % Apply 1 Application topically 2 (two) times daily. 03/24/22   Margaretann Loveless, PA-C  Ubrogepant (UBRELVY) 50 MG TABS Take 50 mg by mouth as needed. Take as needed    [provider]      Allergies    Codeine and Other    Review of Systems   Review of Systems  Respiratory:  Negative for shortness of breath.   Gastrointestinal:  Positive for abdominal pain.    Physical Exam Updated Vital Signs BP 118/75   Pulse 67   Temp 97.9 F (36.6 C)   Resp 16   Ht 5\' 4"  (1.626 m)   Wt 59 kg   SpO2 100%   BMI 22.31 kg/m  Physical Exam Vitals and nursing  note reviewed.  Constitutional:      General: She is not in acute distress.    Appearance: She is well-developed.  HENT:     Head: Normocephalic and atraumatic.  Eyes:     Conjunctiva/sclera: Conjunctivae normal.  Cardiovascular:     Rate and Rhythm: Normal rate and regular rhythm.     Heart sounds: No murmur heard. Pulmonary:     Effort: Pulmonary effort is normal. No respiratory distress.     Breath sounds: Normal breath sounds.  Abdominal:     Palpations: Abdomen is soft.     Tenderness: There is abdominal tenderness in the right lower quadrant. There is guarding.  Musculoskeletal:        General: No swelling.     Cervical back: Neck supple.  Skin:    General: Skin is warm and dry.      Capillary Refill: Capillary refill takes less than 2 seconds.  Neurological:     Mental Status: She is alert.  Psychiatric:        Mood and Affect: Mood normal.     ED Results / Procedures / Treatments   Labs (all labs ordered are listed, but only abnormal results are displayed) Labs Reviewed  BASIC METABOLIC PANEL - Abnormal; Notable for the following components:      Result Value   CO2 21 (*)    BUN 24 (*)    All other components within normal limits  CBC WITH DIFFERENTIAL/PLATELET - Abnormal; Notable for the following components:   WBC 12.1 (*)    Neutro Abs 8.5 (*)    All other components within normal limits  HEPATIC FUNCTION PANEL - Abnormal; Notable for the following components:   AST 12 (*)    Total Bilirubin 0.2 (*)    All other components within normal limits  LIPASE, BLOOD  HCG, QUANTITATIVE, PREGNANCY    EKG None  Radiology CT ABDOMEN PELVIS W CONTRAST  Result Date: 04/18/2023 CLINICAL DATA:  Right lower abdominal pain, history of partial hepatectomy EXAM: CT ABDOMEN AND PELVIS WITH CONTRAST TECHNIQUE: Multidetector CT imaging of the abdomen and pelvis was performed using the standard protocol following bolus administration of intravenous contrast. RADIATION DOSE REDUCTION: This exam was performed according to the departmental dose-optimization program which includes automated exposure control, adjustment of the mA and/or kV according to patient size and/or use of iterative reconstruction technique. CONTRAST:  75mL OMNIPAQUE IOHEXOL 300 MG/ML  SOLN COMPARISON:  None Available. FINDINGS: Lower chest: No pleural or pericardial effusion. Visualized lung bases clear. Hepatobiliary: Changes of partial right hepatectomy. Portal vein patent. No focal lesion or biliary ductal dilatation. Pancreas: Unremarkable. No pancreatic ductal dilatation or surrounding inflammatory changes. Spleen: Normal in size without focal abnormality. Adrenals/Urinary Tract: No adrenal mass. Symmetric  renal parenchymal enhancement without worrisome lesion or hydronephrosis. Urinary bladder physiologically distended. Stomach/Bowel: Stomach is distended by ingested material, without acute finding. Duodenum is nondistended. Louanne Calvillo bowel nondilated. Normal appendix. Moderate fluid and fecal material partially distend the colon. Rectum decompressed. Vascular/Lymphatic: Mild scattered calcified aortic plaque without aneurysm. Portal vein patent. No abdominal or pelvic adenopathy. Reproductive: Uterus and bilateral adnexa are unremarkable. Other: No ascites.  No free air. Musculoskeletal: Bilateral nondisplaced L5 pars defects. No anterolisthesis. Benign-appearing sclerotic foci in T10 spinous process, right sacral ala, and left ilium. IMPRESSION: 1. No acute findings. 2. Changes of partial right hepatectomy. 3. Bilateral L5 pars defects. 4.  Aortic Atherosclerosis (ICD10-I70.0). Electronically Signed   By: Corlis Leak M.D.   On: 04/18/2023 16:37  Procedures Procedures    Medications Ordered in ED Medications  iohexol (OMNIPAQUE) 300 MG/ML solution 100 mL (75 mLs Intravenous Contrast Given 04/18/23 1611)  HYDROmorphone (DILAUDID) injection 0.5 mg (0.5 mg Intravenous Given 04/18/23 1719)  ondansetron (ZOFRAN) injection 4 mg (4 mg Intravenous Given 04/18/23 1719)    ED Course/ Medical Decision Making/ A&P                                 Medical Decision Making Patient is a 47 year old female, Hu for history of liver resection, who presents to the ED secondary to right lower quadrant abdominal pain, that began at 930 today.  It is sharp and stabbing and ripping, and worse with motion.  Denies any heavy lifting, or trauma.  Denies any vaginal symptoms or urinary symptoms.  CT abdomen pelvis, ordered, and if negative will order a transvaginal ultrasound for further evaluation.  She does have a history of ovarian cyst, so I am possibly suspicious for this versus torsion.  Amount and/or Complexity of Data  Reviewed Labs: ordered.    Details: Mild leukocytosis of 12.1 Radiology: ordered.    Details: CT abd pelvis, shows no acute findings Discussion of management or test interpretation with external provider(s): Handed off to [facility], for further evaluation, ultrasound pending at this time.  She will further dispo the patient.    Risk Prescription drug management.    Final Clinical Impression(s) / ED Diagnoses Final diagnoses:  None    Rx / DC Orders ED Discharge Orders     None         Yasamin Karel, [place], PA 04/18/23 1901    Virgina Norfolk, DO 04/18/23 2018

## 2023-04-18 NOTE — ED Notes (Signed)
Pt ambulatory to bathroom. NAD noted at this time.

## 2023-04-18 NOTE — ED Provider Notes (Signed)
  Accepted handoff at shift change from [facility],  PA-C. Please see prior provider note for more detail.   Briefly: Patient is 47 y.o. "presents to the ED secondary to right lower quadrant abdominal pain, that feels ripping, that began around 930. She states the pain is just gotten worse, and she cannot tolerate it. States it is ripping in nature, and associated with nausea, but no vomiting. Denies any vaginal discharge or urinary symptoms. No fevers or chills. Her liver resection was earlier this year in January. Notes that moving can make it worse."    Plan:  - Dispo pending US - transvaginal US showing 3.2 cm simple appearing cyst. No evidence of ovarian torsion  -Discussed results with patient.  Patient stating that her pain is feeling better and she is ready for discharge.  Offered to prescribe patient some naproxen which she refused stating that she would rather take ibuprofen at home. -Patient afebrile with stable vitals.  Provided with return precautions.  Discharged in good condition.      Dorthy Cooler, [place] 04/18/23 1943    Virgina Norfolk, DO 04/18/23 2018

## 2023-04-18 NOTE — ED Provider Notes (Incomplete)
  Accepted handoff at shift change from *** PA-C. Please see prior provider note for more detail.   Briefly: Patient is 47 y.o.   DDX: concern for ***  Plan: ***   - dispo pending [place]

## 2023-04-19 ENCOUNTER — Encounter: Payer: Self-pay | Admitting: Neurology

## 2023-04-20 ENCOUNTER — Other Ambulatory Visit: Payer: Self-pay | Admitting: *Deleted

## 2023-04-20 MED ORDER — GABAPENTIN 600 MG PO TABS: ORAL_TABLET | ORAL | 1 refills | Status: DC

## 2023-04-20 MED ORDER — TIZANIDINE HCL 4 MG PO TABS: 4.0000 mg | ORAL_TABLET | Freq: Four times a day (QID) | ORAL | 1 refills | Status: DC

## 2023-04-21 ENCOUNTER — Ambulatory Visit: Payer: No Typology Code available for payment source | Admitting: Neurology

## 2023-04-29 ENCOUNTER — Other Ambulatory Visit: Payer: Self-pay | Admitting: Family Medicine

## 2023-04-29 DIAGNOSIS — Z1231 Encounter for screening mammogram for malignant neoplasm of breast: Secondary | ICD-10-CM

## 2023-04-30 ENCOUNTER — Encounter: Payer: Self-pay | Admitting: Neurology

## 2023-05-02 ENCOUNTER — Other Ambulatory Visit: Payer: Self-pay | Admitting: Neurology

## 2023-05-02 MED ORDER — QULIPTA 60 MG PO TABS: ORAL_TABLET | ORAL | 3 refills | Status: DC

## 2023-05-11 ENCOUNTER — Other Ambulatory Visit: Payer: Self-pay

## 2023-05-18 ENCOUNTER — Other Ambulatory Visit: Payer: Self-pay | Admitting: Family Medicine

## 2023-05-18 DIAGNOSIS — Z1231 Encounter for screening mammogram for malignant neoplasm of breast: Secondary | ICD-10-CM

## 2023-05-20 ENCOUNTER — Other Ambulatory Visit (HOSPITAL_COMMUNITY): Payer: Self-pay

## 2023-06-03 DIAGNOSIS — Z1231 Encounter for screening mammogram for malignant neoplasm of breast: Secondary | ICD-10-CM

## 2023-06-15 ENCOUNTER — Other Ambulatory Visit: Payer: Self-pay | Admitting: Family Medicine

## 2023-06-15 DIAGNOSIS — Z1231 Encounter for screening mammogram for malignant neoplasm of breast: Secondary | ICD-10-CM

## 2023-06-20 ENCOUNTER — Encounter: Payer: Self-pay | Admitting: Neurology

## 2023-07-08 ENCOUNTER — Telehealth: Payer: No Typology Code available for payment source | Admitting: Physician Assistant

## 2023-07-08 DIAGNOSIS — Z1231 Encounter for screening mammogram for malignant neoplasm of breast: Secondary | ICD-10-CM

## 2023-07-08 DIAGNOSIS — R112 Nausea with vomiting, unspecified: Secondary | ICD-10-CM | POA: Diagnosis not present

## 2023-07-08 MED ORDER — PROMETHAZINE HCL 25 MG PO TABS: 25.0000 mg | ORAL_TABLET | Freq: Two times a day (BID) | ORAL | 0 refills | Status: DC | PRN

## 2023-07-08 NOTE — Progress Notes (Signed)
E-Visit for Nausea and Vomiting   We are sorry that you are not feeling well. Here is how we plan to help!  Based on what you have shared with me it looks like you have a Virus that is irritating your GI tract.  Vomiting is the forceful emptying of a portion of the stomach's content through the mouth.  Although nausea and vomiting can make you feel miserable, it's important to remember that these are not diseases, but rather symptoms of an underlying illness.  When we treat short term symptoms, we always caution that any symptoms that persist should be fully evaluated in a medical office.  I have prescribed a medication that will help alleviate your symptoms and allow you to stay hydrated:  Promethazine 25 mg take 1 tablet twice daily  HOME CARE: Drink clear liquids.  This is very important! Dehydration (the lack of fluid) can lead to a serious complication.  Start off with 1 tablespoon every 5 minutes for 8 hours. You may begin eating bland foods after 8 hours without vomiting.  Start with saltine crackers, white bread, rice, mashed potatoes, applesauce. After 48 hours on a bland diet, you may resume a normal diet. Try to go to sleep.  Sleep often empties the stomach and relieves the need to vomit.  GET HELP RIGHT AWAY IF:  Your symptoms do not improve or worsen within 2 days after treatment. You have a fever for over 3 days. You cannot keep down fluids after trying the medication.  MAKE SURE YOU:  Understand these instructions. Will watch your condition. Will get help right away if you are not doing well or get worse.    Thank you for choosing an e-visit.  Your e-visit answers were reviewed by a board certified advanced clinical practitioner to complete your personal care plan. Depending upon the condition, your plan could have included both over the counter or prescription medications.  Please review your pharmacy choice. Make sure the pharmacy is open so you can pick up  prescription now. If there is a problem, you may contact your provider through [employer] and have the prescription routed to another pharmacy.  Your safety is important to [place]. If you have drug allergies check your prescription carefully.   For the next 24 hours you can use MyChart to ask questions about today's visit, request a non-urgent call back, or ask for a work or school excuse. You will get an email in the next two days asking about your experience. I hope that your e-visit has been valuable and will speed your recovery.  I have spent 5 minutes in review of e-visit questionnaire, review and updating patient chart, medical decision making and response to patient.   Mar Daring, PA-C

## 2023-07-14 ENCOUNTER — Other Ambulatory Visit: Payer: Self-pay | Admitting: Family Medicine

## 2023-07-14 DIAGNOSIS — Z1231 Encounter for screening mammogram for malignant neoplasm of breast: Secondary | ICD-10-CM

## 2023-07-30 ENCOUNTER — Telehealth: Payer: No Typology Code available for payment source | Admitting: Nurse Practitioner

## 2023-07-30 DIAGNOSIS — B9689 Other specified bacterial agents as the cause of diseases classified elsewhere: Secondary | ICD-10-CM | POA: Diagnosis not present

## 2023-07-30 DIAGNOSIS — J019 Acute sinusitis, unspecified: Secondary | ICD-10-CM

## 2023-07-30 DIAGNOSIS — G35 Multiple sclerosis: Secondary | ICD-10-CM | POA: Diagnosis not present

## 2023-07-30 MED ORDER — AMOXICILLIN-POT CLAVULANATE 875-125 MG PO TABS
1.0000 | ORAL_TABLET | Freq: Two times a day (BID) | ORAL | 0 refills | Status: DC
Start: 2023-07-30 — End: 2023-08-08

## 2023-07-30 MED ORDER — BACLOFEN 10 MG PO TABS
10.0000 mg | ORAL_TABLET | Freq: Three times a day (TID) | ORAL | 0 refills | Status: DC
Start: 2023-07-30 — End: 2023-08-08

## 2023-07-30 NOTE — Progress Notes (Signed)
E-Visit for Sinus Problems  We are sorry that you are not feeling well.  Here is how we plan to help!  Based on what you have shared with me it looks like you have sinusitis.  Sinusitis is inflammation and infection in the sinus cavities of the head.  Based on your presentation I believe you most likely have Acute Bacterial Sinusitis.  This is an infection caused by bacteria and is treated with antibiotics. I have prescribed Augmentin 875mg /125mg  one tablet twice daily with food, for 7 days. You may use an oral decongestant such as Mucinex D or if you have glaucoma or high blood pressure use plain Mucinex. I also sent baclofen for your muscle spasms. If this is not effective you will likely need to follow up with your PCP.   Saline nasal spray help and can safely be used as often as needed for congestion.  If you develop worsening sinus pain, fever or notice severe headache and vision changes, or if symptoms are not better after completion of antibiotic, please schedule an appointment with a health care provider.    Sinus infections are not as easily transmitted as other respiratory infection, however we still recommend that you avoid close contact with loved ones, especially the very young and elderly.  Remember to wash your hands thoroughly throughout the day as this is the number one way to prevent the spread of infection!  Home Care: Only take medications as instructed by your medical team. Complete the entire course of an antibiotic. Do not take these medications with alcohol. A steam or ultrasonic humidifier can help congestion.  You can place a towel over your head and breathe in the steam from hot water coming from a faucet. Avoid close contacts especially the very young and the elderly. Cover your mouth when you cough or sneeze. Always remember to wash your hands.  Get Help Right Away If: You develop worsening fever or sinus pain. You develop a severe head ache or visual changes. Your  symptoms persist after you have completed your treatment plan.  Make sure you Understand these instructions. Will watch your condition. Will get help right away if you are not doing well or get worse.  Thank you for choosing an e-visit.  Your e-visit answers were reviewed by a board certified advanced clinical practitioner to complete your personal care plan. Depending upon the condition, your plan could have included both over the counter or prescription medications.  Please review your pharmacy choice. Make sure the pharmacy is open so you can pick up prescription now. If there is a problem, you may contact your provider through [employer] and have the prescription routed to another pharmacy.  Your safety is important to [place]. If you have drug allergies check your prescription carefully.   For the next 24 hours you can use MyChart to ask questions about today's visit, request a non-urgent call back, or ask for a work or school excuse. You will get an email in the next two days asking about your experience. I hope that your e-visit has been valuable and will speed your recovery.

## 2023-08-04 NOTE — Progress Notes (Signed)
I have spent 5 minutes in review of e-visit questionnaire, review and updating patient chart, medical decision making and response to patient.  ° °Jerrell Mangel W Secilia Apps, NP ° °  °

## 2023-08-05 ENCOUNTER — Ambulatory Visit
Admission: RE | Admit: 2023-08-05 | Discharge: 2023-08-05 | Disposition: A | Discharge status: Home / Self Care | Payer: No Typology Code available for payment source | Source: Ambulatory Visit

## 2023-08-05 DIAGNOSIS — Z1231 Encounter for screening mammogram for malignant neoplasm of breast: Secondary | ICD-10-CM

## 2023-08-08 ENCOUNTER — Other Ambulatory Visit: Payer: Self-pay | Admitting: Neurology

## 2023-08-08 ENCOUNTER — Encounter: Payer: Self-pay | Admitting: Neurology

## 2023-08-08 ENCOUNTER — Ambulatory Visit (INDEPENDENT_AMBULATORY_CARE_PROVIDER_SITE_OTHER): Payer: No Typology Code available for payment source | Admitting: Neurology

## 2023-08-08 VITALS — BP 96/68 | HR 94 | Ht 64.0 in | Wt 129.0 lb

## 2023-08-08 DIAGNOSIS — R269 Unspecified abnormalities of gait and mobility: Secondary | ICD-10-CM

## 2023-08-08 DIAGNOSIS — G250 Essential tremor: Secondary | ICD-10-CM

## 2023-08-08 DIAGNOSIS — G35 Multiple sclerosis: Secondary | ICD-10-CM

## 2023-08-08 DIAGNOSIS — D134 Benign neoplasm of liver: Secondary | ICD-10-CM

## 2023-08-08 DIAGNOSIS — G43109 Migraine with aura, not intractable, without status migrainosus: Secondary | ICD-10-CM | POA: Diagnosis not present

## 2023-08-08 DIAGNOSIS — R292 Abnormal reflex: Secondary | ICD-10-CM

## 2023-08-08 MED ORDER — TIZANIDINE HCL 4 MG PO CAPS: 4.0000 mg | ORAL_CAPSULE | Freq: Three times a day (TID) | ORAL | 3 refills | Status: DC

## 2023-08-08 MED ORDER — GABAPENTIN 600 MG PO TABS: ORAL_TABLET | ORAL | 3 refills | Status: DC

## 2023-08-08 MED ORDER — QULIPTA 60 MG PO TABS: ORAL_TABLET | ORAL | 3 refills | Status: DC

## 2023-08-08 NOTE — Progress Notes (Addendum)
[facility]  PATIENT: [name] DOB: Sep 26, 1975  REFERRING DOCTOR OR PCP: Helane Rima, DO SOURCE: Patient, notes from [facility] neurology, imaging and lab reports, several MRIs of the brain personally reviewed  _________________________________   HISTORICAL  CHIEF COMPLAINT:  Chief Complaint  Patient presents with   Follow-up    Pt in room 10 alone. Here for MS follow up. Pt reports doing well. Pt said injections are working well. Pt reports tremors in hands for about 1 year, forgot to mention at last visit. Someday's are worse than others.     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  Update 08/08/2023 She started glatiramer 40 mg three times per week and tolerates the injections well.     She has mild skin reactions for a couple days only.    She has no new exacerbation or   She has neck pain,  LBP and mid back - worse with standing.   She notes a benefit from gabapentin   She had MBB/RFA in her neck with benefit  She is on [place] with benefit.   She had milder benefit from topiramate.    [name] helps when one occurs  She notes a tremor in both hands.   It worsens with intention.  No known FH but she does not know dad's FH well.   She is walking well on flat surface but uses bannister on stairs.  Balance is mildly off.   Strength and sensation are fine.   Bladder function shows some hesitancy.   Vision is fine.     She has some fatigue daily.    This was worse in heat.    She has noted cognitive issues including reduced focus at work and word finding - started in 2023.   She has some depression and takes Wellbutrin.  She has some anxiety.     She is otherwise healthy.   Vascular risks:   No tobacco or ETOH.  No HTN, DM, hyperlipidemia.    No cardiac issues.     She works from home.      MS History: In 2005, she had ataxia veering to the rihgt when she was walking.   She was not dizzy but could  not walk straight.   A brain MRI in showed one subtle right periventricular focus.   She received 3 days of IV steroid.   She got completely back to baseline by discharge.   She has had some sensoy fluctuation in her hands with numbness lasting minutes but not days.    She also has had soe fluctuations in her vision.   The spinal cord showed no lesions.  She has scattered brain lesions and there was one additional one in 2024 compared to previous MRI.  I had spoken with her - between her clinical history, CSF showing borderline number of OCB (3), MRI showing periventricular and juxtacortical lesions, new MRI with additional lesion that I believe she likely has MS.       Imaging: MRI of the brain 08/29/2004 shows a subtle right periventricular focus.  MRI of the brain 08/13/2016 shows a T2/flair hyperintense focus in the juxtacortical white matter of the posterior left frontal lobe and 2 small periventricular foci and 2 or 3 punctate foci in the deep white matter.  MRI of the brain 05/05/2017 showed no new lesions.  CSF 04/19/2017 showed 3 oligoclonal bands seen in the CSF not detected  in the cervical (criteria was 4)  MRI cervical and thoracic spine 06/19/2022 showed a normal spinal cord, mild cervical DJD, thyroid nodule and 12 cmliver mass  MRI brain 06/19/2022 showed 1 new lesion compared to 2018  REVIEW OF SYSTEMS: Constitutional: No fevers, chills, sweats, or change in appetite Eyes: No visual changes, double vision, eye pain Ear, nose and throat: No hearing loss, ear pain, nasal congestion, sore throat Cardiovascular: No chest pain, palpitations Respiratory:  No shortness of breath at rest or with exertion.   No wheezes GastrointestinaI: No nausea, vomiting, diarrhea, abdominal pain, fecal incontinence Genitourinary:  No dysuria, urinary retention or frequency.  No nocturia. Musculoskeletal: She has some neck and back pain.  She had a right frozen shoulder and needed surgery in 12/2019.  She  did Rehab.   She now has a left frozen that started in December 2022.  She is getting cortisone shots.    Integumentary: No rash, pruritus, skin lesions Neurological: as above Psychiatric: No depression at this time.  No anxiety Endocrine: No palpitations, diaphoresis, change in appetite, change in weigh or increased thirst Hematologic/Lymphatic:  No anemia, purpura, petechiae. Allergic/Immunologic: No itchy/runny eyes, nasal congestion, recent allergic reactions, rashes  ALLERGIES: Allergies  Allergen Reactions   Codeine Nausea Only   Other Other (See Comments)    Anxious - couldn't sit still (brompheniramine DM)    HOME MEDICATIONS:  Current Outpatient Medications:    DULoxetine (CYMBALTA) 60 MG capsule, Take 60 mg by mouth daily., Disp: , Rfl:    Glatiramer Acetate 40 MG/ML SOSY, Inject 40 mg into the skin 3 (three) times a week., Disp: , Rfl:    ibuprofen (ADVIL) 200 MG tablet, Take 200 mg by mouth every 6 (six) hours as needed. 3x daily, Disp: , Rfl:    meloxicam (MOBIC) 15 MG tablet, Take 1 tablet by mouth daily., Disp: 90 tablet, Rfl: 1   ondansetron (ZOFRAN) 4 MG tablet, Take 1 tablet (4 mg total) by mouth every 8 (eight) hours as needed for nausea or vomiting., Disp: 20 tablet, Rfl: 0   promethazine (PHENERGAN) 25 MG tablet, Take 1 tablet (25 mg total) by mouth every 12 (twelve) hours as needed for nausea or vomiting., Disp: 20 tablet, Rfl: 0   scopolamine (TRANSDERM-SCOP) 1 MG/3DAYS, Place 1 patch onto the skin every 3 days as directed., Disp: 10 patch, Rfl: 12   spironolactone (ALDACTONE) 50 MG tablet, Take 50 mg by mouth daily., Disp: , Rfl:    amoxicillin-clavulanate (AUGMENTIN) 875-125 MG tablet, Take 1 tablet by mouth 2 (two) times daily., Disp: 14 tablet, Rfl: 0   Atogepant (QULIPTA) 60 MG TABS, One po qd, Disp: 90 tablet, Rfl: 3   buPROPion (WELLBUTRIN XL) 300 MG 24 hr tablet, Take 1 tablet by mouth daily, Disp: 90 tablet, Rfl: 3   esomeprazole (NEXIUM) 40 MG capsule,  Take 1 capsule (40 mg total) by mouth daily., Disp: 90 capsule, Rfl: 0   gabapentin (NEURONTIN) 600 MG tablet, Take 600mg  in the morning, 600mg  in the afternoon and 1200mg  at bedtime, Disp: 360 tablet, Rfl: 3   tiZANidine (ZANAFLEX) 4 MG capsule, Take 1 capsule (4 mg total) by mouth 3 (three) times daily., Disp: 270 capsule, Rfl: 3   Ubrogepant (UBRELVY) 50 MG TABS, Take 50 mg by mouth as needed. Take as needed, Disp: , Rfl:   PAST MEDICAL HISTORY: Past Medical History:  Diagnosis Date   Anxiety    Carpal tunnel syndrome    Chronic lower back pain  Chronic tension headaches    Constipation    Dysmenorrhea    Endometrial polyp    Fatigue    Frozen shoulder    GERD (gastroesophageal reflux disease)    History of lumbar puncture 08/2016   HPV in female    Low serum vitamin D    Neck pain, chronic    Pelvic pain    Thyroid nodule    Tinnitus, bilateral    TMJ (temporomandibular joint disorder)    White matter abnormality on MRI of brain     PAST SURGICAL HISTORY: Past Surgical History:  Procedure Laterality Date   DE QUERVAIN'S RELEASE Bilateral    DILATATION & CURETTAGE/HYSTEROSCOPY WITH MYOSURE N/A 10/24/2017   Procedure: DILATATION & CURETTAGE/HYSTEROSCOPY WITH MYOSURE;  Surgeon: Romualdo Bolk, MD;  Location: [facility];  Service: Gynecology;  Laterality: N/A;   IUD REMOVAL N/A 10/24/2017   Procedure: INTRAUTERINE DEVICE (IUD) REMOVAL;  Surgeon: Romualdo Bolk, MD;  Location: [facility];  Service: Gynecology;  Laterality: N/A;   TONSILLECTOMY     WISDOM TOOTH EXTRACTION     WRIST SURGERY Bilateral    2 ganglion cyst removed    FAMILY HISTORY: Family History  Problem Relation Age of Onset   ADD / ADHD Mother    Colon polyps Mother    Colon polyps Father    Colon cancer Maternal Grandmother    Colon polyps Maternal Grandfather    Diabetes Maternal Grandfather    Colon polyps Paternal Grandmother    Colon polyps Paternal  Grandfather     SOCIAL HISTORY: Social History   Socioeconomic History   Marital status: Divorced    Spouse name: Will   Number of children: 1   Years of education: Not on file   Highest education level: Not on file  Occupational History    Employer: [employer]   Occupation: CMA  Tobacco Use   Smoking status: Former    Current packs/day: 0.00    Average packs/day: 18.0 packs/day for 1 year (18.0 ttl pk-yrs)    Types: Cigarettes    Start date: 12/30/2011    Quit date: 12/29/2012    Years since quitting: 10.6   Smokeless tobacco: Never  Vaping Use   Vaping status: Never Used  Substance and Sexual Activity   Alcohol use: Yes    Alcohol/week: 1.0 - 2.0 standard drink of alcohol    Types: 1 - 2 Standard drinks or equivalent per week   Drug use: No   Sexual activity: Yes    Partners: Male    Birth control/protection: Pill  Other Topics Concern   Not on file  Social History Narrative   Not on file   Social Determinants of Health   Financial Resource Strain: Low Risk  (10/07/2022)   Received from [facility], [facility]   Overall Financial Resource Strain (CARDIA)    Difficulty of Paying Living Expenses: Not hard at all  Food Insecurity: No Food Insecurity (10/07/2022)   Received from [facility], [facility]   Hunger Vital Sign    Worried About Running Out of Food in the Last Year: Never true    Ran Out of Food in the Last Year: Never true  Transportation Needs: No Transportation Needs (10/07/2022)   Received from [facility], [facility]   [facility] - Transportation    In the past 12 months, has lack of transportation kept you from  medical appointments or from getting medications?: No    Lack of Transportation (Non-Medical): No  Physical Activity: Not on file  Stress: Not on file  Social Connections: Not on file  Intimate Partner Violence: Not on file        PHYSICAL EXAM  Vitals:   08/08/23 0821  BP: 96/68  Pulse: 94  Weight: 129 lb (58.5 kg)  Height: 5\' 4"  (1.626 m)    Body mass index is 22.14 kg/m.   No results found.   General: The patient is well-developed and well-nourished and in no acute distress  HEENT:  Head is [place]/AT.  Sclera are anicteric.      Skin: Extremities are without rash or  edema.  Musculoskeletal:  Back is nontender  Neurologic Exam  Mental status: The patient is alert and oriented x 3 at the time of the examination. The patient has apparent normal recent and remote memory, with an apparently normal attention span and concentration ability.   Speech is normal.  Cranial nerves: Extraocular movements are full.  COlor vision is symmetric.Facial symmetry is present. There is good facial sensation to soft touch bilaterally.Facial strength is normal.  Trapezius and sternocleidomastoid strength is normal. No dysarthria is noted.  The tongue is midline, and the patient has symmetric elevation of the soft palate. No obvious hearing deficits are noted.  Motor: She has a 6 to 7 Hz low amplitude tremor that worsens with intention.  Muscle bulk is normal.   Tone is normal. Strength is  5 / 5 in all 4 extremities.   Sensory: Sensory testing is intact to pinprick, soft touch and vibration sensation in all 4 extremities.  Coordination: Cerebellar testing reveals good finger-nose-finger and heel-to-shin bilaterally.  Gait and station: Station is normal.   Gait is normal. Tandem gait is slightly wide. Romberg is negative  Reflexes: Deep tendon reflexes are symmetric and normal in arms and 3 at knees, 2 at ankles.       DIAGNOSTIC DATA (LABS, IMAGING, TESTING) - I reviewed patient records, labs, notes, testing and imaging myself where available.  Lab Results  Component Value Date   WBC 12.1 (H) 04/18/2023   HGB 12.8 04/18/2023   HCT 38.0 04/18/2023   MCV 93.1 04/18/2023   PLT 233 04/18/2023       Component Value Date/Time   NA 137 04/18/2023 1453   NA 142 10/30/2020 0807   K 4.1 04/18/2023 1453   CL 106 04/18/2023 1453   CO2 21 (L) 04/18/2023 1453   GLUCOSE 98 04/18/2023 1453   BUN 24 (H) 04/18/2023 1453   BUN 11 10/30/2020 0807   CREATININE 0.84 04/18/2023 1453   CALCIUM 9.5 04/18/2023 1453   PROT 6.8 04/18/2023 1453   PROT 7.1 10/30/2020 0807   ALBUMIN 4.5 04/18/2023 1453   ALBUMIN 4.8 10/30/2020 0807   AST 12 (L) 04/18/2023 1453   ALT 10 04/18/2023 1453   ALKPHOS 89 04/18/2023 1453   BILITOT 0.2 (L) 04/18/2023 1453   BILITOT <0.2 10/30/2020 0807   GFRNONAA >60 04/18/2023 1453   GFRAA 125 10/30/2020 0807   Lab Results  Component Value Date   CHOL 212 (H) 11/07/2020   HDL 59.40 11/07/2020   LDLCALC 124 (H) 11/07/2020   TRIG 141.0 11/07/2020   CHOLHDL 4 11/07/2020   Lab Results  Component Value Date   HGBA1C 5.6 05/16/2019   Lab Results  Component Value Date   VITAMINB12 350 06/10/2022   Lab Results  Component Value Date  TSH 3.60 11/07/2020       ASSESSMENT AND PLAN  Multiple sclerosis (HCC)  Gait disturbance  Migraine with aura and without status migrainosus, not intractable  Leiomyoma of liver  Hyperreflexia   Continue glatiramer.   Renew tizanidine, gabapentin, Qulipta We discussed BET and weighted fat utensils/pen.  If worsens, consider a B-blocker. Rtc 6 months, sooner if new or      Gursimran Litaker A. Epimenio Foot, MD, [facility] 08/08/2023, 9:06 AM Certified in Neurology, Clinical Neurophysiology, Sleep Medicine and Neuroimaging  [facility] [address] [place], [place] [address] [phone]

## 2023-08-31 ENCOUNTER — Encounter: Payer: Self-pay | Admitting: Neurology

## 2023-09-01 ENCOUNTER — Other Ambulatory Visit: Payer: Self-pay | Admitting: Neurology

## 2023-09-01 MED ORDER — PROPRANOLOL HCL ER 60 MG PO CP24: 60.0000 mg | ORAL_CAPSULE | Freq: Every day | ORAL | 5 refills | Status: DC

## 2023-09-15 ENCOUNTER — Encounter: Payer: Self-pay | Admitting: Neurology

## 2023-09-15 DIAGNOSIS — G35 Multiple sclerosis: Secondary | ICD-10-CM

## 2023-09-20 NOTE — Telephone Encounter (Signed)
 Dr. Epimenio Foot reviewed and signed [employer] PAP form for glatiramer. I called patient. Will fax to her secure fax for her signature and then she will fax to [employer] P[employer].

## 2023-09-26 MED ORDER — GLATIRAMER ACETATE 40 MG/ML ~~LOC~~ SOSY
40.0000 mg | PREFILLED_SYRINGE | SUBCUTANEOUS | 11 refills | Status: DC
Start: 2023-09-26 — End: 2023-10-19

## 2023-09-26 NOTE — Addendum Note (Signed)
 Addended by: Arther Abbott on: 09/26/2023 10:58 AM   Modules accepted: Orders

## 2023-09-26 NOTE — Telephone Encounter (Signed)
 Called [employer] and spoke w/ Sophia. They need shipping exception form and printed/signed rx faxed to them. She will fax form needing completion to me at [phone]

## 2023-09-30 ENCOUNTER — Telehealth: Payer: No Typology Code available for payment source | Admitting: Physician Assistant

## 2023-09-30 DIAGNOSIS — J069 Acute upper respiratory infection, unspecified: Secondary | ICD-10-CM

## 2023-09-30 DIAGNOSIS — B9689 Other specified bacterial agents as the cause of diseases classified elsewhere: Secondary | ICD-10-CM | POA: Diagnosis not present

## 2023-09-30 MED ORDER — AMOXICILLIN-POT CLAVULANATE 875-125 MG PO TABS: 1.0000 | ORAL_TABLET | Freq: Two times a day (BID) | ORAL | 0 refills | Status: DC

## 2023-09-30 NOTE — Progress Notes (Signed)

## 2023-10-05 ENCOUNTER — Encounter: Payer: Self-pay | Admitting: *Deleted

## 2023-10-05 ENCOUNTER — Encounter: Payer: Self-pay | Admitting: Neurology

## 2023-10-05 ENCOUNTER — Telehealth (INDEPENDENT_AMBULATORY_CARE_PROVIDER_SITE_OTHER): Payer: No Typology Code available for payment source | Admitting: Neurology

## 2023-10-05 DIAGNOSIS — R292 Abnormal reflex: Secondary | ICD-10-CM

## 2023-10-05 DIAGNOSIS — G35 Multiple sclerosis: Secondary | ICD-10-CM | POA: Diagnosis not present

## 2023-10-05 DIAGNOSIS — G43109 Migraine with aura, not intractable, without status migrainosus: Secondary | ICD-10-CM | POA: Diagnosis not present

## 2023-10-05 DIAGNOSIS — G250 Essential tremor: Secondary | ICD-10-CM

## 2023-10-05 DIAGNOSIS — R269 Unspecified abnormalities of gait and mobility: Secondary | ICD-10-CM

## 2023-10-05 DIAGNOSIS — H539 Unspecified visual disturbance: Secondary | ICD-10-CM

## 2023-10-05 MED ORDER — LAMOTRIGINE 25 MG PO TABS: ORAL_TABLET | ORAL | 11 refills | Status: DC

## 2023-10-05 NOTE — Telephone Encounter (Signed)
See mychart message from 10/05/2023

## 2023-10-05 NOTE — Progress Notes (Signed)
[facility]  PATIENT: [name] DOB: 1976/01/21  REFERRING DOCTOR OR PCP: Helane Rima, DO SOURCE: Patient, notes from [facility] neurology, imaging and lab reports, several MRIs of the brain personally reviewed  _________________________________   HISTORICAL  CHIEF COMPLAINT:  Chief Complaint  Patient presents with   Multiple Sclerosis   Virtual Visit via Video Note I connected with [name] on 10/05/23 at 10:00 AM EST by a video enabled telemedicine application and verified that I am speaking with the correct person.  I discussed the limitations of evaluation and management by telemedicine and the availability of in person appointments. The patient expressed understanding and agreed to proceed.  Patient at work/office   Provider in office   HISTORY OF PRESENT ILLNESS:  [name] is a 48 y.o. woman with multiple sclerosis.  Update 10/05/2023 She started glatiramer 40 mg three times per week and tolerated the injections well.   Unfortunately [facility] has a $1000 monthly copay.  We discussed options.    She has mild skin reactions for a couple days only.    She has no new exacerbation but has more sensory symptoms.    She has aching pain in both legs.   Nothing makes it better or worse.   She is on gabapentin [phone] mg with some benefit,   She has neck pain,  LBP and mid back - worse with standing.   She notes a benefit from gabapentin   She had MBB/RFA in her neck with benefit  Migraines have done well on Qulipta and she tolerates it well.   She had milder benefit from topiramate.    [name] helps when one occurs  She notes a tremor in both hands.   It worsens with intention.  No known FH but she does not know dad's FH well.    Propranolol has helped.     Gait is doing well.   She uses bannister on stairs.  Balance is mildly off.   Strength and sensation are fine.   Bladder function shows some hesitancy.   Vision is fine.     She has some  fatigue daily.    This was worse in heat.    She has noted cognitive issues including reduced focus at work and word finding - started in 2023.   She has some depression and takes Wellbutrin.  She has some anxiety.     She is otherwise healthy.   Vascular risks:   No tobacco or ETOH.  No HTN, DM, hyperlipidemia.    No cardiac issues.        PHYSICAL: 100/63 pulse 73 She is a well-developed well-nourished woman in no acute distress.  The head is normocephalic and atraumatic.  Sclera are anicteric.  Visible skin appears normal.  The neck has a good range of motion.    She is alert and fully oriented with fluent speech and good attention, knowledge and memory.  Extraocular muscles are intact.  Facial strength is normal.  Palatal elevation and tongue protrusion are midline.  She appears to have normal strength in the arms.  Rapid alternating movements and finger-nose-finger are performed well.  MS History: In 2005, she had ataxia veering to the rihgt when she was walking.   She was not dizzy but could not walk straight.   A brain MRI in showed one subtle right periventricular focus.   She received 3 days of IV steroid.   She got completely back to baseline by discharge.  She has had some sensoy fluctuation in her hands with numbness lasting minutes but not days.    She also has had soe fluctuations in her vision.   The spinal cord showed no lesions.  She has scattered brain lesions and there was one additional one in 2024 compared to previous MRI.  I had spoken with her - between her clinical history, CSF showing borderline number of OCB (3), MRI showing periventricular and juxtacortical lesions, new MRI with additional lesion that I believe she likely has MS.      She has been on glatiramer 09/2022   Imaging: MRI of the brain 08/29/2004 shows a subtle right periventricular focus.  MRI of the brain 08/13/2016 shows a T2/flair hyperintense focus in the juxtacortical white matter of the posterior left  frontal lobe and 2 small periventricular foci and 2 or 3 punctate foci in the deep white matter.  MRI of the brain 05/05/2017 showed no new lesions.  CSF 04/19/2017 showed 3 oligoclonal bands seen in the CSF not detected in the cervical (criteria was 4)  MRI cervical and thoracic spine 06/19/2022 showed a normal spinal cord, mild cervical DJD, thyroid nodule and 12 cmliver mass  MRI brain 06/19/2022 showed 1 new lesion compared to 2018  REVIEW OF SYSTEMS: Constitutional: No fevers, chills, sweats, or change in appetite Eyes: No visual changes, double vision, eye pain Ear, nose and throat: No hearing loss, ear pain, nasal congestion, sore throat Cardiovascular: No chest pain, palpitations Respiratory:  No shortness of breath at rest or with exertion.   No wheezes GastrointestinaI: No nausea, vomiting, diarrhea, abdominal pain, fecal incontinence Genitourinary:  No dysuria, urinary retention or frequency.  No nocturia. Musculoskeletal: She has some neck and back pain.  She had a right frozen shoulder and needed surgery in 12/2019.  She did Rehab.   She now has a left frozen that started in December 2022.  She is getting cortisone shots.    Integumentary: No rash, pruritus, skin lesions Neurological: as above Psychiatric: No depression at this time.  No anxiety Endocrine: No palpitations, diaphoresis, change in appetite, change in weigh or increased thirst Hematologic/Lymphatic:  No anemia, purpura, petechiae. Allergic/Immunologic: No itchy/runny eyes, nasal congestion, recent allergic reactions, rashes  ALLERGIES: Allergies  Allergen Reactions   Codeine Nausea Only   Other Other (See Comments)    Anxious - couldn't sit still (brompheniramine DM)    HOME MEDICATIONS:  Current Outpatient Medications:    lamoTRIgine (LAMICTAL) 25 MG tablet, One po every day x 5 d, then one po bid x 5d, then one po qAM and two po qPM x 5 d, then Two po bid, Disp: 120 tablet, Rfl: 11    amoxicillin-clavulanate (AUGMENTIN) 875-125 MG tablet, Take 1 tablet by mouth 2 (two) times daily., Disp: 14 tablet, Rfl: 0   Atogepant (QULIPTA) 60 MG TABS, One po qd, Disp: 90 tablet, Rfl: 3   buPROPion (WELLBUTRIN XL) 300 MG 24 hr tablet, Take 1 tablet by mouth daily, Disp: 90 tablet, Rfl: 3   DULoxetine (CYMBALTA) 60 MG capsule, Take 60 mg by mouth daily., Disp: , Rfl:    esomeprazole (NEXIUM) 40 MG capsule, Take 1 capsule (40 mg total) by mouth daily., Disp: 90 capsule, Rfl: 0   gabapentin (NEURONTIN) 600 MG tablet, Take 600mg  in the morning, 600mg  in the afternoon and 1200mg  at bedtime, Disp: 360 tablet, Rfl: 3   Glatiramer Acetate 40 MG/ML SOSY, Inject 40 mg into the skin 3 (three) times a week., Disp: 12  mL, Rfl: 11   ibuprofen (ADVIL) 200 MG tablet, Take 200 mg by mouth every 6 (six) hours as needed. 3x daily, Disp: , Rfl:    meloxicam (MOBIC) 15 MG tablet, Take 1 tablet by mouth daily., Disp: 90 tablet, Rfl: 1   ondansetron (ZOFRAN) 4 MG tablet, Take 1 tablet (4 mg total) by mouth every 8 (eight) hours as needed for nausea or vomiting., Disp: 20 tablet, Rfl: 0   promethazine (PHENERGAN) 25 MG tablet, Take 1 tablet (25 mg total) by mouth every 12 (twelve) hours as needed for nausea or vomiting., Disp: 20 tablet, Rfl: 0   propranolol ER (INDERAL LA) 60 MG 24 hr capsule, Take 1 capsule (60 mg total) by mouth daily., Disp: 30 capsule, Rfl: 5   scopolamine (TRANSDERM-SCOP) 1 MG/3DAYS, Place 1 patch onto the skin every 3 days as directed., Disp: 10 patch, Rfl: 12   spironolactone (ALDACTONE) 50 MG tablet, Take 50 mg by mouth daily., Disp: , Rfl:    tiZANidine (ZANAFLEX) 4 MG capsule, Take 1 capsule (4 mg total) by mouth 3 (three) times daily., Disp: 270 capsule, Rfl: 3   Ubrogepant (UBRELVY) 50 MG TABS, Take 50 mg by mouth as needed. Take as needed, Disp: , Rfl:   PAST MEDICAL HISTORY: Past Medical History:  Diagnosis Date   Anxiety    Carpal tunnel syndrome    Chronic lower back pain     Chronic tension headaches    Constipation    Dysmenorrhea    Endometrial polyp    Fatigue    Frozen shoulder    GERD (gastroesophageal reflux disease)    History of lumbar puncture 08/2016   HPV in female    Low serum vitamin D    Neck pain, chronic    Pelvic pain    Thyroid nodule    Tinnitus, bilateral    TMJ (temporomandibular joint disorder)    White matter abnormality on MRI of brain     PAST SURGICAL HISTORY: Past Surgical History:  Procedure Laterality Date   DE QUERVAIN'S RELEASE Bilateral    DILATATION & CURETTAGE/HYSTEROSCOPY WITH MYOSURE N/A 10/24/2017   Procedure: DILATATION & CURETTAGE/HYSTEROSCOPY WITH MYOSURE;  Surgeon: Romualdo Bolk, MD;  Location: [facility];  Service: Gynecology;  Laterality: N/A;   IUD REMOVAL N/A 10/24/2017   Procedure: INTRAUTERINE DEVICE (IUD) REMOVAL;  Surgeon: Romualdo Bolk, MD;  Location: [facility];  Service: Gynecology;  Laterality: N/A;   TONSILLECTOMY     WISDOM TOOTH EXTRACTION     WRIST SURGERY Bilateral    2 ganglion cyst removed    FAMILY HISTORY: Family History  Problem Relation Age of Onset   ADD / ADHD Mother    Colon polyps Mother    Colon polyps Father    Colon cancer Maternal Grandmother    Colon polyps Maternal Grandfather    Diabetes Maternal Grandfather    Colon polyps Paternal Grandmother    Colon polyps Paternal Grandfather     SOCIAL HISTORY: Social History   Socioeconomic History   Marital status: Divorced    Spouse name: Will   Number of children: 1   Years of education: Not on file   Highest education level: Not on file  Occupational History    Employer: [employer]   Occupation: CMA  Tobacco Use   Smoking status: Former    Current packs/day: 0.00    Average packs/day: 18.0 packs/day for 1 year (18.0 ttl pk-yrs)    Types: Cigarettes  Start date: 12/30/2011    Quit date: 12/29/2012    Years since quitting: 10.7   Smokeless tobacco: Never  Vaping  Use   Vaping status: Never Used  Substance and Sexual Activity   Alcohol use: Yes    Alcohol/week: 1.0 - 2.0 standard drink of alcohol    Types: 1 - 2 Standard drinks or equivalent per week   Drug use: No   Sexual activity: Yes    Partners: Male    Birth control/protection: Pill  Other Topics Concern   Not on file  Social History Narrative   Not on file   Social Drivers of Health   Financial Resource Strain: Low Risk  (10/07/2022)   Received from [facility], [facility]   Overall Financial Resource Strain (CARDIA)    Difficulty of Paying Living Expenses: Not hard at all  Food Insecurity: No Food Insecurity (10/07/2022)   Received [facility], [facility]   Hunger Vital Sign    Worried About Running Out of Food in the Last Year: Never true    Ran Out of Food in the Last Year: Never true  Transportation Needs: No Transportation Needs (10/07/2022)   Received from [facility], [facility]   [facility] - Transportation    In the past 12 months, has lack of transportation kept you from medical appointments or from getting medications?: No    Lack of Transportation (Non-Medical): No  Physical Activity: Not on file  Stress: Not on file  Social Connections: Not on file  Intimate Partner Violence: Not on file       PHYSICAL EXAM  There were no vitals filed for this visit.   There is no height or weight on file to calculate BMI.   No results found.   General: The patient is well-developed and well-nourished and in no acute distress  HEENT:  Head is [place]/AT.  Sclera are anicteric.      Skin: Extremities are without rash or  edema.  Musculoskeletal:  Back is nontender  Neurologic Exam  Mental status: The patient is alert and oriented x 3 at the time of the examination. The patient has apparent normal recent and remote memory, with an apparently normal attention  span and concentration ability.   Speech is normal.  Cranial nerves: Extraocular movements are full.  Color vision is symmetric.  Facial symmetry is present. There is good facial sensation to soft touch bilaterally.Facial strength is normal.  Trapezius and sternocleidomastoid strength is normal. No dysarthria is noted.  The tongue is midline, and the patient has symmetric elevation of the soft palate. No obvious hearing deficits are noted.  Motor: She has a 6 to 7 Hz low amplitude tremor that worsens with intention.  Muscle bulk is normal.   Tone is normal. Strength is  5 / 5 in all 4 extremities.   Sensory: Sensory testing is intact to pinprick, soft touch and vibration sensation in all 4 extremities.  Coordination: Cerebellar testing reveals good finger-nose-finger and heel-to-shin bilaterally.  Gait and station: Station is normal.   Gait is normal. Tandem gait is slightly wide. Romberg is negative  Reflexes: Deep tendon reflexes are symmetric and normal in arms and 3 at knees, 2 at ankles.       DIAGNOSTIC DATA (LABS, IMAGING, TESTING) - I reviewed patient records, labs, notes, testing and imaging myself where available.  Lab Results  Component Value Date   WBC 12.1 (  H) 04/18/2023   HGB 12.8 04/18/2023   HCT 38.0 04/18/2023   MCV 93.1 04/18/2023   PLT 233 04/18/2023      Component Value Date/Time   NA 137 04/18/2023 1453   NA 142 10/30/2020 0807   K 4.1 04/18/2023 1453   CL 106 04/18/2023 1453   CO2 21 (L) 04/18/2023 1453   GLUCOSE 98 04/18/2023 1453   BUN 24 (H) 04/18/2023 1453   BUN 11 10/30/2020 0807   CREATININE 0.84 04/18/2023 1453   CALCIUM 9.5 04/18/2023 1453   PROT 6.8 04/18/2023 1453   PROT 7.1 10/30/2020 0807   ALBUMIN 4.5 04/18/2023 1453   ALBUMIN 4.8 10/30/2020 0807   AST 12 (L) 04/18/2023 1453   ALT 10 04/18/2023 1453   ALKPHOS 89 04/18/2023 1453   BILITOT 0.2 (L) 04/18/2023 1453   BILITOT <0.2 10/30/2020 0807   GFRNONAA >60 04/18/2023 1453   GFRAA 125  10/30/2020 0807   Lab Results  Component Value Date   CHOL 212 (H) 11/07/2020   HDL 59.40 11/07/2020   LDLCALC 124 (H) 11/07/2020   TRIG 141.0 11/07/2020   CHOLHDL 4 11/07/2020   Lab Results  Component Value Date   HGBA1C 5.6 05/16/2019   Lab Results  Component Value Date   VITAMINB12 350 06/10/2022   Lab Results  Component Value Date   TSH 3.60 11/07/2020       ASSESSMENT AND PLAN  Multiple sclerosis (HCC)  Gait disturbance  Migraine with aura and without status migrainosus, not intractable  Essential tremor  Hyperreflexia  Vision disturbance   Change to V[facility] due to very high copay on glatiramer.    Continue tizanidine, gabapentin, Qulipta Continue propranolol for BET and weighted fat utensils/pen. . Rtc 6 months, sooner if new or    Follow Up Instructions: I discussed the assessment and treatment plan with the patient. The patient was provided an opportunity to ask questions and all were answered. The patient agreed with the plan and demonstrated an understanding of the instructions.    The patient was advised to call back or seek an in-person evaluation if the symptoms worsen or if the condition fails to improve as anticipated.  I provided 28 minutes of non-face-to-face time during this encounter.   Rashika Bettes A. Epimenio Foot, MD, [facility] 10/05/2023, 5:59 PM Certified in Neurology, Clinical Neurophysiology, Sleep Medicine and Neuroimaging  [facility] [address], [address] [place], [place] [address] [phone]

## 2023-10-06 ENCOUNTER — Telehealth: Payer: Self-pay | Admitting: *Deleted

## 2023-10-06 NOTE — Telephone Encounter (Signed)
Faxed completed/signed Vumerity start form to [employer] at [phone]. Received fax confirmation.  

## 2023-10-10 NOTE — Telephone Encounter (Signed)
PA for Vumerity sent to [facility] via CMM. Should have a determination within 3-5 business days. Key: [id number].

## 2023-10-19 NOTE — Addendum Note (Signed)
 Addended by: Norina Beath A on: 10/19/2023 04:13 PM   Modules accepted: Orders

## 2023-10-30 ENCOUNTER — Encounter: Payer: Self-pay | Admitting: Neurology

## 2023-11-08 ENCOUNTER — Other Ambulatory Visit: Payer: Self-pay

## 2023-11-08 MED ORDER — LAMOTRIGINE 25 MG PO TABS: ORAL_TABLET | ORAL | 11 refills | Status: DC

## 2023-11-08 NOTE — Telephone Encounter (Signed)
 Looks like the rx should be changed to then Two po bid is that ok? Or increase to 50 bid needs to be sent to [facility]

## 2023-11-11 ENCOUNTER — Encounter: Payer: Self-pay | Admitting: Neurology

## 2023-11-15 ENCOUNTER — Emergency Department (HOSPITAL_BASED_OUTPATIENT_CLINIC_OR_DEPARTMENT_OTHER)
Admission: EM | Admit: 2023-11-15 | Discharge: 2023-11-15 | Disposition: A | Discharge status: Home / Self Care | Attending: Emergency Medicine | Admitting: Emergency Medicine

## 2023-11-15 ENCOUNTER — Emergency Department (HOSPITAL_BASED_OUTPATIENT_CLINIC_OR_DEPARTMENT_OTHER)

## 2023-11-15 ENCOUNTER — Other Ambulatory Visit: Payer: Self-pay

## 2023-11-15 ENCOUNTER — Encounter (HOSPITAL_BASED_OUTPATIENT_CLINIC_OR_DEPARTMENT_OTHER): Payer: Self-pay | Admitting: Emergency Medicine

## 2023-11-15 DIAGNOSIS — R1011 Right upper quadrant pain: Secondary | ICD-10-CM | POA: Diagnosis present

## 2023-11-15 DIAGNOSIS — R110 Nausea: Secondary | ICD-10-CM | POA: Diagnosis not present

## 2023-11-15 DIAGNOSIS — Z79899 Other long term (current) drug therapy: Secondary | ICD-10-CM | POA: Diagnosis not present

## 2023-11-15 LAB — URINALYSIS, ROUTINE W REFLEX MICROSCOPIC
Bacteria, UA: NONE SEEN
Bilirubin Urine: NEGATIVE
Glucose, UA: NEGATIVE mg/dL
Hgb urine dipstick: NEGATIVE
Ketones, ur: NEGATIVE mg/dL
Nitrite: NEGATIVE
Specific Gravity, Urine: 1.022 (ref 1.005–1.030)
pH: 6 (ref 5.0–8.0)

## 2023-11-15 LAB — CBC
HCT: 38.4 % (ref 36.0–46.0)
Hemoglobin: 13.1 g/dL (ref 12.0–15.0)
MCH: 33 pg (ref 26.0–34.0)
MCHC: 34.1 g/dL (ref 30.0–36.0)
MCV: 96.7 fL (ref 80.0–100.0)
Platelets: 281 10*3/uL (ref 150–400)
RBC: 3.97 MIL/uL (ref 3.87–5.11)
RDW: 12.7 % (ref 11.5–15.5)
WBC: 13.6 10*3/uL — ABNORMAL HIGH (ref 4.0–10.5)
nRBC: 0 % (ref 0.0–0.2)

## 2023-11-15 LAB — PREGNANCY, URINE: Preg Test, Ur: NEGATIVE

## 2023-11-15 LAB — COMPREHENSIVE METABOLIC PANEL
ALT: 8 U/L (ref 0–44)
AST: 9 U/L — ABNORMAL LOW (ref 15–41)
Albumin: 4.3 g/dL (ref 3.5–5.0)
Alkaline Phosphatase: 67 U/L (ref 38–126)
Anion gap: 10 (ref 5–15)
BUN: 15 mg/dL (ref 6–20)
CO2: 26 mmol/L (ref 22–32)
Calcium: 9.1 mg/dL (ref 8.9–10.3)
Chloride: 105 mmol/L (ref 98–111)
Creatinine, Ser: 0.77 mg/dL (ref 0.44–1.00)
GFR, Estimated: >60 mL/min (ref >60)
Glucose, Bld: 121 mg/dL — ABNORMAL HIGH (ref 70–99)
Potassium: 4.1 mmol/L (ref 3.5–5.1)
Sodium: 141 mmol/L (ref 135–145)
Total Bilirubin: 0.4 mg/dL (ref 0.0–1.2)
Total Protein: 6.2 g/dL — ABNORMAL LOW (ref 6.5–8.1)

## 2023-11-15 LAB — LIPASE, BLOOD: Lipase: 12 U/L (ref 11–51)

## 2023-11-15 MED ORDER — SODIUM CHLORIDE 0.9 % IV BOLUS
1000.0000 mL | Freq: Once | INTRAVENOUS | Status: AC
  Administered 2023-11-15: 1000 mL via INTRAVENOUS

## 2023-11-15 MED ORDER — ONDANSETRON HCL 4 MG/2ML IJ SOLN
4.0000 mg | Freq: Once | INTRAMUSCULAR | Status: AC
  Administered 2023-11-15: 4 mg via INTRAVENOUS
  Filled 2023-11-15: qty 2

## 2023-11-15 MED ORDER — IOHEXOL 300 MG/ML  SOLN
100.0000 mL | Freq: Once | INTRAMUSCULAR | Status: AC | PRN
  Administered 2023-11-15: 100 mL via INTRAVENOUS

## 2023-11-15 MED ORDER — MORPHINE SULFATE (PF) 4 MG/ML IV SOLN
4.0000 mg | Freq: Once | INTRAVENOUS | Status: AC
  Administered 2023-11-15: 4 mg via INTRAVENOUS
  Filled 2023-11-15: qty 1

## 2023-11-15 MED ORDER — HYDROMORPHONE HCL 1 MG/ML IJ SOLN
0.5000 mg | Freq: Once | INTRAMUSCULAR | Status: AC
  Administered 2023-11-15: 0.5 mg via INTRAVENOUS
  Filled 2023-11-15: qty 1

## 2023-11-15 NOTE — ED Provider Notes (Signed)
 [place] EMERGENCY DEPARTMENT AT [facility] Provider Note   CSN: [id number] Arrival date & time: 11/15/23  4098     History  Chief Complaint  Patient presents with   Abdominal Pain    [name] is a 48 y.o. female.  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  Symptoms began yesterday.  She states since the resection she has had intermittent pains, but typically go away on their own after a few hours.  Pain is typically not intense.  She states she woke up yesterday with significant pain to the right upper quadrant underneath the rib cage.  It is worse with deep inspiration and movement.  She cannot lay on her right side.  She reports some nausea but no vomiting.  No fevers or chills.  No constipation.  She has had some loose stools for the past 2 days.  No melena or hematochezia.  No vaginal complaints.  No urinary complaints.  Pain begins in the epigastric region and radiates to the right upper quadrant and the right flank.  Described as a sharp sensation.  No lower abdominal pain.   Abdominal Pain Associated symptoms: nausea        Home Medications Prior to Admission medications   Medication Sig Start Date End Date Taking? Authorizing Provider  Atogepant (QULIPTA) 60 MG TABS One po qd 08/08/23   Asa Lente, MD  buPROPion (WELLBUTRIN XL) 300 MG 24 hr tablet Take 1 tablet by mouth daily 05/11/21 06/16/22  Helane Rima, DO  Diroximel Fumarate (VUMERITY) 231 MG CPDR Take 432 mg by mouth 2 (two) times daily.    Sater, Pearletha Furl, MD  DULoxetine (CYMBALTA) 60 MG capsule Take 60 mg by mouth daily. 07/24/23   [provider]  esomeprazole (NEXIUM) 40 MG capsule Take 1 capsule (40 mg total) by mouth daily. 10/20/21 01/18/22  Helane Rima, DO  gabapentin (NEURONTIN) 600 MG tablet Take 600mg  in the morning, 600mg  in the  afternoon and 1200mg  at bedtime 08/08/23   Sater, Pearletha Furl, MD  ibuprofen (ADVIL) 200 MG tablet Take 200 mg by mouth every 6 (six) hours as needed. 3x daily    [provider]  lamoTRIgine (LAMICTAL) 25 MG tablet One po every day x 5 d, then one po bid x 5d, then one po qAM and two po qPM x 5 d, then Two po bid 11/08/23   Sater, Pearletha Furl, MD  meloxicam (MOBIC) 15 MG tablet Take 1 tablet by mouth daily. 09/30/21   Helane Rima, DO  ondansetron (ZOFRAN) 4 MG tablet Take 1 tablet (4 mg total) by mouth every 8 (eight) hours as needed for nausea or vomiting. 11/05/21   Helane Rima, DO  propranolol ER (INDERAL LA) 60 MG 24 hr capsule Take 1 capsule (60 mg total) by mouth daily. 09/01/23   Sater, Pearletha Furl, MD  scopolamine (TRANSDERM-SCOP) 1 MG/3DAYS Place 1 patch onto the skin every 3 days as directed. 12/07/21   Helane Rima, DO  spironolactone (ALDACTONE) 50 MG tablet Take 50 mg by mouth daily.    [provider]  tiZANidine (ZANAFLEX) 4 MG capsule Take 1 capsule (4 mg total) by mouth 3 (three) times daily. 08/08/23   Sater, Pearletha Furl, MD      Allergies    Codeine and Other    Review of Systems   Review of Systems  Gastrointestinal:  Positive for abdominal pain and nausea.  All other systems reviewed and are negative.   Physical Exam Updated Vital Signs BP 105/70   Pulse 70   Temp 98.4 F (36.9 C) (Oral)   Resp 18   Ht 5\' 4"  (1.626 m)   Wt 58.1 kg   SpO2 100%   BMI 21.97 kg/m  Physical Exam Vitals and nursing note reviewed.  Constitutional:      General: She is not in acute distress.    Appearance: Normal appearance. She is normal weight. She is not ill-appearing.  HENT:     Head: Normocephalic and atraumatic.  Cardiovascular:     Rate and Rhythm: Normal rate and regular rhythm.  Pulmonary:     Effort: Pulmonary effort is normal. No respiratory distress.     Breath sounds: No wheezing, rhonchi or rales.  Abdominal:     General: Abdomen is flat. A  surgical scar is present.     Tenderness: There is abdominal tenderness in the right upper quadrant and epigastric area. There is no right CVA tenderness, left CVA tenderness or guarding.  Musculoskeletal:        General: Normal range of motion.     Cervical back: Neck supple.  Skin:    General: Skin is warm and dry.  Neurological:     Mental Status: She is alert and oriented to person, place, and time.  Psychiatric:        Mood and Affect: Mood normal.        Behavior: Behavior normal.     ED Results / Procedures / Treatments   Labs (all labs ordered are listed, but only abnormal results are displayed) Labs Reviewed  COMPREHENSIVE METABOLIC PANEL - Abnormal; Notable for the following components:      Result Value   Glucose, Bld 121 (*)    Total Protein 6.2 (*)    AST 9 (*)    All other components within normal limits  CBC - Abnormal; Notable for the following components:   WBC 13.6 (*)    All other components within normal limits  URINALYSIS, ROUTINE W REFLEX MICROSCOPIC - Abnormal; Notable for the following components:   Protein, ur TRACE (*)    Leukocytes,Ua MODERATE (*)    All other components within normal limits  LIPASE, BLOOD  PREGNANCY, URINE    EKG None  Radiology CT ABDOMEN PELVIS W CONTRAST Result Date: 11/15/2023 CLINICAL DATA:  Right-sided abdominal and flank pain beginning yesterday. Previous hepatic tumor resection. * Tracking Code: BO * EXAM: CT ABDOMEN AND PELVIS WITH CONTRAST TECHNIQUE: Multidetector CT imaging of the abdomen and pelvis was performed using the standard protocol following bolus administration of intravenous contrast. RADIATION DOSE REDUCTION: This exam was performed according to the departmental dose-optimization program which includes automated exposure control, adjustment of the mA and/or kV according to patient size and/or use of iterative reconstruction technique. CONTRAST:  OMNIPAQUE IOHEXOL 300 MG/ML  SOLN COMPARISON:  04/18/2023  FINDINGS: Lower Chest: No acute findings. Hepatobiliary: Stable postop changes are seen seen from right hepatectomy. No hepatic masses are identified. Main and left portal veins remain patent. Prior cholecystectomy. No No evidence of biliary ductal dilatation. Pancreas:  No mass or inflammatory changes. Spleen: Within normal limits in size and appearance. Adrenals/Urinary Tract: No suspicious masses identified. No evidence of ureteral calculi or hydronephrosis. Unremarkable unopacified urinary bladder. Stomach/Bowel: No evidence of obstruction, inflammatory process or abnormal fluid collections. Vascular/Lymphatic: No pathologically enlarged lymph nodes. No acute vascular findings.  Reproductive: Uterus is unremarkable. Benign-appearing cyst in the left posterior adnexa measures 3.8 x 2.4 cm, and remains stable since previous study. No evidence of inflammatory process or free fluid. Other:  None. Musculoskeletal:  No suspicious bone lesions identified. IMPRESSION: No acute findings. Stable postop changes from right hepatectomy. No evidence of residual or recurrent hepatic mass. Stable 3.8 cm benign-appearing left adnexal cyst (No followup imaging is recommended). Electronically Signed   By: Danae Orleans M.D.   On: 11/15/2023 13:36    Procedures Procedures    Medications Ordered in ED Medications  sodium chloride 0.9 % bolus 1,000 mL (0 mLs Intravenous Stopped 11/15/23 1245)  morphine (PF) 4 MG/ML injection 4 mg (4 mg Intravenous Given 11/15/23 1124)  ondansetron (ZOFRAN) injection 4 mg (4 mg Intravenous Given 11/15/23 1126)  iohexol (OMNIPAQUE) 300 MG/ML solution 100 mL (100 mLs Intravenous Contrast Given 11/15/23 1146)  HYDROmorphone (DILAUDID) injection 0.5 mg (0.5 mg Intravenous Given 11/15/23 1306)    ED Course/ Medical Decision Making/ A&P                                 Medical Decision Making Amount and/or Complexity of Data Reviewed Labs: ordered. Radiology: ordered.  Risk Prescription drug  management.  This patient presents to the ED for concern of right upper quadrant abdominal pain, this involves an extensive number of treatment options, and is a complaint that carries with it a high risk of complications and morbidity.  The emergent DDX for RUQ pain includes but is not limited to Glabladder disease, PUD, Acute Hepatitis, Pancreatitis, pyelonephritis, Pneumonia, Lower lobe PE/Infarct, Kidney stone, GERD, retrocecal appendicitis, Fitz-Hugh-Curtis syndrome, AAA, MI, Zoster.   My initial workup includes labs, imaging, symptom control  Additional history obtained from: Nursing notes from this visit.  I ordered, reviewed and interpreted labs which include: CBC, CMP, lipase, urinalysis, urine pregnancy.  Leukocytosis of 13.6.  No anemia.  No electrolyte derangement or kidney dysfunction.  Normal LFTs.  Urine without evidence of infection.  Urine pregnancy negative.  I ordered imaging studies including CT abdomen pelvis I independently visualized and interpreted imaging which showed no acute findings I agree with the radiologist interpretation  48 year old female presenting to the ED for evaluation of right-sided abdominal pain.  Symptoms began yesterday.  States this happens occasionally ever since she had her liver mass removed.  Pain is worse than typical today.  There is quite a bit of pain on palpation of the right side of her abdomen and epigastric region.  Lab workup overall very reassuring.  CT abdomen pelvis without acute abnormalities.  Suspect postoperative pain/adhesions.  She was encouraged to follow-up with her primary care provider.  Lower suspicion for acute emergent intra-abdominal abnormalities.  She was given return precautions.  Stable at discharge.  At this time there does not appear to be any evidence of an acute emergency medical condition and the patient appears stable for discharge with appropriate outpatient follow up. Diagnosis was discussed with patient who  verbalizes understanding of care plan and is agreeable to discharge. I have discussed return precautions with patient who verbalizes understanding. Patient encouraged to follow-up with their PCP within 1 week. All questions answered.  Note: Portions of this report may have been transcribed using voice recognition software. Every effort was made to ensure accuracy; however, inadvertent computerized transcription errors may still be present.        Final Clinical Impression(s) / ED Diagnoses  Final diagnoses:  RUQ abdominal pain    Rx / DC Orders ED Discharge Orders     None         Michelle Piper, Cordelia Poche 11/15/23 1419    Margarita Grizzle, MD 11/18/23 [phone]

## 2023-11-15 NOTE — ED Triage Notes (Signed)
 C/o R sided abd/flank pain x yesterday. Had liver tumor and removed January 2024. Denies n/v/d.

## 2023-11-15 NOTE — Discharge Instructions (Addendum)
 You have been seen today for your complaint of right-sided abdominal pain. Your lab work was reassuring. Your imaging was reassuring. Your discharge medications include Alternate tylenol and ibuprofen for pain. You may alternate these every 4 hours. You may take up to 800 mg of ibuprofen at a time and up to 650 mg of tylenol. Follow up with: Your primary care provider Please seek immediate medical care if you develop any of the following symptoms: You cannot stop vomiting. Your pain is only in one part of the abdomen. Pain on the right side could be caused by appendicitis. You have bloody or black poop (stool), or poop that looks like tar. You have trouble breathing. You have chest pain. At this time there does not appear to be the presence of an emergent medical condition, however there is always the potential for conditions to change. Please read and follow the below instructions.  Do not take your medicine if  develop an itchy rash, swelling in your mouth or lips, or difficulty breathing; call 911 and seek immediate emergency medical attention if this occurs.  You may review your lab tests and imaging results in their entirety on your MyChart account.  Please discuss all results of fully with your primary care provider and other specialist at your follow-up visit.  Note: Portions of this text may have been transcribed using voice recognition software. Every effort was made to ensure accuracy; however, inadvertent computerized transcription errors may still be present.

## 2023-11-17 ENCOUNTER — Encounter: Payer: Self-pay | Admitting: Neurology

## 2023-11-18 ENCOUNTER — Encounter: Payer: Self-pay | Admitting: Physical Medicine and Rehabilitation

## 2023-11-18 ENCOUNTER — Other Ambulatory Visit: Payer: Self-pay | Admitting: Neurology

## 2023-11-18 MED ORDER — SULFAMETHOXAZOLE-TRIMETHOPRIM 800-160 MG PO TABS: 1.0000 | ORAL_TABLET | Freq: Two times a day (BID) | ORAL | 0 refills | Status: DC

## 2023-11-20 ENCOUNTER — Telehealth: Admitting: Physician Assistant

## 2023-11-20 DIAGNOSIS — R197 Diarrhea, unspecified: Secondary | ICD-10-CM

## 2023-11-20 NOTE — Progress Notes (Signed)
 E-Visit for Diarrhea  We are sorry that you are not feeling well.  Here is how we plan to help!  Based on what you have shared with me it looks like you have Acute Infectious Diarrhea.  Most cases of acute diarrhea are due to infections with virus and bacteria and are self-limited conditions lasting less than 14 days.  For your symptoms you may take Imodium 2 mg tablets that are over the counter at your local pharmacy. Take two tablet now and then one after each loose stool up to 6 a day.  Antibiotics are not needed for most people with diarrhea.  I recommend you continue with Zofran for nausea and Imodium for diarrhea.  I do not recommend starting antibiotic if you are not having urinary symptoms.  For further evaluation recommend in person visit with Urgent Care. Please make sure you are staying hydrating with things like gatorade or pedialyte.    HOME CARE We recommend changing your diet to help with your symptoms for the next few days. Drink plenty of fluids that contain water salt and sugar. Sports drinks such as Gatorade may help.  You may try broths, soups, bananas, applesauce, soft breads, mashed potatoes or crackers.  You are considered infectious for as long as the diarrhea continues. Hand washing or use of alcohol based hand sanitizers is recommend. It is best to stay out of work or school until your symptoms stop.   GET HELP RIGHT AWAY If you have dark yellow colored urine or do not pass urine frequently you should drink more fluids.   If your symptoms worsen  If you feel like you are going to pass out (faint) You have a new problem  MAKE SURE YOU  Understand these instructions. Will watch your condition. Will get help right away if you are not doing well or get worse.  Thank you for choosing an e-visit.  Your e-visit answers were reviewed by a board certified advanced clinical practitioner to complete your personal care plan. Depending upon the condition, your plan  could have included both over the counter or prescription medications.  Please review your pharmacy choice. Make sure the pharmacy is open so you can pick up prescription now. If there is a problem, you may contact your provider through [employer] and have the prescription routed to another pharmacy.  Your safety is important to [place]. If you have drug allergies check your prescription carefully.   For the next 24 hours you can use MyChart to ask questions about today's visit, request a non-urgent call back, or ask for a work or school excuse. You will get an email in the next two days asking about your experience. I hope that your e-visit has been valuable and will speed your recovery.   I have spent 5 minutes in review of e-visit questionnaire, review and updating patient chart, medical decision making and response to patient.   Tylene Fantasia Ward, PA-C

## 2023-12-02 ENCOUNTER — Emergency Department (HOSPITAL_COMMUNITY): Admission: EM | Admit: 2023-12-02 | Discharge: 2023-12-02 | Disposition: A | Discharge status: Home / Self Care

## 2023-12-02 ENCOUNTER — Other Ambulatory Visit: Payer: Self-pay

## 2023-12-02 ENCOUNTER — Emergency Department (HOSPITAL_COMMUNITY)

## 2023-12-02 DIAGNOSIS — S59911A Unspecified injury of right forearm, initial encounter: Secondary | ICD-10-CM | POA: Diagnosis present

## 2023-12-02 DIAGNOSIS — S51811A Laceration without foreign body of right forearm, initial encounter: Secondary | ICD-10-CM | POA: Diagnosis not present

## 2023-12-02 DIAGNOSIS — Z23 Encounter for immunization: Secondary | ICD-10-CM | POA: Diagnosis not present

## 2023-12-02 DIAGNOSIS — W540XXA Bitten by dog, initial encounter: Secondary | ICD-10-CM | POA: Diagnosis not present

## 2023-12-02 MED ORDER — AMOXICILLIN-POT CLAVULANATE 875-125 MG PO TABS: 1.0000 | ORAL_TABLET | Freq: Two times a day (BID) | ORAL | 0 refills | Status: DC

## 2023-12-02 MED ORDER — TETANUS-DIPHTH-ACELL PERTUSSIS 5-2.5-18.5 LF-MCG/0.5 IM SUSY
0.5000 mL | PREFILLED_SYRINGE | Freq: Once | INTRAMUSCULAR | Status: AC
  Administered 2023-12-02: 0.5 mL via INTRAMUSCULAR
  Filled 2023-12-02: qty 0.5

## 2023-12-02 MED ORDER — LIDOCAINE HCL (PF) 1 % IJ SOLN
20.0000 mL | Freq: Once | INTRAMUSCULAR | Status: AC
  Administered 2023-12-02: 20 mL
  Filled 2023-12-02: qty 20

## 2023-12-02 MED ORDER — BACITRACIN ZINC 500 UNIT/GM EX OINT: 1.0000 | TOPICAL_OINTMENT | Freq: Two times a day (BID) | CUTANEOUS | 0 refills | Status: DC

## 2023-12-02 MED ORDER — AMOXICILLIN-POT CLAVULANATE 875-125 MG PO TABS
1.0000 | ORAL_TABLET | Freq: Once | ORAL | Status: AC
  Administered 2023-12-02: 1 via ORAL
  Filled 2023-12-02: qty 1

## 2023-12-02 MED ORDER — HYDROCODONE-ACETAMINOPHEN 5-325 MG PO TABS: 1.0000 | ORAL_TABLET | ORAL | 0 refills | Status: DC | PRN

## 2023-12-02 MED ORDER — HYDROCODONE-ACETAMINOPHEN 5-325 MG PO TABS
1.0000 | ORAL_TABLET | Freq: Once | ORAL | Status: AC
  Administered 2023-12-02: 1 via ORAL
  Filled 2023-12-02: qty 1

## 2023-12-02 NOTE — ED Provider Notes (Signed)
 [place] EMERGENCY DEPARTMENT AT [facility] Provider Note   CSN: [id number] Arrival date & time: 12/02/23  1032     History  Chief Complaint  Patient presents with   Animal Bite   HPI [name] is a 48 y.o. female presenting for dog bite.  Occurred about an hour ago.  Patient was at home with the both of her dogs when they started to fight with each other.  She attempted to "break them up" when one of her dogs bit her on the right dorsal forearm.  Reports pain in that area with some bleeding that was controlled with direct pressure with gauze.  Denies any loss of sensation or range of motion in her arm or hand.  States vaccines of each dog are up-to-date for the most part but states that rabies vaccination may not be up-to-date.  She reports that they mostly stay inside but occasionally going walks and use the bathroom in the backyard.  States her last tetanus shot was 2 years ago.   Animal Bite      Home Medications Prior to Admission medications   Medication Sig Start Date End Date Taking? Authorizing Provider  amoxicillin-clavulanate (AUGMENTIN) 875-125 MG tablet Take 1 tablet by mouth every 12 (twelve) hours. 12/02/23  Yes Gareth Eagle, PA-C  bacitracin ointment Apply 1 Application topically 2 (two) times daily. 12/02/23  Yes Gareth Eagle, PA-C  Atogepant (QULIPTA) 60 MG TABS One po qd 08/08/23   Asa Lente, MD  buPROPion (WELLBUTRIN XL) 300 MG 24 hr tablet Take 1 tablet by mouth daily 05/11/21 06/16/22  Helane Rima, DO  Diroximel Fumarate (VUMERITY) 231 MG CPDR Take 432 mg by mouth 2 (two) times daily.    Sater, Pearletha Furl, MD  DULoxetine (CYMBALTA) 60 MG capsule Take 60 mg by mouth daily. 07/24/23   [provider]  esomeprazole (NEXIUM) 40 MG capsule Take 1 capsule (40 mg total) by mouth daily. 10/20/21 01/18/22  Helane Rima, DO  gabapentin (NEURONTIN) 600 MG tablet Take 600mg  in the morning, 600mg  in the afternoon and 1200mg  at bedtime  08/08/23   Sater, Pearletha Furl, MD  ibuprofen (ADVIL) 200 MG tablet Take 200 mg by mouth every 6 (six) hours as needed. 3x daily    [provider]  lamoTRIgine (LAMICTAL) 25 MG tablet One po every day x 5 d, then one po bid x 5d, then one po qAM and two po qPM x 5 d, then Two po bid 11/08/23   Sater, Pearletha Furl, MD  meloxicam (MOBIC) 15 MG tablet Take 1 tablet by mouth daily. 09/30/21   Helane Rima, DO  ondansetron (ZOFRAN) 4 MG tablet Take 1 tablet (4 mg total) by mouth every 8 (eight) hours as needed for nausea or vomiting. 11/05/21   Helane Rima, DO  propranolol ER (INDERAL LA) 60 MG 24 hr capsule Take 1 capsule (60 mg total) by mouth daily. 09/01/23   Sater, Pearletha Furl, MD  scopolamine (TRANSDERM-SCOP) 1 MG/3DAYS Place 1 patch onto the skin every 3 days as directed. 12/07/21   Helane Rima, DO  spironolactone (ALDACTONE) 50 MG tablet Take 50 mg by mouth daily.    [provider]  sulfamethoxazole-trimethoprim (BACTRIM DS) 800-160 MG tablet Take 1 tablet by mouth 2 (two) times daily. 11/18/23   Sater, Pearletha Furl, MD  tiZANidine (ZANAFLEX) 4 MG capsule Take 1 capsule (4 mg total) by mouth 3 (three) times daily. 08/08/23   Sater, Pearletha Furl, MD  Allergies    Codeine and Other    Review of Systems   See HPI  Physical Exam Updated Vital Signs BP 116/72 (BP Location: Left Arm)   Pulse 75   Temp 98.5 F (36.9 C) (Oral)   Resp 19   SpO2 100%  Physical Exam Constitutional:      Appearance: Normal appearance.  HENT:     Head: Normocephalic.     Nose: Nose normal.  Eyes:     Conjunctiva/sclera: Conjunctivae normal.  Pulmonary:     Effort: Pulmonary effort is normal.  Musculoskeletal:       Arms:     Comments: Strength is 5/5 in both hands.  Sensation intact distally to the upper extremities.  Cap refill is brisk distally to the laceration.  Neurological:     Mental Status: She is alert.  Psychiatric:        Mood and Affect: Mood normal.     ED Results /  Procedures / Treatments   Labs (all labs ordered are listed, but only abnormal results are displayed) Labs Reviewed - No data to display  EKG None  Radiology DG Forearm Right Result Date: 12/02/2023 CLINICAL DATA:  Dog bite. EXAM: RIGHT FOREARM - 2 VIEW COMPARISON:  None Available. FINDINGS: There is no evidence of fracture or other focal bone lesions. Soft tissue defect or laceration is noted posteriorly in distal right forearm. No radiopaque foreign body. IMPRESSION: No fracture or dislocation. Electronically Signed   By: Lupita Raider M.D.   On: 12/02/2023 11:27    Procedures .Laceration Repair  Date/Time: 12/02/2023 11:59 AM  Performed by: Gareth Eagle, PA-C Authorized by: Gareth Eagle, PA-C   Consent:    Consent obtained:  Verbal   Consent given by:  Patient   Risks, benefits, and alternatives were discussed: yes     Risks discussed:  Infection and need for additional repair Universal protocol:    Procedure explained and questions answered to patient or proxy's satisfaction: yes     Relevant documents present and verified: yes     Test results available: yes     Imaging studies available: yes     Patient identity confirmed:  Verbally with patient and arm band Anesthesia:    Anesthesia method:  Local infiltration   Local anesthetic:  Lidocaine 1% w/o epi Laceration details:    Location:  Shoulder/arm   Shoulder/arm location:  R lower arm   Length (cm):  3   Depth (mm):  6 Exploration:    Hemostasis achieved with:  Direct pressure   Imaging obtained: x-ray     Imaging outcome: foreign body not noted     Wound exploration: wound explored through full range of motion     Wound extent: areolar tissue violated     Contaminated: no   Treatment:    Area cleansed with:  Saline   Amount of cleaning:  Extensive   Irrigation solution:  Sterile water   Irrigation volume:  1000 ml   Irrigation method:  Pressure wash   Debridement:  None   Undermining:  None Skin  repair:    Repair method:  Sutures   Suture size:  3-0   Suture material:  Prolene   Suture technique:  Simple interrupted   Number of sutures:  3 Approximation:    Approximation:  Loose Repair type:    Repair type:  Intermediate Post-procedure details:    Dressing:  Non-adherent dressing   Procedure completion:  Tolerated well, no  immediate complications     Medications Ordered in ED Medications  lidocaine (PF) (XYLOCAINE) 1 % injection 20 mL (has no administration in time range)  amoxicillin-clavulanate (AUGMENTIN) 875-125 MG per tablet 1 tablet (has no administration in time range)  Tdap (BOOSTRIX) injection 0.5 mL (has no administration in time range)  HYDROcodone-acetaminophen (NORCO/VICODIN) 5-325 MG per tablet 1 tablet (1 tablet Oral Given 12/02/23 1057)    ED Course/ Medical Decision Making/ A&P Clinical Course as of 12/02/23 1206  Fri Dec 02, 2023  1136 DG Forearm Right [JR]    Clinical Course User Index [JR] Gareth Eagle, PA-C                                 Medical Decision Making Amount and/or Complexity of Data Reviewed Radiology: ordered. Decision-making details documented in ED Course.  Risk Prescription drug management.  48 year old well-appearing female presenting for dog bite.  Exam notable for open gaping laceration to the right forearm.  Laceration repair went well without complications.  Discussed appropriate wound care at home.  Started on Augmentin.  Advised to follow-up with her PCP in 6 to 8 days.  Discussed return precautions.  Considered rabies treatment but feel it is not indicated at this time given patient was bitten by her own dogs and in fact they are mostly up-to-date without any abnormal behavior reported regarding the dogs.  Advised her to reach out to animal control for further commendations.  Also advised f/u with PCP.  Discharge.         Final Clinical Impression(s) / ED Diagnoses Final diagnoses:  Dog bite, initial  encounter    Rx / DC Orders ED Discharge Orders          Ordered    amoxicillin-clavulanate (AUGMENTIN) 875-125 MG tablet  Every 12 hours        12/02/23 1202    bacitracin ointment  2 times daily        12/02/23 1202              Gareth Eagle, PA-C 12/02/23 1208    Coral Spikes, DO 12/02/23 1453

## 2023-12-02 NOTE — ED Triage Notes (Signed)
 PT BIB EMS for a dog bite to her right arm.  It was her dog, multiple puncture wounds. Bleeding controlled.    100 mcg Fentanyl 22 L hand 120/60

## 2023-12-02 NOTE — ED Notes (Signed)
 Patients bites cleaned and dressed.

## 2023-12-02 NOTE — Discharge Instructions (Addendum)
 Last patient repair went well today.  Please take the Augmentin to prevent infection.  Also please clean daily with soap and water and apply bacitracin 1-2 times a day.  Please follow-up with your PCP in 6 to 8 days for reevaluation and potential suture removal.  If there are any concern for infection like worsening swelling, pain, pustulant discharge, redness or it is hot to touch or any other concerning symptom please return emerged part further evaluation.  Sutured repair Keep the laceration site dry for the next 24 hours and leave the dressing in place. After 24 hours you may remove the dressing and gently clean the laceration site with antibacterial soap and warm water. Do not scrub the area. Do not soak the area and water for long periods of time. Don't use hydrogen peroxide, iodine-based solutions, or alcohol, which can slow healing, and will probably be painful! Apply topical bacitracin 1-2 times per day for the next 3-5 days. Return to the emergency department (or PCP) in 6 to 8 days for removal of the sutures.  You should return sooner for any signs of infection which would include increased redness around the wound, increased swelling, new drainage of yellow pus.

## 2023-12-14 ENCOUNTER — Encounter: Payer: Self-pay | Admitting: Neurology

## 2023-12-16 ENCOUNTER — Telehealth: Admitting: Physician Assistant

## 2023-12-16 DIAGNOSIS — R6889 Other general symptoms and signs: Secondary | ICD-10-CM | POA: Diagnosis not present

## 2023-12-16 MED ORDER — OSELTAMIVIR PHOSPHATE 75 MG PO CAPS: 75.0000 mg | ORAL_CAPSULE | Freq: Two times a day (BID) | ORAL | 0 refills | Status: DC

## 2023-12-16 MED ORDER — PSEUDOEPH-BROMPHEN-DM 30-2-10 MG/5ML PO SYRP: 5.0000 mL | ORAL_SOLUTION | Freq: Four times a day (QID) | ORAL | 0 refills | Status: DC | PRN

## 2023-12-16 NOTE — Progress Notes (Signed)
 E visit for Flu like symptoms   We are sorry that you are not feeling well.  Here is how we plan to help! Based on what you have shared with me it looks like you may have a respiratory virus that may be influenza.  Influenza or "the flu" is   an infection caused by a respiratory virus. The flu virus is highly contagious and persons who did not receive their yearly flu vaccination may "catch" the flu from close contact.  We have anti-viral medications to treat the viruses that cause this infection. They are not a "cure" and only shorten the course of the infection. These prescriptions are most effective when they are given within the first 2 days of "flu" symptoms. Antiviral medication are indicated if you have a high risk of complications from the flu. You should  also consider an antiviral medication if you are in close contact with someone who is at risk. These medications can help patients avoid complications from the flu  but have side effects that you should know. Possible side effects from Tamiflu or oseltamivir include nausea, vomiting, diarrhea, dizziness, headaches, eye redness, sleep problems or other respiratory symptoms. You should not take Tamiflu if you have an allergy to oseltamivir or any to the ingredients in Tamiflu.  Based upon your symptoms and potential risk factors I have prescribed Oseltamivir (Tamiflu).  It has been sent to your designated pharmacy.  You will take one 75 mg capsule orally twice a day for the next 5 days.  I have also prescribed Bromfed DM cough syrup Take 5mL every 6 hours as needed for cough.   ANYONE WHO HAS FLU SYMPTOMS SHOULD: Stay home. The flu is highly contagious and going out or to work exposes others! Be sure to drink plenty of fluids. Water is fine as well as fruit juices, sodas and electrolyte beverages. You may want to stay away from caffeine or alcohol. If you are nauseated, try taking small sips of liquids. How do you know if you are getting  enough fluid? Your urine should be a pale yellow or almost colorless. Get rest. Taking a steamy shower or using a humidifier may help nasal congestion and ease sore throat pain. Using a saline nasal spray works much the same way. Cough drops, hard candies and sore throat lozenges may ease your cough. Line up a caregiver. Have someone check on you regularly.   GET HELP RIGHT AWAY IF: You cannot keep down liquids or your medications. You become short of breath Your fell like you are going to pass out or loose consciousness. Your symptoms persist after you have completed your treatment plan MAKE SURE YOU  Understand these instructions. Will watch your condition. Will get help right away if you are not doing well or get worse.  Your e-visit answers were reviewed by a board certified advanced clinical practitioner to complete your personal care plan.  Depending on the condition, your plan could have included both over the counter or prescription medications.  If there is a problem please reply  once you have received a response from your provider.  Your safety is important to [place].  If you have drug allergies check your prescription carefully.    You can use MyChart to ask questions about today's visit, request a non-urgent call back, or ask for a work or school excuse for 24 hours related to this e-Visit. If it has been greater than 24 hours you will need to follow up with your  provider, or enter a new e-Visit to address those concerns.  You will get an e-mail in the next two days asking about your experience.  I hope that your e-visit has been valuable and will speed your recovery. Thank you for using e-visits.    I have spent 5 minutes in review of e-visit questionnaire, review and updating patient chart, medical decision making and response to patient.   Margaretann Loveless, PA-C

## 2023-12-27 ENCOUNTER — Encounter: Payer: Self-pay | Admitting: Neurology

## 2024-01-10 ENCOUNTER — Encounter: Attending: Physical Medicine and Rehabilitation | Admitting: Physical Medicine and Rehabilitation

## 2024-01-10 VITALS — BP 89/63 | HR 78 | Ht 64.0 in | Wt 134.0 lb

## 2024-01-10 DIAGNOSIS — Z5181 Encounter for therapeutic drug level monitoring: Secondary | ICD-10-CM | POA: Insufficient documentation

## 2024-01-10 DIAGNOSIS — M7502 Adhesive capsulitis of left shoulder: Secondary | ICD-10-CM | POA: Diagnosis not present

## 2024-01-10 DIAGNOSIS — G35 Multiple sclerosis: Secondary | ICD-10-CM | POA: Insufficient documentation

## 2024-01-10 DIAGNOSIS — Z79891 Long term (current) use of opiate analgesic: Secondary | ICD-10-CM | POA: Insufficient documentation

## 2024-01-10 DIAGNOSIS — M542 Cervicalgia: Secondary | ICD-10-CM | POA: Insufficient documentation

## 2024-01-10 DIAGNOSIS — G894 Chronic pain syndrome: Secondary | ICD-10-CM | POA: Diagnosis present

## 2024-01-10 MED ORDER — LIDOCAINE 5 % EX PTCH: 1.0000 | MEDICATED_PATCH | CUTANEOUS | 0 refills | Status: DC

## 2024-01-10 MED ORDER — JOURNAVX 50 MG PO TABS: 1.0000 | ORAL_TABLET | Freq: Two times a day (BID) | ORAL | 0 refills | Status: DC | PRN

## 2024-01-10 NOTE — Progress Notes (Signed)
 Subjective:    Patient ID: [name], female    DOB: Jan 31, 1976, 48 y.o.   MRN: [id number]  HPI [name] is a 48 year old woman who presents to establish care for left sided frozen shoulder  1) Frozen shoulder, left side -steroid injection helped -failed diclofenac , mobic , ibuprofen  -she has tried exercises at home. -hurts to lay on her shoulder to sleep at night  2) Cervical spine pain  3) Multiple sclerosis: -has aching pain in legs  Pain Inventory Average Pain 5 Pain Right Now 4 My pain is constant, sharp, dull, stabbing, and aching  In the last 24 hours, has pain interfered with the following? General activity 4 Relation with others 5 Enjoyment of life 10 What TIME of day is your pain at its worst? evening and night Sleep (in general) Poor  Pain is worse with: walking, bending, standing, and some activites Pain improves with: medication and injections Relief from Meds: 7  walk without assistance use a cane how many minutes can you walk? 10-15 ability to climb steps?  yes do you drive?  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  weakness numbness tremor tingling trouble walking spasms depression anxiety  New pt  New pt    Family History  Problem Relation Age of Onset   ADD / ADHD Mother    Colon polyps Mother    Colon polyps Father    Colon cancer Maternal Grandmother    Colon polyps Maternal Grandfather    Diabetes Maternal Grandfather    Colon polyps Paternal Grandmother    Colon polyps Paternal Grandfather    Social History   Socioeconomic History   Marital status: Divorced    Spouse name: Will   Number of children: 1   Years of education: Not on file   Highest education level: Not on file  Occupational History    Employer:    Occupation: CMA  Tobacco Use   Smoking status: Former    Current packs/day: 0.00    Average packs/day: 18.0 packs/day for 1 year (18.0 ttl pk-yrs)    Types: Cigarettes     Start date: 12/30/2011    Quit date: 12/29/2012    Years since quitting: 11.0   Smokeless tobacco: Never  Vaping Use   Vaping status: Never Used  Substance and Sexual Activity   Alcohol use: Yes    Alcohol/week: 1.0 - 2.0 standard drink of alcohol    Types: 1 - 2 Standard drinks or equivalent per week   Drug use: No   Sexual activity: Yes    Partners: Male    Birth control/protection: Pill  Other Topics Concern   Not on file  Social History Narrative   Not on file   Social Drivers of Health   Financial Resource Strain: Low Risk  (10/07/2022)   Received from [facility], [facility]   Overall Financial Resource Strain (CARDIA)    Difficulty of Paying Living Expenses: Not hard at all  Food Insecurity: No Food Insecurity (10/07/2022)   Received from [facility], [facility]   Hunger Vital Sign    Worried About Running Out of Food in the Last Year: Never true    Ran Out of Food in the Last Year: Never true  Transportation Needs: No Transportation Needs (10/07/2022)   Received from [facility], [facility]   [facility] - Transportation    In  the past 12 months, has lack of transportation kept you from medical appointments or from getting medications?: No    Lack of Transportation (Non-Medical): No  Physical Activity: Not on file  Stress: Not on file  Social Connections: Not on file   Past Surgical History:  Procedure Laterality Date   DE QUERVAIN'S RELEASE Bilateral    DILATATION & CURETTAGE/HYSTEROSCOPY WITH MYOSURE N/A 10/24/2017   Procedure: DILATATION & CURETTAGE/HYSTEROSCOPY WITH MYOSURE;  Surgeon: Wanita Gutta, MD;  Location: [facility];  Service: Gynecology;  Laterality: N/A;   IUD REMOVAL N/A 10/24/2017   Procedure: INTRAUTERINE DEVICE (IUD) REMOVAL;  Surgeon: Jertson, Jill Evelyn, MD;  Location: [facility];  Service: Gynecology;   Laterality: N/A;   TONSILLECTOMY     WISDOM TOOTH EXTRACTION     WRIST SURGERY Bilateral    2 ganglion cyst removed   Past Medical History:  Diagnosis Date   Anxiety    Carpal tunnel syndrome    Chronic lower back pain    Chronic tension headaches    Constipation    Dysmenorrhea    Endometrial polyp    Fatigue    Frozen shoulder    GERD (gastroesophageal reflux disease)    History of lumbar puncture 08/2016   HPV in female    Low serum vitamin D     Neck pain, chronic    Pelvic pain    Thyroid  nodule    Tinnitus, bilateral    TMJ (temporomandibular joint disorder)    White matter abnormality on MRI of brain    Ht 5\' 4"  (1.626 m)   Wt 134 lb (60.8 kg)   BMI 23.00 kg/m   Opioid Risk Score:   Fall Risk Score:  `1  Depression screen [facility] 2/9     01/10/2024   10:37 AM 11/07/2020   11:15 AM 11/09/2019    1:26 PM 05/16/2019    3:34 PM 06/07/2017    9:29 AM  Depression screen PHQ 2/9  Decreased Interest 2 1 1 2  0  Down, Depressed, Hopeless 1 0 0 2 0  PHQ - 2 Score 3 1 1 4  0  Altered sleeping 2 2 2 3    Tired, decreased energy 2 1 2 2    Change in appetite 0 0 0 0   Feeling bad or failure about yourself  0 0 0 0   Trouble concentrating 1 1 0 0   Moving slowly or fidgety/restless 0 0 0 0   Suicidal thoughts 0 0 0 0   PHQ-9 Score 8 5 5 9    Difficult doing work/chores Somewhat difficult Not difficult at all Somewhat difficult Somewhat difficult      Review of Systems  Musculoskeletal:  Positive for back pain.       Bilateral leg pain Left upper pain  All other systems reviewed and are negative.     Objective:   Physical Exam Gen: no distress, normal appearing HEENT: oral mucosa pink and moist, NCAT Cardio: Reg rate Chest: normal effort, normal rate of breathing Abd: soft, non-distended Ext: no edema Psych: pleasant, normal affect Skin: intact Neuro: Alert and oriented x3 Musculoskeletal: Left shoulder with limited range of motion      Assessment & Plan:   1)Chronic Pain Syndrome secondary to left sided frozen shoulder -discussed that she has had benefit from percocet in the past but can no longer get them from current prescribed -UDS and pain contract performed today, if with expected metabolites will prescribed Percocet 10mg  BID  prn  -Discussed Qutenza as an option for neuropathic pain control. Discussed that this is a capsaicin patch, stronger than capsaicin cream. Discussed that it is currently approved for diabetic peripheral neuropathy and post-herpetic neuralgia, but that it has also shown benefit in treating other forms of neuropathy. Provided patient with link to site to learn more about the patch: https://www.clark.biz/. Discussed that the patch would be placed in office and benefits usually last 3 months. Discussed that unintended exposure to capsaicin can cause severe irritation of eyes, mucous membranes, respiratory tract, and skin, but that Qutenza is a local treatment and does not have the systemic side effects of other nerve medications. Discussed that there may be pain, itching, erythema, and decreased sensory function associated with the application of Qutenza. Side effects usually subside within 1 week. A cold pack of analgesic medications can help with these side effects. Blood pressure can also be increased due to pain associated with administration of the patch.   -Discussed current symptoms of pain and history of pain.  -Discussed benefits of exercise in reducing pain. -Discussed following foods that may reduce pain: 1) Ginger (especially studied for arthritis)- reduce leukotriene production to decrease inflammation 2) Blueberries- high in phytonutrients that decrease inflammation 3) Salmon- marine omega-3s reduce joint swelling and pain 4) Pumpkin seeds- reduce inflammation 5) dark chocolate- reduces inflammation 6) turmeric- reduces inflammation 7) tart cherries - reduce pain and stiffness 8) extra virgin olive oil - its  compound olecanthal helps to block prostaglandins  9) chili peppers- can be eaten or applied topically via capsaicin 10) mint- helpful for headache, muscle aches, joint pain, and itching 11) garlic- reduces inflammation 12) Green tea- reduces inflammation and oxidative stress, helps with weight loss, may reduce the risk of cancer, recommend Double [employer] of Tea daily  Link to further information on diet for chronic pain: http://www.bray.com/   2) Cervical spine pain -lidocaine  patch 5% -discussed that Journavx is a highly selective inhibitor for Nav 1.8, which is specific for pain in the peripheral nervous system, discussed that lidocaine  in contrast affects all Nav receptors, discussed loading dose is 2 50mg  tablets and this can be taken 1 hour before food, discussed that then 50mg  can be taken with out without food q12H until pain is tolerable. Discussed that potential side effects are pruritus, muscle spasms, increase blood creatinine, rash. Discussed that we have samples available and we have copay cards available. Discussed that the 2 phase 3 clinicial trials sent to the FDA were for abdominoplasty and bunionectomy, discussed that it has been currently studied for 14 days, discussed that it can reduce efficacy of opioids and benzodiazepines, discussed that it has not be studied in severe hepatic impairment. Discussed that it is contraindicated with fluconazole. Discussed that it can interfere with hormonal contraception up to 28 days after use. Discussed that it can decrease fertility, it has not been studied in pregnancy, that it has been present in animal milk during lactation, not recommended for GFR <15, discussed that outpatient if the medication requires a prior auth the copay should be $30 for at least a 60 day supply. The medication may be more likely to be in stock in [facility] and [facility]. We do have samples  available   3) Multiple sclerosis  -recommended Wahls protocol

## 2024-01-10 NOTE — Patient Instructions (Signed)
 Foods that may reduce pain: 1) Ginger (especially studied for arthritis)- reduce leukotriene production to decrease inflammation 2) Blueberries- high in phytonutrients that decrease inflammation 3) Salmon- marine omega-3s reduce joint swelling and pain 4) Pumpkin seeds- reduce inflammation 5) dark chocolate- reduces inflammation 6) turmeric- reduces inflammation 7) tart cherries - reduce pain and stiffness 8) extra virgin olive oil - its compound olecanthal helps to block prostaglandins  9) chili peppers- can be eaten or applied topically via capsaicin 10) mint- helpful for headache, muscle aches, joint pain, and itching 11) garlic- reduces inflammation 12) Green tea- reduces inflammation and oxidative stress, helps with weight loss, may reduce the risk of cancer, recommend Double Green [place] of Tea daily  Link to further information on diet for chronic pain: http://www.bray.com/   [employer] Protocol

## 2024-01-10 NOTE — Addendum Note (Signed)
 Addended by: Avelina Bode T on: 01/10/2024 11:35 AM   Modules accepted: Orders

## 2024-01-15 LAB — DRUG TOX ALC METAB W/CON, ORAL FLD: Alcohol Metabolite: NEGATIVE ng/mL (ref <25)

## 2024-01-15 LAB — DRUG TOX MONITOR 1 W/CONF, ORAL FLD
Amphetamines: NEGATIVE ng/mL (ref <10)
Barbiturates: NEGATIVE ng/mL (ref <10)
Benzodiazepines: NEGATIVE ng/mL (ref <0.50)
Buprenorphine: NEGATIVE ng/mL (ref <0.10)
Cocaine: NEGATIVE ng/mL (ref <5.0)
Cotinine: 121.7 ng/mL — ABNORMAL HIGH (ref <5.0)
Fentanyl: NEGATIVE ng/mL (ref <0.10)
Heroin Metabolite: NEGATIVE ng/mL (ref <1.0)
MARIJUANA: NEGATIVE ng/mL (ref <2.5)
MDMA: NEGATIVE ng/mL (ref <10)
Meprobamate: NEGATIVE ng/mL (ref <2.5)
Methadone: NEGATIVE ng/mL (ref <5.0)
Nicotine Metabolite: POSITIVE ng/mL — AB (ref <5.0)
Opiates: NEGATIVE ng/mL (ref <2.5)
Phencyclidine: NEGATIVE ng/mL (ref <10)
Tapentadol: NEGATIVE ng/mL (ref <5.0)
Tramadol: NEGATIVE ng/mL (ref <5.0)
Zolpidem: NEGATIVE ng/mL (ref <5.0)

## 2024-01-16 ENCOUNTER — Encounter: Payer: Self-pay | Admitting: Physical Medicine and Rehabilitation

## 2024-01-16 ENCOUNTER — Telehealth: Payer: Self-pay | Admitting: *Deleted

## 2024-01-16 ENCOUNTER — Other Ambulatory Visit: Payer: Self-pay | Admitting: Physical Medicine and Rehabilitation

## 2024-01-16 MED ORDER — OXYCODONE-ACETAMINOPHEN 10-325 MG PO TABS: 1.0000 | ORAL_TABLET | Freq: Two times a day (BID) | ORAL | 0 refills | Status: DC | PRN

## 2024-01-16 NOTE — Telephone Encounter (Signed)
 Rx canceled  for oxycodone  acetaminophen  10-325 at [facility] pharmacy.

## 2024-01-17 ENCOUNTER — Other Ambulatory Visit: Payer: Self-pay | Admitting: Physical Medicine and Rehabilitation

## 2024-01-17 MED ORDER — OXYCODONE-ACETAMINOPHEN 10-325 MG PO TABS
1.0000 | ORAL_TABLET | Freq: Two times a day (BID) | ORAL | 0 refills | Status: DC | PRN
Start: 2024-01-17 — End: 2024-02-09

## 2024-01-17 NOTE — Telephone Encounter (Signed)
 [name] called again about her oxycodone  10/325. I cancelled the rx at [facility] and she needs [facility] to [facility] [address].

## 2024-01-18 ENCOUNTER — Telehealth: Payer: Self-pay | Admitting: *Deleted

## 2024-01-18 NOTE — Telephone Encounter (Signed)
 [name] called back because her medication was sent to  mail order pharmacy and needed to be to the retail pharmacy. Notified that medication has been sent to her correct pharmacy.

## 2024-01-19 ENCOUNTER — Encounter: Payer: Self-pay | Admitting: Neurology

## 2024-01-20 ENCOUNTER — Other Ambulatory Visit: Payer: Self-pay | Admitting: Neurology

## 2024-01-20 ENCOUNTER — Telehealth: Admitting: Physician Assistant

## 2024-01-20 DIAGNOSIS — H109 Unspecified conjunctivitis: Secondary | ICD-10-CM | POA: Diagnosis not present

## 2024-01-20 MED ORDER — LAMOTRIGINE 100 MG PO TABS: ORAL_TABLET | ORAL | 3 refills | Status: DC

## 2024-01-20 MED ORDER — OFLOXACIN 0.3 % OP SOLN: 1.0000 [drp] | Freq: Four times a day (QID) | OPHTHALMIC | 0 refills | Status: AC

## 2024-01-20 NOTE — Progress Notes (Signed)

## 2024-01-21 ENCOUNTER — Telehealth: Admitting: Emergency Medicine

## 2024-01-21 DIAGNOSIS — R34 Anuria and oliguria: Secondary | ICD-10-CM

## 2024-01-21 NOTE — Progress Notes (Signed)
  Because of the chronic and urgent manner of your condition, I feel your condition warrants further evaluation and I recommend that you be seen in a in person face-to-face visit.   NOTE: There will be NO CHARGE for this E-Visit   If you are having a true medical emergency, please call 911.     For an urgent face to face visit, [place] has multiple urgent care centers for your convenience.  Click the link below for the full list of locations and hours, walk-in wait times, appointment scheduling options and driving directions:  Urgent Care - [place], [place], [place], [place], [place], [place]  [place]     Your MyChart E-visit questionnaire answers were reviewed by a board certified advanced clinical practitioner to complete your personal care plan based on your specific symptoms.    Thank you for using e-Visits.  I have spent 5 minutes in review of e-visit questionnaire, review and updating patient chart, medical decision making and response to patient.   Louvenia Roys, PA-C

## 2024-02-09 ENCOUNTER — Encounter: Payer: Self-pay | Admitting: Registered Nurse

## 2024-02-09 ENCOUNTER — Encounter: Attending: Physical Medicine and Rehabilitation | Admitting: Registered Nurse

## 2024-02-09 VITALS — BP 107/72 | HR 66 | Ht 64.0 in | Wt 134.8 lb

## 2024-02-09 DIAGNOSIS — G8918 Other acute postprocedural pain: Secondary | ICD-10-CM

## 2024-02-09 DIAGNOSIS — M7502 Adhesive capsulitis of left shoulder: Secondary | ICD-10-CM | POA: Diagnosis present

## 2024-02-09 DIAGNOSIS — G35 Multiple sclerosis: Secondary | ICD-10-CM | POA: Diagnosis present

## 2024-02-09 DIAGNOSIS — Z79891 Long term (current) use of opiate analgesic: Secondary | ICD-10-CM | POA: Diagnosis present

## 2024-02-09 DIAGNOSIS — Z5181 Encounter for therapeutic drug level monitoring: Secondary | ICD-10-CM

## 2024-02-09 DIAGNOSIS — M792 Neuralgia and neuritis, unspecified: Secondary | ICD-10-CM

## 2024-02-09 DIAGNOSIS — G894 Chronic pain syndrome: Secondary | ICD-10-CM | POA: Diagnosis present

## 2024-02-09 MED ORDER — OXYCODONE-ACETAMINOPHEN 10-325 MG PO TABS: 1.0000 | ORAL_TABLET | Freq: Two times a day (BID) | ORAL | 0 refills | Status: DC | PRN

## 2024-02-09 NOTE — Progress Notes (Signed)
 Subjective:    Patient ID: [name], female    DOB: [birth year], 48 y.o.   MRN: [id number]  HPI: [name] is a 48 y.o. female who returns for follow up appointment for chronic pain and medication refill. She states her pain is located in her left shoulder and right abdominal pain she reports surgical pain. Also reports bilateral feet with tingling burning. She rates her pain 3. Her current exercise regime is walking and performing stretching exercises.  [name]  Morphine  equivalent is 30.00 MME.  Last Oral Swab was Performed on 01/10/2024, it was consistent.      Pain Inventory Average Pain 6 Pain Right Now 3 My pain is constant, sharp, burning, stabbing, and aching  In the last 24 hours, has pain interfered with the following? General activity 4 Relation with others 0 Enjoyment of life 8 What TIME of day is your pain at its worst? evening, night, and varies Sleep (in general) Poor  Pain is worse with: walking, bending, standing, and some activites Pain improves with: rest, heat/ice, and medication Relief from Meds: 7  Family History  Problem Relation Age of Onset   ADD / ADHD Mother    Colon polyps Mother    Colon polyps Father    Colon cancer Maternal Grandmother    Colon polyps Maternal Grandfather    Diabetes Maternal Grandfather    Colon polyps Paternal Grandmother    Colon polyps Paternal Grandfather    Social History   Socioeconomic History   Marital status: Divorced    Spouse name: Will   Number of children: 1   Years of education: Not on file   Highest education level: Not on file  Occupational History    Employer: [employer]   Occupation: CMA  Tobacco Use   Smoking status: Former    Current packs/day: 0.00    Average packs/day: 18.0 packs/day for 1 year (18.0 ttl pk-yrs)    Types: Cigarettes    Start date: 12/30/2011    Quit date: 12/29/2012    Years since quitting: 11.1   Smokeless tobacco: Never  Vaping Use   Vaping status: Never Used   Substance and Sexual Activity   Alcohol use: Yes    Alcohol/week: 1.0 - 2.0 standard drink of alcohol    Types: 1 - 2 Standard drinks or equivalent per week   Drug use: No   Sexual activity: Yes    Partners: Male    Birth control/protection: Pill  Other Topics Concern   Not on file  Social History Narrative   Not on file   Social Drivers of Health   Financial Resource Strain: Low Risk  (10/07/2022)   Received from [facility], [facility]   Overall Financial Resource Strain (CARDIA)    Difficulty of Paying Living Expenses: Not hard at all  Food Insecurity: No Food Insecurity (10/07/2022)   Received from [facility], [facility]   Hunger Vital Sign    Worried About Running Out of Food in the Last Year: Never true    Ran Out of Food in the Last Year: Never true  Transportation Needs: No Transportation Needs (10/07/2022)   Received from [facility], [facility]   [facility] - Transportation    In the past 12 months, has lack of transportation kept you from medical appointments or from getting medications?: No    Lack of Transportation (Non-Medical): No  Physical Activity:  Not on file  Stress: Not on file  Social Connections: Not on file   Past Surgical History:  Procedure Laterality Date   DE QUERVAIN'S RELEASE Bilateral    DILATATION & CURETTAGE/HYSTEROSCOPY WITH MYOSURE N/A 10/24/2017   Procedure: DILATATION & CURETTAGE/HYSTEROSCOPY WITH MYOSURE;  Surgeon: Wanita Gutta, MD;  Location: [facility];  Service: Gynecology;  Laterality: N/A;   IUD REMOVAL N/A 10/24/2017   Procedure: INTRAUTERINE DEVICE (IUD) REMOVAL;  Surgeon: Jertson, Jill Evelyn, MD;  Location: [facility];  Service: Gynecology;  Laterality: N/A;   TONSILLECTOMY     WISDOM TOOTH EXTRACTION     WRIST SURGERY Bilateral    2 ganglion cyst removed   Past Surgical History:   Procedure Laterality Date   DE QUERVAIN'S RELEASE Bilateral    DILATATION & CURETTAGE/HYSTEROSCOPY WITH MYOSURE N/A 10/24/2017   Procedure: DILATATION & CURETTAGE/HYSTEROSCOPY WITH MYOSURE;  Surgeon: Wanita Gutta, MD;  Location: [facility];  Service: Gynecology;  Laterality: N/A;   IUD REMOVAL N/A 10/24/2017   Procedure: INTRAUTERINE DEVICE (IUD) REMOVAL;  Surgeon: Jertson, Jill Evelyn, MD;  Location: [facility];  Service: Gynecology;  Laterality: N/A;   TONSILLECTOMY     WISDOM TOOTH EXTRACTION     WRIST SURGERY Bilateral    2 ganglion cyst removed   Past Medical History:  Diagnosis Date   Anxiety    Carpal tunnel syndrome    Chronic lower back pain    Chronic tension headaches    Constipation    Dysmenorrhea    Endometrial polyp    Fatigue    Frozen shoulder    GERD (gastroesophageal reflux disease)    History of lumbar puncture 08/2016   HPV in female    Low serum vitamin D     Neck pain, chronic    Pelvic pain    Thyroid  nodule    Tinnitus, bilateral    TMJ (temporomandibular joint disorder)    White matter abnormality on MRI of brain    BP 107/72 (BP Location: Left Arm, Patient Position: Sitting)   Pulse 66   Ht 5\' 4"  (1.626 m)   Wt 134 lb 12.8 oz (61.1 kg)   SpO2 99%   BMI 23.14 kg/m   Opioid Risk Score:   Fall Risk Score:  `1  Depression screen [facility] 2/9     02/09/2024    9:50 AM 01/10/2024   10:37 AM 11/07/2020   11:15 AM 11/09/2019    1:26 PM 05/16/2019    3:34 PM 06/07/2017    9:29 AM  Depression screen PHQ 2/9  Decreased Interest 0 2 1 1 2  0  Down, Depressed, Hopeless 0 1 0 0 2 0  PHQ - 2 Score 0 3 1 1 4  0  Altered sleeping  2 2 2 3    Tired, decreased energy  2 1 2 2    Change in appetite  0 0 0 0   Feeling bad or failure about yourself   0 0 0 0   Trouble concentrating  1 1 0 0   Moving slowly or fidgety/restless  0 0 0 0   Suicidal thoughts  0 0 0 0   PHQ-9 Score  8 5 5 9    Difficult doing work/chores   Somewhat difficult Not difficult at all Somewhat difficult Somewhat difficult      Review of Systems  All other systems reviewed and are negative.      Objective:   Physical Exam Vitals and  nursing note reviewed.  Constitutional:      Appearance: Normal appearance.  Cardiovascular:     Rate and Rhythm: Normal rate and regular rhythm.     Pulses: Normal pulses.     Heart sounds: Normal heart sounds.  Pulmonary:     Effort: Pulmonary effort is normal.     Breath sounds: Normal breath sounds.  Musculoskeletal:     Comments: Normal Muscle Bulk and Muscle Testing Reveals:  Upper Extremities: Right: Full ROM and Muscle Strength 5/5 Left Upper Extremity: Decreased ROM 90 Degrees and Muscle Strength 5/5 Left AC Joint Tenderness Lumbar Paraspinal Tenderness: L-4-L-5 Lower Extremities: Full ROM and Muscle Strength 5/5 Arises from Table with ease Narrow Based  Gait     Skin:    General: Skin is warm and dry.  Neurological:     Mental Status: She is alert and oriented to person, place, and time.  Psychiatric:        Mood and Affect: Mood normal.        Behavior: Behavior normal.         Assessment & Plan:  Adhesive Capsulitis of left shoulder: Continue HEP a Tolerated. Continue to Monitor.  Pain at surgical site: Continue current medication regimen. Continue to Monitor.  Neuropathic Pain: She is scheduled for [place] with Dr Sylvie Every. Continue to Monitor.  Multiple Sclerosis : Neurology Following. Continue to Monitor  Chronic Pain Syndrome: Refilled: Oxycodone  10/325 mg one tablet twice a day as needed for pain #60. Second script sent for the following month. We will continue the opioid monitoring program, this consists of regular clinic visits, examinations, urine drug screen, pill counts as well as use of [place]  Controlled Substance Reporting system. A 12 month History has been reviewed on the [place]  Controlled Substance Reporting System on 02/09/2024    F/U  with Dr Alessandra Ancona

## 2024-02-19 ENCOUNTER — Other Ambulatory Visit: Payer: Self-pay | Admitting: Neurology

## 2024-02-21 ENCOUNTER — Encounter: Payer: Self-pay | Admitting: Neurology

## 2024-02-21 NOTE — Telephone Encounter (Signed)
 Last seen on 10/05/23 Follow up scheduled on 02/22/24

## 2024-02-22 ENCOUNTER — Ambulatory Visit: Payer: No Typology Code available for payment source | Admitting: Neurology

## 2024-03-13 ENCOUNTER — Ambulatory Visit (INDEPENDENT_AMBULATORY_CARE_PROVIDER_SITE_OTHER): Admitting: Neurology

## 2024-03-13 ENCOUNTER — Telehealth: Payer: Self-pay | Admitting: *Deleted

## 2024-03-13 ENCOUNTER — Encounter: Payer: Self-pay | Admitting: Neurology

## 2024-03-13 VITALS — BP 93/67 | HR 67 | Ht 64.0 in | Wt 130.5 lb

## 2024-03-13 DIAGNOSIS — R269 Unspecified abnormalities of gait and mobility: Secondary | ICD-10-CM | POA: Diagnosis not present

## 2024-03-13 DIAGNOSIS — Z79899 Other long term (current) drug therapy: Secondary | ICD-10-CM

## 2024-03-13 DIAGNOSIS — G250 Essential tremor: Secondary | ICD-10-CM

## 2024-03-13 DIAGNOSIS — G43109 Migraine with aura, not intractable, without status migrainosus: Secondary | ICD-10-CM

## 2024-03-13 DIAGNOSIS — G35 Multiple sclerosis: Secondary | ICD-10-CM | POA: Diagnosis not present

## 2024-03-13 DIAGNOSIS — D134 Benign neoplasm of liver: Secondary | ICD-10-CM

## 2024-03-13 MED ORDER — LAMOTRIGINE 200 MG PO TABS: ORAL_TABLET | ORAL | 3 refills | Status: AC

## 2024-03-13 NOTE — Telephone Encounter (Signed)
  Faxed to [phone] fax confirmation received.

## 2024-03-13 NOTE — Progress Notes (Signed)
 [facility]  PATIENT: [name] DOB: 03-07-1976  REFERRING DOCTOR OR PCP: Geni Shutter, DO SOURCE: Patient, notes from [facility] neurology, imaging and lab reports, several MRIs of the brain personally reviewed  _________________________________   HISTORICAL  CHIEF COMPLAINT:  Chief Complaint  Patient presents with   Follow-up    Pt in room 11. Alone Here for MS follow up. On Vumerity. Pt reports doing okay, noticed some balance concerns, bilateral achy leg pain, comes and goes. Increased lower back pain,. No falls, last eye exam was last year.     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  UPDATE 03/13/2024 She started glatiramer  40 mg three times per week and tolerated the injections well.  However, she had a high copay so we started Vumerity.   She would prefer to go back on glatiramer .     She has no new exacerbation but has more sensory symptoms.    Gait is doing well.   She uses bannister on stairs.  Balance is mildly off.   Strength and sensation are fine.   Bladder function shows some hesitancy.   Vision is fine.     She has some fatigue daily.    This was worse in heat.    She has noted cognitive issues including reduced focus at work and word finding - started in 2023.   She has some depression and takes Wellbutrin .  She has some anxiety.     Both legs ache at times.   Nothing makes it better or worse.   She is on gabapentin  [phone] mg with some benefit,   She has neck pain,  LBP and mid back - worse with standing.   She notes a benefit from gabapentin    She had MBB/RFA in her neck with benefit  Migraines are done well on Qulipta  and she tolerates it well.   Only once in last 3 months.   She had milder benefit from topiramate .    [name] helps when one occurs  She notes a tremor in both hands.   It worsens with intention.  No known FH but she does not know dad's FH well.    Propranolol  has  helped aand it has lowered the amplitude.   She notes some twitching at times  She had a liver tumor and had a lobectomy.  She has had abdominal pain since and will be having    The mass was found on T-spine MRI in 2023.   She sees pain management ad is on oxycodone .   Lamotrigine  had helped some.  Journavx  had not helped   She has insomnia and is on Dayvigo   No Vascular risks:   No tobacco or ETOH.  No HTN, DM, hyperlipidemia.    No cardiac issues.      MS History: In 2005, she had ataxia veering to the right when she was walking.   She was not dizzy but could not walk straight.   A brain MRI in showed one subtle right periventricular focus.   She received 3 days of IV steroid.   She got completely back to baseline by discharge.   She has had some sensoy fluctuation in her hands with numbness lasting minutes but not days.    She also has had soe fluctuations in her vision.   The spinal cord showed no lesions.  She has scattered brain lesions and there was one additional one in  2024 compared to previous MRI.  I had spoken with her - between her clinical history, CSF showing borderline number of OCB (3), MRI showing periventricular and juxtacortical lesions, new MRI with additional lesion that I believe she likely has MS.      She has been on glatiramer  09/2022.   Imaging: MRI of the brain 08/29/2004 shows a subtle right periventricular focus.  MRI of the brain 08/13/2016 shows a T2/flair hyperintense focus in the juxtacortical white matter of the posterior left frontal lobe and 2 small periventricular foci and 2 or 3 punctate foci in the deep white matter.  MRI of the brain 05/05/2017 showed no new lesions.  CSF 04/19/2017 showed 3 oligoclonal bands seen in the CSF not detected in the cervical (criteria was 4)  MRI cervical and thoracic spine 06/19/2022 showed a normal spinal cord, mild cervical DJD, thyroid  nodule and 12 cm liver mass  MRI brain 06/19/2022 showed 1 new lesion compared to  2018  REVIEW OF SYSTEMS: Constitutional: No fevers, chills, sweats, or change in appetite Eyes: No visual changes, double vision, eye pain Ear, nose and throat: No hearing loss, ear pain, nasal congestion, sore throat Cardiovascular: No chest pain, palpitations Respiratory:  No shortness of breath at rest or with exertion.   No wheezes GastrointestinaI: No nausea, vomiting, diarrhea, abdominal pain, fecal incontinence Genitourinary:  No dysuria, urinary retention or frequency.  No nocturia. Musculoskeletal: She has some neck and back pain.  She had a right frozen shoulder and needed surgery in 12/2019.  She did Rehab.   She now has a left frozen that started in December 2022.  She is getting cortisone shots.    Integumentary: No rash, pruritus, skin lesions Neurological: as above Psychiatric: No depression at this time.  No anxiety Endocrine: No palpitations, diaphoresis, change in appetite, change in weigh or increased thirst Hematologic/Lymphatic:  No anemia, purpura, petechiae. Allergic/Immunologic: No itchy/runny eyes, nasal congestion, recent allergic reactions, rashes  ALLERGIES: Allergies  Allergen Reactions   Dexlansoprazole  Diarrhea    Constant diarrhea   Pseudoeph-Bromphen-Dm Other (See Comments)    Anxious - couldn't sit still   Codeine Nausea Only   Other Other (See Comments)    Anxious - couldn't sit still (brompheniramine DM)    HOME MEDICATIONS:  Current Outpatient Medications:    Diroximel Fumarate (VUMERITY) 231 MG CPDR, Take 432 mg by mouth 2 (two) times daily., Disp: , Rfl:    DULoxetine (CYMBALTA) 60 MG capsule, Take 60 mg by mouth daily., Disp: , Rfl:    esomeprazole  (NEXIUM ) 40 MG capsule, Take 1 capsule (40 mg total) by mouth daily., Disp: 90 capsule, Rfl: 0   gabapentin  (NEURONTIN ) 600 MG tablet, Take 600mg  in the morning, 600mg  in the afternoon and 1200mg  at bedtime, Disp: 360 tablet, Rfl: 3   ibuprofen  (ADVIL ) 200 MG tablet, Take 200 mg by mouth every 6  (six) hours as needed. 3x daily, Disp: , Rfl:    lidocaine  (LIDODERM ) 5 %, Place 1 patch onto the skin daily. Remove & Discard patch within 12 hours or as directed by MD, Disp: 30 patch, Rfl: 0   oxyCODONE -acetaminophen  (PERCOCET) 10-325 MG tablet, Take 1 tablet by mouth 2 (two) times daily as needed for pain., Disp: 60 tablet, Rfl: 0   propranolol  ER (INDERAL  LA) 60 MG 24 hr capsule, Take 1 capsule (60 mg total) by mouth daily., Disp: 30 capsule, Rfl: 5   scopolamine  (TRANSDERM-SCOP) 1 MG/3DAYS, Place 1 patch onto the skin every 3 days as directed.,  Disp: 10 patch, Rfl: 12   spironolactone (ALDACTONE) 50 MG tablet, Take 50 mg by mouth daily., Disp: , Rfl:    tiZANidine  (ZANAFLEX ) 4 MG tablet, TAKE 1 TABLET BY MOUTH IN MORNING,1 TAB AT NOON,1 TAB IN EVENING,AND 1TAB AT BEDTIME, Disp: 120 tablet, Rfl: 2   lamoTRIgine  (LAMICTAL ) 200 MG tablet, One po  bid, Disp: 180 tablet, Rfl: 3   Suzetrigine  (JOURNAVX ) 50 MG TABS, Take 1 tablet by mouth 2 (two) times daily as needed., Disp: 60 tablet, Rfl: 0  PAST MEDICAL HISTORY: Past Medical History:  Diagnosis Date   Anxiety    Carpal tunnel syndrome    Chronic lower back pain    Chronic tension headaches    Constipation    Dysmenorrhea    Endometrial polyp    Fatigue    Frozen shoulder    GERD (gastroesophageal reflux disease)    History of lumbar puncture 08/2016   HPV in female    Low serum vitamin D     Neck pain, chronic    Pelvic pain    Thyroid  nodule    Tinnitus, bilateral    TMJ (temporomandibular joint disorder)    White matter abnormality on MRI of brain     PAST SURGICAL HISTORY: Past Surgical History:  Procedure Laterality Date   DE QUERVAIN'S RELEASE Bilateral    DILATATION & CURETTAGE/HYSTEROSCOPY WITH MYOSURE N/A 10/24/2017   Procedure: DILATATION & CURETTAGE/HYSTEROSCOPY WITH MYOSURE;  Surgeon: Jannis Kate Norris, MD;  Location: [facility];  Service: Gynecology;  Laterality: N/A;   IUD REMOVAL N/A 10/24/2017    Procedure: INTRAUTERINE DEVICE (IUD) REMOVAL;  Surgeon: Jertson, Jill Evelyn, MD;  Location[facility];  Service: Gynecology;  Laterality: N/A;   TONSILLECTOMY     WISDOM TOOTH EXTRACTION     WRIST SURGERY Bilateral    2 ganglion cyst removed    FAMILY HISTORY: Family History  Problem Relation Age of Onset   ADD / ADHD Mother    Colon polyps Mother    Colon polyps Father    Colon cancer Maternal Grandmother    Colon polyps Maternal Grandfather    Diabetes Maternal Grandfather    Colon polyps Paternal Grandmother    Colon polyps Paternal Grandfather     SOCIAL HISTORY: Social History   Socioeconomic History   Marital status: Divorced    Spouse name: Will   Number of children: 1   Years of education: Not on file   Highest education level: Not on file  Occupational History    Employer: [employer]   Occupation: CMA  Tobacco Use   Smoking status: Former    Current packs/day: 0.00    Average packs/day: 18.0 packs/day for 1 year (18.0 ttl pk-yrs)    Types: Cigarettes    Start date: 12/30/2011    Quit date: 12/29/2012    Years since quitting: 11.2   Smokeless tobacco: Never  Vaping Use   Vaping status: Never Used  Substance and Sexual Activity   Alcohol use: Yes    Alcohol/week: 1.0 - 2.0 standard drink of alcohol    Types: 1 - 2 Standard drinks or equivalent per week   Drug use: No   Sexual activity: Yes    Partners: Male    Birth control/protection: Pill  Other Topics Concern   Not on file  Social History Narrative   Not on file   Social Drivers of Health   Financial Resource Strain: Low Risk  (10/07/2022)   Received from  [facility]   Overall Financial Resource Strain (CARDIA)    Difficulty of Paying Living Expenses: Not hard at all  Food Insecurity: No Food Insecurity (10/07/2022)   Received from [facility]   Hunger Vital Sign    Within the past 12 months, you worried that your food would run out before  you got the money to buy more.: Never true    Within the past 12 months, the food you bought just didn't last and you didn't have money to get more.: Never true  Transportation Needs: No Transportation Needs (10/07/2022)   Received from [facility] - Transportation    In the past 12 months, has lack of transportation kept you from medical appointments or from getting medications?: No    Lack of Transportation (Non-Medical): No  Physical Activity: Not on file  Stress: Not on file  Social Connections: Not on file  Intimate Partner Violence: Not on file       PHYSICAL EXAM  Vitals:   03/13/24 1036  BP: 93/67  Pulse: 67  Weight: 130 lb 8 oz (59.2 kg)  Height: 5' 4 (1.626 m)    Body mass index is 22.4 kg/m.   General: The patient is well-developed and well-nourished and in no acute distress  HEENT:  Head is [place]/AT.  Sclera are anicteric.      Skin: Extremities are without rash or edema.  Musculoskeletal:  Back is nontender  Neurologic Exam  Mental status: The patient is alert and oriented x 3 at the time of the examination. The patient has apparent normal recent and remote memory, with an apparently normal attention span and concentration ability.   Speech is normal.  Cranial nerves: Extraocular movements are full.  COlor vision is symmetric.Facial symmetry is present. There is good facial sensation to soft touch bilaterally.Facial strength is normal.  Trapezius and sternocleidomastoid strength is normal. No dysarthria is noted.  The tongue is midline, and the patient has symmetric elevation of the soft palate. No obvious hearing deficits are noted.  Motor: She has an intention tremor in the hands with a low amplitude.  Muscle bulk is normal.   Tone is normal. Strength is  5 / 5 in all 4 extremities.   Sensory: Sensory testing is intact to pinprick, soft touch and vibration sensation in all 4 extremities.  Coordination: Cerebellar testing reveals  good finger-nose-finger and heel-to-shin bilaterally.  Gait and station: Station is normal.   Gait is normal. Tandem gait is slightly wide.  Romberg is negative. Reflexes: Deep tendon reflexes are symmetric and normal in arms and 3 at knees, 2 at ankles.       DIAGNOSTIC DATA (LABS, IMAGING, TESTING) - I reviewed patient records, labs, notes, testing and imaging myself where available.  Lab Results  Component Value Date   WBC 13.6 (H) 11/15/2023   HGB 13.1 11/15/2023   HCT 38.4 11/15/2023   MCV 96.7 11/15/2023   PLT 281 11/15/2023      Component Value Date/Time   NA 141 11/15/2023 0839   NA 142 10/30/2020 0807   K 4.1 11/15/2023 0839   CL 105 11/15/2023 0839   CO2 26 11/15/2023 0839   GLUCOSE 121 (H) 11/15/2023 0839   BUN 15 11/15/2023 0839   BUN 11 10/30/2020 0807   CREATININE 0.77 11/15/2023 0839   CALCIUM 9.1 11/15/2023 0839   PROT 6.2 (L) 11/15/2023 0839   PROT 7.1 10/30/2020 0807   ALBUMIN 4.3  11/15/2023 0839   ALBUMIN 4.8 10/30/2020 0807   AST 9 (L) 11/15/2023 0839   ALT 8 11/15/2023 0839   ALKPHOS 67 11/15/2023 0839   BILITOT 0.4 11/15/2023 0839   BILITOT <0.2 10/30/2020 0807   GFRNONAA >60 11/15/2023 0839   GFRAA 125 10/30/2020 0807   Lab Results  Component Value Date   CHOL 212 (H) 11/07/2020   HDL 59.40 11/07/2020   LDLCALC 124 (H) 11/07/2020   TRIG 141.0 11/07/2020   CHOLHDL 4 11/07/2020   Lab Results  Component Value Date   HGBA1C 5.6 05/16/2019   Lab Results  Component Value Date   VITAMINB12 350 06/10/2022   Lab Results  Component Value Date   TSH 3.60 11/07/2020       ASSESSMENT AND PLAN  Multiple sclerosis (HCC) - Plan: CBC with Differential/Platelet  High risk medication use - Plan: CBC with Differential/Platelet  Gait disturbance  Migraine with aura and without status migrainosus, not intractable  Essential tremor  Leiomyoma of liver   She provide to switch back to glatiramer  if it is covered by her insurance and we  will go ahead and try to get her authorized.  If co-pay is high, she will stay on Vumerity.  Continue tizanidine , gabapentin , Qulipta  PET is much better on propranolol  and she will continue. Rtc 6 months, sooner if new or worsening symptoms.     Corrie Brannen A. Vear, MD, [facility] 03/13/2024, 12:10 PM Certified in Neurology, Clinical Neurophysiology, Sleep Medicine and Neuroimaging  [facility] [address], [address] [place], [place] [address] [phone]

## 2024-03-14 ENCOUNTER — Ambulatory Visit: Payer: Self-pay | Admitting: Neurology

## 2024-03-14 LAB — CBC WITH DIFFERENTIAL/PLATELET
Basophils Absolute: 0.1 10*3/uL (ref 0.0–0.2)
Basos: 1 %
EOS (ABSOLUTE): 0.2 10*3/uL (ref 0.0–0.4)
Eos: 2 %
Hematocrit: 43.1 % (ref 34.0–46.6)
Hemoglobin: 14.1 g/dL (ref 11.1–15.9)
Immature Grans (Abs): 0.1 10*3/uL (ref 0.0–0.1)
Immature Granulocytes: 1 %
Lymphocytes Absolute: 2 10*3/uL (ref 0.7–3.1)
Lymphs: 19 %
MCH: 33 pg (ref 26.6–33.0)
MCHC: 32.7 g/dL (ref 31.5–35.7)
MCV: 101 fL — ABNORMAL HIGH (ref 79–97)
Monocytes Absolute: 0.6 10*3/uL (ref 0.1–0.9)
Monocytes: 6 %
Neutrophils Absolute: 7.9 10*3/uL — ABNORMAL HIGH (ref 1.4–7.0)
Neutrophils: 71 %
Platelets: 271 10*3/uL (ref 150–450)
RBC: 4.27 x10E6/uL (ref 3.77–5.28)
RDW: 12 % (ref 11.7–15.4)
WBC: 10.8 10*3/uL (ref 3.4–10.8)

## 2024-03-14 NOTE — Telephone Encounter (Signed)
 Completed PA for glatiramer  via [facility]. Sent to [facility]. Key: [id number]. Should have a determination within 1-3 business days.

## 2024-03-18 ENCOUNTER — Encounter: Payer: Self-pay | Admitting: Neurology

## 2024-03-19 ENCOUNTER — Other Ambulatory Visit: Payer: Self-pay | Admitting: *Deleted

## 2024-03-19 MED ORDER — TAMSULOSIN HCL 0.4 MG PO CAPS: 0.4000 mg | ORAL_CAPSULE | Freq: Every day | ORAL | 11 refills | Status: AC

## 2024-04-02 NOTE — Addendum Note (Signed)
 Addended by: MINGO NEPTUNE A on: 04/02/2024 11:27 AM   Modules accepted: Orders

## 2024-04-12 ENCOUNTER — Encounter: Attending: Physical Medicine and Rehabilitation | Admitting: Physical Medicine and Rehabilitation

## 2024-04-12 ENCOUNTER — Encounter: Payer: Self-pay | Admitting: Physical Medicine and Rehabilitation

## 2024-04-12 VITALS — BP 90/64 | HR 76 | Ht 64.0 in | Wt 138.0 lb

## 2024-04-12 DIAGNOSIS — M542 Cervicalgia: Secondary | ICD-10-CM | POA: Insufficient documentation

## 2024-04-12 DIAGNOSIS — M7500 Adhesive capsulitis of unspecified shoulder: Secondary | ICD-10-CM | POA: Diagnosis present

## 2024-04-12 DIAGNOSIS — G4733 Obstructive sleep apnea (adult) (pediatric): Secondary | ICD-10-CM | POA: Diagnosis not present

## 2024-04-12 DIAGNOSIS — G894 Chronic pain syndrome: Secondary | ICD-10-CM | POA: Insufficient documentation

## 2024-04-12 DIAGNOSIS — Z79899 Other long term (current) drug therapy: Secondary | ICD-10-CM | POA: Diagnosis not present

## 2024-04-12 DIAGNOSIS — M7502 Adhesive capsulitis of left shoulder: Secondary | ICD-10-CM | POA: Diagnosis not present

## 2024-04-12 DIAGNOSIS — G35 Multiple sclerosis: Secondary | ICD-10-CM

## 2024-04-12 MED ORDER — OXYCODONE-ACETAMINOPHEN 10-325 MG PO TABS: 1.0000 | ORAL_TABLET | Freq: Two times a day (BID) | ORAL | 0 refills | Status: DC | PRN

## 2024-04-12 NOTE — Progress Notes (Signed)
 Subjective:    Patient ID: [name], female    DOB: 15-Mar-1976, 48 y.o.   MRN: [id number]  HPI [name] is a 48 year old woman who presents to establish care for left sided frozen shoulder  1) Frozen shoulder, left side -steroid injection helped -not much better today -feels ike it has slipped out of th socket -has not had MRI -failed diclofenac , mobic , ibuprofen  -she has tried exercises at home. -hurts to lay on her shoulder to sleep at night  2) Cervical spine pain  3) Multiple sclerosis: -has aching pain in legs -on and off at night  Pain Inventory Average Pain 5 Pain Right Now 4 My pain is constant, sharp, dull, stabbing, and aching  In the last 24 hours, has pain interfered with the following? General activity 4 Relation with others 1 Enjoyment of life 10 What TIME of day is your pain at its worst? morning , evening, and night Sleep (in general) Poor  Pain is worse with: walking, bending, inactivity, standing, and some activites Pain improves with: rest, heat/ice, medication, and lidocaine  patch Relief from Meds: 7         Family History  Problem Relation Age of Onset   ADD / ADHD Mother    Colon polyps Mother    Colon polyps Father    Colon cancer Maternal Grandmother    Colon polyps Maternal Grandfather    Diabetes Maternal Grandfather    Colon polyps Paternal Grandmother    Colon polyps Paternal Grandfather    Social History   Socioeconomic History   Marital status: Divorced    Spouse name: Will   Number of children: 1   Years of education: Not on file   Highest education level: Not on file  Occupational History    Employer: [employer]   Occupation: CMA  Tobacco Use   Smoking status: Former    Current packs/day: 0.00    Average packs/day: 18.0 packs/day for 1 year (18.0 ttl pk-yrs)    Types: Cigarettes    Start date: 12/30/2011    Quit date: 12/29/2012    Years since quitting: 11.2   Smokeless tobacco: Never  Vaping Use    Vaping status: Never Used  Substance and Sexual Activity   Alcohol use: Yes    Alcohol/week: 1.0 - 2.0 standard drink of alcohol    Types: 1 - 2 Standard drinks or equivalent per week   Drug use: No   Sexual activity: Yes    Partners: Male    Birth control/protection: Pill  Other Topics Concern   Not on file  Social History Narrative   Not on file   Social Drivers of Health   Financial Resource Strain: Low Risk  (10/07/2022)   Received from [facility]   Overall Financial Resource Strain (CARDIA)    Difficulty of Paying Living Expenses: Not hard at all  Food Insecurity: No Food Insecurity (10/07/2022)   Received from [facility]   Hunger Vital Sign    Within the past 12 months, you worried that your food would run out before you got the money to buy more.: Never true    Within the past 12 months, the food you bought just didn't last and you didn't have money to get more.: Never true  Transportation Needs: No Transportation Needs (10/07/2022)   Received from [facility]    In the past 12 months, has lack of transportation  kept you from medical appointments or from getting medications?: No    Lack of Transportation (Non-Medical): No  Physical Activity: Not on file  Stress: Not on file  Social Connections: Not on file   Past Surgical History:  Procedure Laterality Date   DE QUERVAIN'S RELEASE Bilateral    DILATATION & CURETTAGE/HYSTEROSCOPY WITH MYOSURE N/A 10/24/2017   Procedure: DILATATION & CURETTAGE/HYSTEROSCOPY WITH MYOSURE;  Surgeon: Jannis Kate Norris, MD;  Location: [facility];  Service: Gynecology;  Laterality: N/A;   IUD REMOVAL N/A 10/24/2017   Procedure: INTRAUTERINE DEVICE (IUD) REMOVAL;  Surgeon: Jertson, Jill Evelyn, MD;  Location: [facility];  Service: Gynecology;  Laterality: N/A;   TONSILLECTOMY     WISDOM TOOTH EXTRACTION     WRIST SURGERY Bilateral     2 ganglion cyst removed   Past Medical History:  Diagnosis Date   Anxiety    Carpal tunnel syndrome    Chronic lower back pain    Chronic tension headaches    Constipation    Dysmenorrhea    Endometrial polyp    Fatigue    Frozen shoulder    GERD (gastroesophageal reflux disease)    History of lumbar puncture 08/2016   HPV in female    Low serum vitamin D     Neck pain, chronic    Pelvic pain    Thyroid  nodule    Tinnitus, bilateral    TMJ (temporomandibular joint disorder)    White matter abnormality on MRI of brain    There were no vitals taken for this visit.  Opioid Risk Score:   Fall Risk Score:  `1  Depression screen [facility] 2/9     02/09/2024    9:50 AM 01/10/2024   10:37 AM 11/07/2020   11:15 AM 11/09/2019    1:26 PM 05/16/2019    3:34 PM 06/07/2017    9:29 AM  Depression screen PHQ 2/9  Decreased Interest 0 2 1 1 2  0  Down, Depressed, Hopeless 0 1 0 0 2 0  PHQ - 2 Score 0 3 1 1 4  0  Altered sleeping  2 2 2 3    Tired, decreased energy  2 1 2 2    Change in appetite  0 0 0 0   Feeling bad or failure about yourself   0 0 0 0   Trouble concentrating  1 1 0 0   Moving slowly or fidgety/restless  0 0 0 0   Suicidal thoughts  0 0 0 0   PHQ-9 Score  8 5 5 9    Difficult doing work/chores  Somewhat difficult Not difficult at all Somewhat difficult Somewhat difficult      Review of Systems  Musculoskeletal:        Bilateral leg pain Left upper pain  All other systems reviewed and are negative.      Objective:   Physical Exam Gen: no distress, normal appearing HEENT: oral mucosa pink and moist, NCAT Cardio: Reg rate Chest: normal effort, normal rate of breathing Abd: soft, non-distended Ext: no edema Psych: pleasant, normal affect Skin: intact Neuro: Alert and oriented x3 Musculoskeletal: Left shoulder with limited range of motion- only 70 degrees abduction      Assessment & Plan:  1)Chronic Pain Syndrome secondary to left sided frozen  shoulder -discussed that she has had benefit from percocet in the past but can no longer get them from current prescribed -UDS and pain contract performed today, if with expected metabolites will refill Percocet 10mg   BID prn, discussed that she tries not to take this at work  -MRI left shoulder ordered  -Discussed Qutenza as an option for neuropathic pain control. Discussed that this is a capsaicin patch, stronger than capsaicin cream. Discussed that it is currently approved for diabetic peripheral neuropathy and post-herpetic neuralgia, but that it has also shown benefit in treating other forms of neuropathy. Provided patient with link to site to learn more about the patch: https://www.clark.biz/. Discussed that the patch would be placed in office and benefits usually last 3 months. Discussed that unintended exposure to capsaicin can cause severe irritation of eyes, mucous membranes, respiratory tract, and skin, but that Qutenza is a local treatment and does not have the systemic side effects of other nerve medications. Discussed that there may be pain, itching, erythema, and decreased sensory function associated with the application of Qutenza. Side effects usually subside within 1 week. A cold pack of analgesic medications can help with these side effects. Blood pressure can also be increased due to pain associated with administration of the patch.   -Discussed current symptoms of pain and history of pain.  -Discussed benefits of exercise in reducing pain. -Discussed following foods that may reduce pain: 1) Ginger (especially studied for arthritis)- reduce leukotriene production to decrease inflammation 2) Blueberries- high in phytonutrients that decrease inflammation 3) Salmon- marine omega-3s reduce joint swelling and pain 4) Pumpkin seeds- reduce inflammation 5) dark chocolate- reduces inflammation 6) turmeric- reduces inflammation 7) tart cherries - reduce pain and stiffness 8) extra  virgin olive oil - its compound olecanthal helps to block prostaglandins  9) chili peppers- can be eaten or applied topically via capsaicin 10) mint- helpful for headache, muscle aches, joint pain, and itching 11) garlic- reduces inflammation 12) Green tea- reduces inflammation and oxidative stress, helps with weight loss, may reduce the risk of cancer, recommend Double [employer] of Tea daily  Link to further information on diet for chronic pain: http://www.bray.com/   2) Cervical spine pain -lidocaine  patch 5% -discussed that Journavx  is a highly selective inhibitor for Nav 1.8, which is specific for pain in the peripheral nervous system, discussed that lidocaine  in contrast affects all Nav receptors, discussed loading dose is 2 50mg  tablets and this can be taken 1 hour before food, discussed that then 50mg  can be taken with out without food q12H until pain is tolerable. Discussed that potential side effects are pruritus, muscle spasms, increase blood creatinine, rash. Discussed that we have samples available and we have copay cards available. Discussed that the 2 phase 3 clinicial trials sent to the FDA were for abdominoplasty and bunionectomy, discussed that it has been currently studied for 14 days, discussed that it can reduce efficacy of opioids and benzodiazepines, discussed that it has not be studied in severe hepatic impairment. Discussed that it is contraindicated with fluconazole. Discussed that it can interfere with hormonal contraception up to 28 days after use. Discussed that it can decrease fertility, it has not been studied in pregnancy, that it has been present in animal milk during lactation, not recommended for GFR <15, discussed that outpatient if the medication requires a prior auth the copay should be $30 for at least a 60 day supply. The medication may be more likely to be in stock in [facility] and  [facility]. We do have samples available   3) Multiple sclerosis  -recommended Wahls protocol  4) Insomnia: -melatonin

## 2024-04-12 NOTE — Patient Instructions (Addendum)
 Eat a lot fruits/vegetables  Avoid processed foods, eggs, gluten, dairy  Wahls Protocol

## 2024-04-27 ENCOUNTER — Encounter: Payer: Self-pay | Admitting: Physical Medicine and Rehabilitation

## 2024-05-01 ENCOUNTER — Other Ambulatory Visit: Payer: Self-pay

## 2024-05-01 ENCOUNTER — Encounter (HOSPITAL_BASED_OUTPATIENT_CLINIC_OR_DEPARTMENT_OTHER): Payer: Self-pay

## 2024-05-01 ENCOUNTER — Emergency Department (HOSPITAL_BASED_OUTPATIENT_CLINIC_OR_DEPARTMENT_OTHER)
Admission: EM | Admit: 2024-05-01 | Discharge: 2024-05-01 | Disposition: A | Discharge status: Home / Self Care | Attending: Emergency Medicine | Admitting: Emergency Medicine

## 2024-05-01 DIAGNOSIS — K0889 Other specified disorders of teeth and supporting structures: Secondary | ICD-10-CM | POA: Diagnosis not present

## 2024-05-01 DIAGNOSIS — G8918 Other acute postprocedural pain: Secondary | ICD-10-CM | POA: Insufficient documentation

## 2024-05-01 MED ORDER — OXYCODONE-ACETAMINOPHEN 5-325 MG PO TABS: 1.0000 | ORAL_TABLET | Freq: Four times a day (QID) | ORAL | 0 refills | Status: DC | PRN

## 2024-05-01 MED ORDER — OXYCODONE-ACETAMINOPHEN 5-325 MG PO TABS
2.0000 | ORAL_TABLET | Freq: Once | ORAL | Status: AC
  Administered 2024-05-01: 2 via ORAL
  Filled 2024-05-01: qty 2

## 2024-05-01 MED ORDER — KETOROLAC TROMETHAMINE 60 MG/2ML IM SOLN
30.0000 mg | Freq: Once | INTRAMUSCULAR | Status: AC
  Administered 2024-05-01: 30 mg via INTRAMUSCULAR
  Filled 2024-05-01: qty 2

## 2024-05-01 MED ORDER — KETOROLAC TROMETHAMINE 10 MG PO TABS: 10.0000 mg | ORAL_TABLET | Freq: Four times a day (QID) | ORAL | 0 refills | Status: DC | PRN

## 2024-05-01 NOTE — Discharge Instructions (Addendum)
 Please coordinate with your pain medicine physician and dentist for continued outpatient pain management.  Oral Toradol  has been prescribed which is a nonopiate nonsteroidal anti-inflammatory which is slightly stronger than ibuprofen , recommend you try this in addition to opiates for pain management.

## 2024-05-01 NOTE — ED Triage Notes (Signed)
 Patient reports having 12 teeth pulled today and a denture placed and now she has a lot of pain. She reports the dentist gave her tylenol  with codeine but this caused her a lot of GI distress so she hasn't taken it. The dentist then said all he could do was ibuprofen . Patient reports pain and wants a different medication.

## 2024-05-01 NOTE — ED Provider Notes (Signed)
 [place] EMERGENCY DEPARTMENT AT [facility] Provider Note   CSN: [id number] Arrival date & time: 05/01/24  1558     Patient presents with: Dental Pain   [name] is a 48 y.o. female.    Dental Pain    48 year old female presenting to the emergency department with a need for pain control after having 12 teeth pulled earlier today.  Patient states that she took Tylenol  with codeine which caused GI upset.  Her dentist then recommended ibuprofen .  She is requesting alternative pain control.  Prior to Admission medications   Medication Sig Start Date End Date Taking? Authorizing Provider  AJOVY 225 MG/1.5ML SOAJ Inject 225 mg into the skin every 30 (thirty) days. 03/12/24  Yes [provider]  amitriptyline (ELAVIL) 10 MG tablet Take 10 mg by mouth at bedtime. 01/23/24  Yes [provider]  DAYVIGO 10 MG TABS Take 1 tablet by mouth at bedtime. 04/02/24  Yes [provider]  norelgestromin-ethinyl estradiol  (XULANE) 150-35 MCG/24HR transdermal patch SMARTSIG:Patch T-DERMAL 03/16/24  Yes [provider]  NURTEC 75 MG TBDP Take 1 tablet by mouth daily as needed. 04/18/24  Yes [provider]  QUEtiapine (SEROQUEL) 50 MG tablet Take 50 mg by mouth at bedtime. 04/15/24  Yes [provider]  buPROPion  (WELLBUTRIN  XL) 300 MG 24 hr tablet Take 300 mg by mouth daily.    [provider]  DULoxetine (CYMBALTA) 60 MG capsule Take 60 mg by mouth daily. 07/24/23   [provider]  esomeprazole  (NEXIUM ) 40 MG capsule Take 1 capsule (40 mg total) by mouth daily. 10/20/21 04/12/24  Prentiss Frieze, DO  gabapentin  (NEURONTIN ) 600 MG tablet Take 600mg  in the morning, 600mg  in the afternoon and 1200mg  at bedtime 08/08/23   Sater, Charlie LABOR, MD  Glatiramer  Acetate 40 MG/ML SOSY Inject 40 mg into the skin 3 (three) times a week.    [provider]  ibuprofen  (ADVIL ) 200 MG tablet Take 200 mg by mouth every 6 (six) hours as  needed. 3x daily    [provider]  lamoTRIgine  (LAMICTAL ) 200 MG tablet One po  bid 03/13/24   Sater, Charlie LABOR, MD  lidocaine  (LIDODERM ) 5 % Place 1 patch onto the skin daily. Remove & Discard patch within 12 hours or as directed by MD 01/10/24   Raulkar, Sven SQUIBB, MD  oxyCODONE -acetaminophen  (PERCOCET) 10-325 MG tablet Take 1 tablet by mouth 2 (two) times daily as needed for pain. 04/12/24   Raulkar, Sven SQUIBB, MD  propranolol  ER (INDERAL  LA) 60 MG 24 hr capsule Take 1 capsule (60 mg total) by mouth daily. 09/01/23   Sater, Charlie LABOR, MD  scopolamine  (TRANSDERM-SCOP) 1 MG/3DAYS Place 1 patch onto the skin every 3 days as directed. 12/07/21   Prentiss Frieze, DO  spironolactone (ALDACTONE) 50 MG tablet Take 50 mg by mouth daily.    [provider]  tamsulosin  (FLOMAX ) 0.4 MG CAPS capsule Take 1 capsule (0.4 mg total) by mouth daily. 03/19/24   Sater, Charlie LABOR, MD  tiZANidine  (ZANAFLEX ) 4 MG tablet TAKE 1 TABLET BY MOUTH IN MORNING,1 TAB AT NOON,1 TAB IN EVENING,AND 1TAB AT BEDTIME 02/21/24   Sater, Charlie LABOR, MD    Allergies: Codeine, Dexlansoprazole , Pseudoeph-bromphen-dm, and Other    Review of Systems  All other systems reviewed and are negative.   Updated Vital Signs BP (!) 108/92 (BP Location: Right Arm)   Pulse 85   Temp (!) 97.1 F (36.2 C)   Resp 18  SpO2 100%   Physical Exam Vitals and nursing note reviewed.  Constitutional:      General: She is not in acute distress. HENT:     Head: Normocephalic and atraumatic.     Comments: Status post removal of multiple posterior lower molars, mild oozing of the gums, overall hemostatic Eyes:     Conjunctiva/sclera: Conjunctivae normal.     Pupils: Pupils are equal, round, and reactive to light.  Cardiovascular:     Rate and Rhythm: Normal rate and regular rhythm.  Pulmonary:     Effort: Pulmonary effort is normal. No respiratory distress.  Abdominal:     General: There is no distension.     Tenderness: There is  no guarding.  Musculoskeletal:        General: No deformity or signs of injury.     Cervical back: Neck supple.  Skin:    Findings: No lesion or rash.  Neurological:     General: No focal deficit present.     Mental Status: She is alert. Mental status is at baseline.     (all labs ordered are listed, but only abnormal results are displayed) Labs Reviewed - No data to display  EKG: None  Radiology: No results found.   Procedures   Medications Ordered in the ED  oxyCODONE -acetaminophen  (PERCOCET/ROXICET) 5-325 MG per tablet 2 tablet (2 tablets Oral Given 05/01/24 1737)  ketorolac  (TORADOL ) injection 30 mg (30 mg Intramuscular Given 05/01/24 1738)                                    Medical Decision Making    48 year old female presenting to the emergency department with a need for pain control after having 12 teeth pulled earlier today.  Patient states that she took Tylenol  with codeine which caused GI upset.  Her dentist then recommended ibuprofen .  She is requesting alternative pain control.  Arrival, the patient was vitally stable.  Presenting with need for further pain control in the setting of recent tooth extraction.  Patient states that she was antibiosis with clindamycin prior to procedure.  States that she did not tolerate Tylenol  with codeine, stated she developed severe abdominal cramping after she took it.  Currently denies any abdominal pain.  Requesting alternative pain management options.  She has tolerated Percocet in the past and this was provided. IM Toradol  also administered.   PDMP reviewed: Pt chronically on opiate therapy. She states that she has discussed her ER presentation for pain control with her pain medicine clinic. I explained to her that given her baseline opiate use, she will have built up a tolerance and may have an increased requirement for opiates to maintain pain control in the setting of her acute pain post-operatively.  Will trial multimodal  pain control with oral Toradol  as a stronger NSAID in addition to additional Percocet medication outpatient for short course. No adverse effects noted after Percocet administration in the ED, patient notably tolerating p.o. intake, overall stable for continue follow-up with her dentist and pain clinic.     Final diagnoses:  Acute post-operative pain    ED Discharge Orders     None          Jerrol Agent, MD 05/01/24 1756

## 2024-05-11 ENCOUNTER — Encounter: Attending: Physical Medicine and Rehabilitation | Admitting: Registered Nurse

## 2024-05-11 VITALS — BP 110/75 | HR 94 | Ht 64.0 in | Wt 126.0 lb

## 2024-05-11 DIAGNOSIS — Z5181 Encounter for therapeutic drug level monitoring: Secondary | ICD-10-CM

## 2024-05-11 DIAGNOSIS — G894 Chronic pain syndrome: Secondary | ICD-10-CM | POA: Diagnosis present

## 2024-05-11 DIAGNOSIS — M792 Neuralgia and neuritis, unspecified: Secondary | ICD-10-CM | POA: Diagnosis present

## 2024-05-11 DIAGNOSIS — Z79891 Long term (current) use of opiate analgesic: Secondary | ICD-10-CM

## 2024-05-11 DIAGNOSIS — M7502 Adhesive capsulitis of left shoulder: Secondary | ICD-10-CM

## 2024-05-11 DIAGNOSIS — G35 Multiple sclerosis: Secondary | ICD-10-CM

## 2024-05-11 MED ORDER — OXYCODONE-ACETAMINOPHEN 10-325 MG PO TABS: 1.0000 | ORAL_TABLET | Freq: Two times a day (BID) | ORAL | 0 refills | Status: DC | PRN

## 2024-05-11 NOTE — Progress Notes (Signed)
 Subjective:    Patient ID: [name], female    DOB: 05/31/1976, 48 y.o.   MRN: [id number]  HPI: [name] is a 48 y.o. female who returns for follow up appointment for chronic pain and medication refill.She  states her pain is located in her left shoulder, and lower back. She also reports tingling in her finger tips and toes. She rates her pain 6. Her current exercise regime is walking and performing stretching exercises.  [name] Morphine  equivalent is 30.00 MME.   Oral Swab was Performed today.     Pain Inventory Average Pain 4 Pain Right Now 6 My pain is aching  In the last 24 hours, has pain interfered with the following? General activity 0 Relation with others 0 Enjoyment of life 2 What TIME of day is your pain at its worst? night Sleep (in general) Poor  Pain is worse with: bending and standing Pain improves with: rest, heat/ice, and medication Relief from Meds: 8  Family History  Problem Relation Age of Onset   ADD / ADHD Mother    Colon polyps Mother    Colon polyps Father    Colon cancer Maternal Grandmother    Colon polyps Maternal Grandfather    Diabetes Maternal Grandfather    Colon polyps Paternal Grandmother    Colon polyps Paternal Grandfather    Social History   Socioeconomic History   Marital status: Divorced    Spouse name: Will   Number of children: 1   Years of education: Not on file   Highest education level: Not on file  Occupational History    Employer: [employer]   Occupation: CMA  Tobacco Use   Smoking status: Former    Current packs/day: 0.00    Average packs/day: 18.0 packs/day for 1 year (18.0 ttl pk-yrs)    Types: Cigarettes    Start date: 12/30/2011    Quit date: 12/29/2012    Years since quitting: 11.3   Smokeless tobacco: Never  Vaping Use   Vaping status: Never Used  Substance and Sexual Activity   Alcohol use: Yes    Alcohol/week: 1.0 - 2.0 standard drink of alcohol    Types: 1 - 2 Standard drinks or equivalent  per week   Drug use: No   Sexual activity: Yes    Partners: Male    Birth control/protection: Pill  Other Topics Concern   Not on file  Social History Narrative   Not on file   Social Drivers of Health   Financial Resource Strain: Low Risk  (10/07/2022)   Received from [facility]   Overall Financial Resource Strain (CARDIA)    Difficulty of Paying Living Expenses: Not hard at all  Food Insecurity: No Food Insecurity (10/07/2022)   Received from [facility]   Hunger Vital Sign    Within the past 12 months, you worried that your food would run out before you got the money to buy more.: Never true    Within the past 12 months, the food you bought just didn't last and you didn't have money to get more.: Never true  Transportation Needs: No Transportation Needs (10/07/2022)   Received from [facility] - Transportation    In the past 12 months, has lack of transportation kept you from medical appointments or from getting medications?: No    Lack of Transportation (Non-Medical): No  Physical Activity: Not on file  Stress: Not on file  Social Connections: Not on file   Past Surgical History:  Procedure Laterality Date   DE QUERVAIN'S RELEASE Bilateral    DILATATION & CURETTAGE/HYSTEROSCOPY WITH MYOSURE N/A 10/24/2017   Procedure: DILATATION & CURETTAGE/HYSTEROSCOPY WITH MYOSURE;  Surgeon: Jannis Kate Norris, MD;  Location: [facility] [place];  Service: Gynecology;  Laterality: N/A;   IUD REMOVAL N/A 10/24/2017   Procedure: INTRAUTERINE DEVICE (IUD) REMOVAL;  Surgeon: Jertson, Jill Evelyn, MD;  Location: [facility];  Service: Gynecology;  Laterality: N/A;   TONSILLECTOMY     WISDOM TOOTH EXTRACTION     WRIST SURGERY Bilateral    2 ganglion cyst removed   Past Surgical History:  Procedure Laterality Date   DE QUERVAIN'S RELEASE Bilateral    DILATATION & CURETTAGE/HYSTEROSCOPY WITH MYOSURE N/A  10/24/2017   Procedure: DILATATION & CURETTAGE/HYSTEROSCOPY WITH MYOSURE;  Surgeon: Jannis Kate Norris, MD;  Location: [facility];  Service: Gynecology;  Laterality: N/A;   IUD REMOVAL N/A 10/24/2017   Procedure: INTRAUTERINE DEVICE (IUD) REMOVAL;  Surgeon: Jertson, Jill Evelyn, MD;  Location: [facility];  Service: Gynecology;  Laterality: N/A;   TONSILLECTOMY     WISDOM TOOTH EXTRACTION     WRIST SURGERY Bilateral    2 ganglion cyst removed   Past Medical History:  Diagnosis Date   Anxiety    Carpal tunnel syndrome    Chronic lower back pain    Chronic tension headaches    Constipation    Dysmenorrhea    Endometrial polyp    Fatigue    Frozen shoulder    GERD (gastroesophageal reflux disease)    History of lumbar puncture 08/2016   HPV in female    Low serum vitamin D     Neck pain, chronic    Pelvic pain    Thyroid  nodule    Tinnitus, bilateral    TMJ (temporomandibular joint disorder)    White matter abnormality on MRI of brain    BP 110/75   Pulse 94   Ht 5' 4 (1.626 m)   Wt 126 lb (57.2 kg)   SpO2 96%   BMI 21.63 kg/m   Opioid Risk Score:   Fall Risk Score:  `1  Depression screen [facility] 2/9     05/11/2024   10:06 AM 04/12/2024   10:07 AM 02/09/2024    9:50 AM 01/10/2024   10:37 AM 11/07/2020   11:15 AM 11/09/2019    1:26 PM 05/16/2019    3:34 PM  Depression screen PHQ 2/9  Decreased Interest 0 0 0 2 1 1 2   Down, Depressed, Hopeless 0 0 0 1 0 0 2  PHQ - 2 Score 0 0 0 3 1 1 4   Altered sleeping    2 2 2 3   Tired, decreased energy    2 1 2 2   Change in appetite    0 0 0 0  Feeling bad or failure about yourself     0 0 0 0  Trouble concentrating    1 1 0 0  Moving slowly or fidgety/restless    0 0 0 0  Suicidal thoughts    0 0 0 0  PHQ-9 Score    8 5 5 9   Difficult doing work/chores    Somewhat difficult Not difficult at all Somewhat difficult Somewhat difficult     Review of Systems  Gastrointestinal:  Positive for abdominal  pain.  Musculoskeletal:  Positive for back pain and neck pain.  All other systems reviewed  and are negative.      Objective:   Physical Exam Vitals and nursing note reviewed.  Constitutional:      Appearance: Normal appearance.  Cardiovascular:     Rate and Rhythm: Normal rate and regular rhythm.     Pulses: Normal pulses.     Heart sounds: Normal heart sounds.  Pulmonary:     Effort: Pulmonary effort is normal.     Breath sounds: Normal breath sounds.  Musculoskeletal:     Comments: Normal Muscle Bulk and Muscle Testing Reveals:  Upper Extremities: Full ROM and Muscle Strength 5/5  Lumbar Paraspinal Tenderness: L-4-L-5 Lower Extremities: Full ROM and Muscle Strength 5/5 Arises from Table with ease Narrow Based  Gait     Skin:    General: Skin is warm and dry.  Neurological:     Mental Status: She is alert and oriented to person, place, and time.  Psychiatric:        Mood and Affect: Mood normal.        Behavior: Behavior normal.          Assessment & Plan:  Adhesive Capsulitis of left shoulder: Continue HEP a Tolerated. Continue to Monitor. 05/11/2024 Pain at surgical site: No complaints today. Continue current medication regimen. Continue to Monitor. 05/11/2024 Neuropathic Pain: She is scheduled for Qutenza with Dr Onnie. Continue to Monitor. 05/11/2024 Multiple Sclerosis : Neurology Following. Continue to Monitor 05/11/2024 Chronic Pain Syndrome: Refilled: Oxycodone  10/325 mg one tablet twice a day as needed for pain #60. Second script sent for the following month. We will continue the opioid monitoring program, this consists of regular clinic visits, examinations, urine drug screen, pill counts as well as use of [place]  Controlled Substance Reporting system. A 12 month History has been reviewed on the [place]  Controlled Substance Reporting System on 05/11/2024     F/U with Dr Lorilee

## 2024-05-15 LAB — DRUG TOX MONITOR 1 W/CONF, ORAL FLD
Amphetamines: NEGATIVE ng/mL (ref <10)
Barbiturates: NEGATIVE ng/mL (ref <10)
Benzodiazepines: NEGATIVE ng/mL (ref <0.50)
Buprenorphine: NEGATIVE ng/mL (ref <0.10)
Cocaine: NEGATIVE ng/mL (ref <5.0)
Cotinine: 44.9 ng/mL — ABNORMAL HIGH (ref <5.0)
Fentanyl: NEGATIVE ng/mL (ref <0.10)
Heroin Metabolite: NEGATIVE ng/mL (ref <1.0)
MARIJUANA: NEGATIVE ng/mL (ref <2.5)
MDMA: NEGATIVE ng/mL (ref <10)
Meprobamate: NEGATIVE ng/mL (ref <2.5)
Methadone: NEGATIVE ng/mL (ref <5.0)
Nicotine Metabolite: POSITIVE ng/mL — AB (ref <5.0)
Opiates: NEGATIVE ng/mL (ref <2.5)
Phencyclidine: NEGATIVE ng/mL (ref <10)
Tapentadol: NEGATIVE ng/mL (ref <5.0)
Tramadol: NEGATIVE ng/mL (ref <5.0)
Zolpidem: NEGATIVE ng/mL (ref <5.0)

## 2024-05-15 LAB — DRUG TOX ALC METAB W/CON, ORAL FLD: Alcohol Metabolite: NEGATIVE ng/mL (ref <25)

## 2024-05-19 ENCOUNTER — Telehealth: Admitting: Nurse Practitioner

## 2024-05-19 DIAGNOSIS — R399 Unspecified symptoms and signs involving the genitourinary system: Secondary | ICD-10-CM | POA: Diagnosis not present

## 2024-05-19 MED ORDER — NITROFURANTOIN MONOHYD MACRO 100 MG PO CAPS: 100.0000 mg | ORAL_CAPSULE | Freq: Two times a day (BID) | ORAL | 0 refills | Status: AC

## 2024-05-19 NOTE — Progress Notes (Signed)
 I have spent 5 minutes in review of e-visit questionnaire, review and updating patient chart, medical decision making and response to patient.   Claiborne Rigg, NP

## 2024-05-19 NOTE — Progress Notes (Signed)

## 2024-05-20 ENCOUNTER — Encounter: Payer: Self-pay | Admitting: Registered Nurse

## 2024-05-29 ENCOUNTER — Other Ambulatory Visit: Payer: Self-pay | Admitting: Neurology

## 2024-05-29 NOTE — Telephone Encounter (Signed)
 Last seen on 03/13/24 Follow up scheduled on 10/11/24

## 2024-06-11 ENCOUNTER — Telehealth: Payer: Self-pay | Admitting: Family Medicine

## 2024-06-11 ENCOUNTER — Encounter: Payer: Self-pay | Admitting: Neurology

## 2024-06-11 NOTE — Telephone Encounter (Signed)
 LVM and sent mychart msg informing pt of need to reschedule 10/11/24 appt - NP schedule change

## 2024-06-29 ENCOUNTER — Encounter: Attending: Physical Medicine and Rehabilitation | Admitting: Registered Nurse

## 2024-06-29 ENCOUNTER — Encounter: Payer: Self-pay | Admitting: Registered Nurse

## 2024-06-29 VITALS — BP 90/64 | HR 88 | Ht 64.0 in | Wt 131.0 lb

## 2024-06-29 DIAGNOSIS — M545 Low back pain, unspecified: Secondary | ICD-10-CM | POA: Insufficient documentation

## 2024-06-29 DIAGNOSIS — M546 Pain in thoracic spine: Secondary | ICD-10-CM | POA: Diagnosis present

## 2024-06-29 DIAGNOSIS — G894 Chronic pain syndrome: Secondary | ICD-10-CM | POA: Insufficient documentation

## 2024-06-29 DIAGNOSIS — G8929 Other chronic pain: Secondary | ICD-10-CM | POA: Insufficient documentation

## 2024-06-29 DIAGNOSIS — M542 Cervicalgia: Secondary | ICD-10-CM | POA: Diagnosis present

## 2024-06-29 DIAGNOSIS — M25511 Pain in right shoulder: Secondary | ICD-10-CM | POA: Diagnosis present

## 2024-06-29 MED ORDER — OXYCODONE-ACETAMINOPHEN 10-325 MG PO TABS: 1.0000 | ORAL_TABLET | Freq: Two times a day (BID) | ORAL | 0 refills | Status: AC | PRN

## 2024-06-29 MED ORDER — OXYCODONE-ACETAMINOPHEN 10-325 MG PO TABS: 1.0000 | ORAL_TABLET | Freq: Two times a day (BID) | ORAL | 0 refills | Status: DC | PRN

## 2024-06-29 NOTE — Progress Notes (Unsigned)
 Subjective:    Patient ID: [name], female    DOB: 1976-07-21, 48 y.o.   MRN: [id number]  HPI: [name] is a 48 y.o. female who returns for follow up appointment for chronic pain and medication refill. states *** pain is located in  ***. rates pain ***. current exercise regime is walking and performing stretching exercises.  [name] Morphine  equivalent is *** MME.   Last Oral Swab was Performed 05/11/2024, it was consistent.    Pain Inventory Average Pain 6 Pain Right Now 7 My pain is sharp, dull, stabbing, tingling, and aching  In the last 24 hours, has pain interfered with the following? General activity 6 Relation with others 4 Enjoyment of life 5 What TIME of day is your pain at its worst? morning , evening, and night Sleep (in general) Poor  Pain is worse with: walking, bending, standing, and some activites Pain improves with: rest, heat/ice, and medication Relief from Meds: 6  Family History  Problem Relation Age of Onset   ADD / ADHD Mother    Colon polyps Mother    Colon polyps Father    Colon cancer Maternal Grandmother    Colon polyps Maternal Grandfather    Diabetes Maternal Grandfather    Colon polyps Paternal Grandmother    Colon polyps Paternal Grandfather    Social History   Socioeconomic History   Marital status: Divorced    Spouse name: Will   Number of children: 1   Years of education: Not on file   Highest education level: Not on file  Occupational History    Employer: [employer]   Occupation: CMA  Tobacco Use   Smoking status: Former    Current packs/day: 0.00    Average packs/day: 18.0 packs/day for 1 year (18.0 ttl pk-yrs)    Types: Cigarettes    Start date: 12/30/2011    Quit date: 12/29/2012    Years since quitting: 11.5   Smokeless tobacco: Never  Vaping Use   Vaping status: Never Used  Substance and Sexual Activity   Alcohol use: Yes    Alcohol/week: 1.0 - 2.0 standard drink of alcohol    Types: 1 - 2 Standard drinks  or equivalent per week   Drug use: No   Sexual activity: Yes    Partners: Male    Birth control/protection: Pill  Other Topics Concern   Not on file  Social History Narrative   Not on file   Social Drivers of Health   Financial Resource Strain: Low Risk  (10/07/2022)   Received from [facility]   Overall Financial Resource Strain (CARDIA)    Difficulty of Paying Living Expenses: Not hard at all  Food Insecurity: No Food Insecurity (10/07/2022)   Received from [facility]   Hunger Vital Sign    Within the past 12 months, you worried that your food would run out before you got the money to buy more.: Never true    Within the past 12 months, the food you bought just didn't last and you didn't have money to get more.: Never true  Transportation Needs: No Transportation Needs (10/07/2022)   Received from [facility]    In the past 12 months, has lack of transportation kept you from medical appointments or from getting medications?: No    Lack of Transportation (Non-Medical): No  Physical Activity: Not on file  Stress: Not on file  Social Connections:  Not on file   Past Surgical History:  Procedure Laterality Date   DE QUERVAIN'S RELEASE Bilateral    DILATATION & CURETTAGE/HYSTEROSCOPY WITH MYOSURE N/A 10/24/2017   Procedure: DILATATION & CURETTAGE/HYSTEROSCOPY WITH MYOSURE;  Surgeon: Jannis Kate Norris, MD;  Location: [facility];  Service: Gynecology;  Laterality: N/A;   IUD REMOVAL N/A 10/24/2017   Procedure: INTRAUTERINE DEVICE (IUD) REMOVAL;  Surgeon: Jertson, Jill Evelyn, MD;  Location: [facility];  Service: Gynecology;  Laterality: N/A;   TONSILLECTOMY     WISDOM TOOTH EXTRACTION     WRIST SURGERY Bilateral    2 ganglion cyst removed   Past Surgical History:  Procedure Laterality Date   DE QUERVAIN'S RELEASE Bilateral    DILATATION & CURETTAGE/HYSTEROSCOPY WITH  MYOSURE N/A 10/24/2017   Procedure: DILATATION & CURETTAGE/HYSTEROSCOPY WITH MYOSURE;  Surgeon: Jannis Kate Norris, MD;  Location: [facility];  Service: Gynecology;  Laterality: N/A;   IUD REMOVAL N/A 10/24/2017   Procedure: INTRAUTERINE DEVICE (IUD) REMOVAL;  Surgeon: Jertson, Jill Evelyn, MD;  Location: [facility];  Service: Gynecology;  Laterality: N/A;   TONSILLECTOMY     WISDOM TOOTH EXTRACTION     WRIST SURGERY Bilateral    2 ganglion cyst removed   Past Medical History:  Diagnosis Date   Anxiety    Carpal tunnel syndrome    Chronic lower back pain    Chronic tension headaches    Constipation    Dysmenorrhea    Endometrial polyp    Fatigue    Frozen shoulder    GERD (gastroesophageal reflux disease)    History of lumbar puncture 08/2016   HPV in female    Low serum vitamin D     Neck pain, chronic    Pelvic pain    Thyroid  nodule    Tinnitus, bilateral    TMJ (temporomandibular joint disorder)    White matter abnormality on MRI of brain    BP (!) 87/64 (BP Location: Left Arm, Patient Position: Sitting, Cuff Size: Normal)   Pulse 88   Ht 5' 4 (1.626 m)   Wt 131 lb (59.4 kg)   SpO2 98%   BMI 22.49 kg/m   Opioid Risk Score:   Fall Risk Score:  `1  Depression screen [facility] 2/9     05/11/2024   10:06 AM 04/12/2024   10:07 AM 02/09/2024    9:50 AM 01/10/2024   10:37 AM 11/07/2020   11:15 AM 11/09/2019    1:26 PM 05/16/2019    3:34 PM  Depression screen PHQ 2/9  Decreased Interest 0 0 0 2 1 1 2   Down, Depressed, Hopeless 0 0 0 1 0 0 2  PHQ - 2 Score 0 0 0 3 1 1 4   Altered sleeping    2 2 2 3   Tired, decreased energy    2 1 2 2   Change in appetite    0 0 0 0  Feeling bad or failure about yourself     0 0 0 0  Trouble concentrating    1 1 0 0  Moving slowly or fidgety/restless    0 0 0 0  Suicidal thoughts    0 0 0 0  PHQ-9 Score    8 5 5 9   Difficult doing work/chores    Somewhat difficult Not difficult at all Somewhat difficult  Somewhat difficult      Review of Systems  Musculoskeletal:  Positive for back pain, myalgias and neck pain.  Neck pain, right shoulder pain, thoracic and low back pain, bilateral leg pain  All other systems reviewed and are negative.      Objective:   Physical Exam        Assessment & Plan:  Adhesive Capsulitis of left shoulder: Continue HEP a Tolerated. Continue to Monitor. 05/11/2024 Pain at surgical site: No complaints today. Continue current medication regimen. Continue to Monitor. 05/11/2024 Neuropathic Pain: She is scheduled for Qutenza with Dr Onnie. Continue to Monitor. 05/11/2024 Multiple Sclerosis : Neurology Following. Continue to Monitor 05/11/2024 Chronic Pain Syndrome: Refilled: Oxycodone  10/325 mg one tablet twice a day as needed for pain #60. Second script sent for the following month. We will continue the opioid monitoring program, this consists of regular clinic visits, examinations, urine drug screen, pill counts as well as use of [place]  Controlled Substance Reporting system. A 12 month History has been reviewed on the [place]  Controlled Substance Reporting System on 05/11/2024     F/U with Dr Lorilee

## 2024-07-05 ENCOUNTER — Other Ambulatory Visit: Payer: Self-pay | Admitting: Neurology

## 2024-07-06 MED ORDER — TIZANIDINE HCL 4 MG PO TABS: ORAL_TABLET | ORAL | 3 refills | Status: AC

## 2024-07-24 ENCOUNTER — Other Ambulatory Visit (HOSPITAL_BASED_OUTPATIENT_CLINIC_OR_DEPARTMENT_OTHER): Payer: Self-pay

## 2024-07-24 ENCOUNTER — Encounter: Payer: Self-pay | Admitting: Neurology

## 2024-07-24 MED ORDER — GABAPENTIN 600 MG PO TABS: ORAL_TABLET | ORAL | 0 refills | Status: DC

## 2024-07-26 ENCOUNTER — Telehealth: Admitting: Physician Assistant

## 2024-07-26 DIAGNOSIS — L309 Dermatitis, unspecified: Secondary | ICD-10-CM | POA: Diagnosis not present

## 2024-07-26 MED ORDER — PREDNISONE 10 MG (21) PO TBPK: ORAL_TABLET | ORAL | 0 refills | Status: DC

## 2024-07-26 NOTE — Progress Notes (Signed)
 E Visit for Rash  We are sorry that you are not feeling well. Here is how we plan to help!  Based on what you shared with me it looks like you have contact dermatitis.  Contact dermatitis is a skin rash caused by something that touches the skin and causes irritation or inflammation.  Your skin may be red, swollen, dry, cracked, and itch.  The rash should go away in a few days but can last a few weeks.  If you get a rash, it's important to figure out what caused it so the irritant can be avoided in the future.  Prednisone  10 mg daily for 6 days (see taper instructions below)  Directions for 6 day taper: Day 1: 2 tablets before breakfast, 1 after both lunch & dinner and 2 at bedtime Day 2: 1 tab before breakfast, 1 after both lunch & dinner and 2 at bedtime Day 3: 1 tab at each meal & 1 at bedtime Day 4: 1 tab at breakfast, 1 at lunch, 1 at bedtime Day 5: 1 tab at breakfast & 1 tab at bedtime Day 6: 1 tab at breakfast   HOME CARE:  Take cool showers and avoid direct sunlight. Apply cool compress or wet dressings. Take a bath in an oatmeal bath.  Sprinkle content of one Aveeno packet under running faucet with comfortably warm water.  Bathe for 15-20 minutes, 1-2 times daily.  Pat dry with a towel. Do not rub the rash. Use hydrocortisone cream. Take an antihistamine like Benadryl  for widespread rashes that itch.  The adult dose of Benadryl  is 25-50 mg by mouth 4 times daily. Caution:  This type of medication may cause sleepiness.  Do not drink alcohol, drive, or operate dangerous machinery while taking antihistamines.  Do not take these medications if you have prostate enlargement.  Read package instructions thoroughly on all medications that you take.  GET HELP RIGHT AWAY IF:  Symptoms don't go away after treatment. Severe itching that persists. If you rash spreads or swells. If you rash begins to smell. If it blisters and opens or develops a yellow-brown crust. You develop a  fever. You have a sore throat. You become short of breath.  MAKE SURE YOU:  Understand these instructions. Will watch your condition. Will get help right away if you are not doing well or get worse.  Thank you for choosing an e-visit. Your e-visit answers were reviewed by a board certified advanced clinical practitioner to complete your personal care plan. Depending upon the condition, your plan could have included both over the counter or prescription medications. Please review your pharmacy choice. Be sure that the pharmacy you have chosen is open so that you can pick up your prescription now.  If there is a problem you may message your provider in MyChart to have the prescription routed to another pharmacy. Your safety is important to us . If you have drug allergies check your prescription carefully.  For the next 24 hours, you can use MyChart to ask questions about today's visit, request a non-urgent call back, or ask for a work or school excuse from your e-visit provider. You will get an email in the next two days asking about your experience. I hope that your e-visit has been valuable and will speed your recovery.  I have spent 5 minutes in review of e-visit questionnaire, review and updating patient chart, medical decision making and response to patient.   Alejandra CHRISTELLA Dickinson, PA-C

## 2024-08-24 ENCOUNTER — Encounter: Payer: Self-pay | Admitting: Neurology

## 2024-08-29 ENCOUNTER — Encounter: Payer: Self-pay | Admitting: Physical Medicine and Rehabilitation

## 2024-08-31 ENCOUNTER — Encounter: Attending: Physical Medicine and Rehabilitation | Admitting: Physical Medicine and Rehabilitation

## 2024-08-31 MED ORDER — OXYCODONE-ACETAMINOPHEN 10-325 MG PO TABS: 1.0000 | ORAL_TABLET | Freq: Two times a day (BID) | ORAL | 0 refills | Status: DC | PRN

## 2024-08-31 NOTE — Progress Notes (Signed)
 Left VM for patient. Percocet refill sent.

## 2024-09-02 ENCOUNTER — Encounter: Payer: Self-pay | Admitting: Neurology

## 2024-09-04 ENCOUNTER — Telehealth: Admitting: Family Medicine

## 2024-09-04 DIAGNOSIS — L309 Dermatitis, unspecified: Secondary | ICD-10-CM

## 2024-09-04 DIAGNOSIS — J019 Acute sinusitis, unspecified: Secondary | ICD-10-CM

## 2024-09-04 DIAGNOSIS — B9689 Other specified bacterial agents as the cause of diseases classified elsewhere: Secondary | ICD-10-CM | POA: Diagnosis not present

## 2024-09-04 MED ORDER — PREDNISONE 20 MG PO TABS: 20.0000 mg | ORAL_TABLET | Freq: Two times a day (BID) | ORAL | 0 refills | Status: AC

## 2024-09-04 MED ORDER — PROMETHAZINE-DM 6.25-15 MG/5ML PO SYRP: 5.0000 mL | ORAL_SOLUTION | Freq: Four times a day (QID) | ORAL | 0 refills | Status: DC | PRN

## 2024-09-04 MED ORDER — AMOXICILLIN-POT CLAVULANATE 875-125 MG PO TABS: 1.0000 | ORAL_TABLET | Freq: Two times a day (BID) | ORAL | 0 refills | Status: DC

## 2024-09-04 MED ORDER — FLUTICASONE PROPIONATE 50 MCG/ACT NA SUSP: 2.0000 | Freq: Every day | NASAL | 6 refills | Status: DC

## 2024-09-04 NOTE — Progress Notes (Signed)
"      E-Visit for Sinus Problems  We are sorry that you are not feeling well.  Here is how we plan to help!  Based on what you have shared with me it looks like you have sinusitis.  Sinusitis is inflammation and infection in the sinus cavities of the head.  Based on your presentation I believe you most likely have Acute Bacterial Sinusitis.  This is an infection caused by bacteria and is treated with antibiotics. I have prescribed Augmentin  875mg /125mg  one tablet twice daily with food, for 7 days. You may use an oral decongestant such as Mucinex D or if you have glaucoma or high blood pressure use plain Mucinex. Saline nasal spray help and can safely be used as often as needed for congestion.  If you develop worsening sinus pain, fever or notice severe headache and vision changes, or if symptoms are not better after completion of antibiotic, please schedule an appointment with a health care provider.   I also sent prednisone , flonase  and promethazine  dm for cough.   Sinus infections are not as easily transmitted as other respiratory infection, however we still recommend that you avoid close contact with loved ones, especially the very young and elderly.  Remember to wash your hands thoroughly throughout the day as this is the number one way to prevent the spread of infection!  Home Care: Only take medications as instructed by your medical team. Complete the entire course of an antibiotic. Do not take these medications with alcohol. A steam or ultrasonic humidifier can help congestion.  You can place a towel over your head and breathe in the steam from hot water coming from a faucet. Avoid close contacts especially the very young and the elderly. Cover your mouth when you cough or sneeze. Always remember to wash your hands.  Get Help Right Away If: You develop worsening fever or sinus pain. You develop a severe head ache or visual changes. Your symptoms persist after you have completed your  treatment plan.  Make sure you Understand these instructions. Will watch your condition. Will get help right away if you are not doing well or get worse.  Your e-visit answers were reviewed by a board certified advanced clinical practitioner to complete your personal care plan.  Depending on the condition, your plan could have included both over the counter or prescription medications.  If there is a problem please reply  once you have received a response from your provider.  Your safety is important to us .  If you have drug allergies check your prescription carefully.    You can use MyChart to ask questions about todays visit, request a non-urgent call back, or ask for a work or school excuse for 24 hours related to this e-Visit. If it has been greater than 24 hours you will need to follow up with your provider, or enter a new e-Visit to address those concerns.  You will get an e-mail in the next two days asking about your experience.  I hope that your e-visit has been valuable and will speed your recovery. Thank you for using e-visits.  I have spent 5 minutes in review of e-visit questionnaire, review and updating patient chart, medical decision making and response to patient.   Ercelle Winkles, FNP     "

## 2024-09-07 ENCOUNTER — Encounter: Payer: Self-pay | Admitting: Family Medicine

## 2024-09-07 ENCOUNTER — Ambulatory Visit: Admitting: Family Medicine

## 2024-09-07 VITALS — BP 84/62 | HR 70 | Ht 64.0 in

## 2024-09-07 DIAGNOSIS — F5101 Primary insomnia: Secondary | ICD-10-CM

## 2024-09-07 DIAGNOSIS — G894 Chronic pain syndrome: Secondary | ICD-10-CM

## 2024-09-07 DIAGNOSIS — F411 Generalized anxiety disorder: Secondary | ICD-10-CM

## 2024-09-07 DIAGNOSIS — G35D Multiple sclerosis, unspecified: Secondary | ICD-10-CM

## 2024-09-07 NOTE — Progress Notes (Signed)
 "   Patient Care Team: Prentiss Frieze, DO as PCP - General (Family Medicine) Melita Drivers, MD as Consulting Physician (Orthopedic Surgery)   Assessment & Plan:   1. Multiple sclerosis (Primary) Followed by Dr. Vear. Medications may need to change again due to lack of financial coverage.   2. Chronic pain syndrome Followed by PMR. Visits reviewed. The current medical regimen is effective;  continue present plan and medications.  3. Generalized anxiety disorder Stable.The current medical regimen is effective;  continue present plan and medications.  4. Primary insomnia Stable. The current medical regimen is effective;  continue present plan and medications.  Frieze Prentiss, DO, MS, FAAFP, Dipl. [facility] [address] [place], [address] Dept: [phone] Dept Fax: [phone]  Subjective:   Review of Systems: Negative, with the exception of above mentioned in HPI.  History:   Reviewed by clinician on day of visit: allergies, medications, problem list, medical history, surgical history, family history, social history, and previous encounter notes.    Medications:   Show/hide medication list[1] Allergies[2]  Objective:   BP (!) 84/62   Pulse 70   Ht 5' 4 (1.626 m)   LMP 08/28/2024   SpO2 98%   BMI 22.49 kg/m    Physical Exam Constitutional:      General: She is not in acute distress.    Appearance: She is well-developed.  HENT:     Head: Normocephalic and atraumatic.  Eyes:     Conjunctiva/sclera: Conjunctivae normal.  Cardiovascular:     Rate and Rhythm: Normal rate and regular rhythm.     Heart sounds: Normal heart sounds.  Pulmonary:     Effort: Pulmonary effort is normal.     Breath sounds: Normal breath sounds.  Neurological:     General: No focal deficit present.     Mental Status: She is alert.  Psychiatric:        Behavior: Behavior normal.       Attestations:   Patient is  well-known to me from previous care setting and is establishing care in this system with me as PCP. Available records reviewed. Chart updated today with reconciliation of problem list, medications, allergies, and relevant history. Preventive care and chronic disease status reviewed.  Outside labs reviewed and will be abstracted. Portions of historical chart may remain incomplete; will update on an ongoing basis as clinically indicated.     [1]  Outpatient Medications Prior to Visit  Medication Sig   AJOVY 225 MG/1.5ML SOAJ Inject 225 mg into the skin every 30 (thirty) days.   buPROPion  (WELLBUTRIN  XL) 300 MG 24 hr tablet Take 300 mg by mouth daily.   DAYVIGO 10 MG TABS Take 1 tablet by mouth at bedtime.   esomeprazole  (NEXIUM ) 40 MG capsule Take 1 capsule (40 mg total) by mouth daily.   fluticasone  (FLONASE ) 50 MCG/ACT nasal spray Place 2 sprays into both nostrils daily.   gabapentin  (NEURONTIN ) 600 MG tablet TAKE 1 TABLET BY MOUTH IN THE  MORNING, 1 TABLET IN THE  AFTERNOON, AND 2 TABLETS AT  BEDTIME   Glatiramer  Acetate 40 MG/ML SOSY Inject 40 mg into the skin 3 (three) times a week.   ibuprofen  (ADVIL ) 200 MG tablet Take 200 mg by mouth every 6 (six) hours as needed. 3x daily   lamoTRIgine  (LAMICTAL ) 200 MG tablet One po  bid   oxyCODONE -acetaminophen  (PERCOCET) 10-325 MG tablet Take 1 tablet by mouth 2 (two) times daily as needed for pain.   [  EXPIRED] predniSONE  (DELTASONE ) 20 MG tablet Take 1 tablet (20 mg total) by mouth 2 (two) times daily with a meal for 5 days.   propranolol  ER (INDERAL  LA) 60 MG 24 hr capsule TAKE 1 CAPSULE BY MOUTH EVERY DAY   spironolactone (ALDACTONE) 50 MG tablet Take 50 mg by mouth daily.   tamsulosin  (FLOMAX ) 0.4 MG CAPS capsule Take 1 capsule (0.4 mg total) by mouth daily.   tiZANidine  (ZANAFLEX ) 4 MG tablet TAKE 1 TABLET BY MOUTH IN MORNING,1 TAB AT NOON,1 TAB IN EVENING,AND 1TAB AT BEDTIME   [DISCONTINUED] amoxicillin -clavulanate (AUGMENTIN ) 875-125 MG tablet  Take 1 tablet by mouth 2 (two) times daily for 10 days.   [DISCONTINUED] predniSONE  (STERAPRED UNI-PAK 21 TAB) 10 MG (21) TBPK tablet 6 day taper; take as directed on package instructions   [DISCONTINUED] promethazine -dextromethorphan (PROMETHAZINE -DM) 6.25-15 MG/5ML syrup Take 5 mLs by mouth 4 (four) times daily as needed for up to 10 days for cough.   No facility-administered medications prior to visit.  [2]  Allergies Allergen Reactions   Codeine Nausea Only and Other (See Comments)    This abdominal pain was following liquid codeine, has tolerated hydrocodone , oxycodone , tramadol  well   Dexlansoprazole  Diarrhea    Constant diarrhea   Pseudoeph-Bromphen-Dm Other (See Comments)    Anxious - couldn't sit still   Other Other (See Comments)    Anxious - couldn't sit still (brompheniramine DM)   "

## 2024-09-11 ENCOUNTER — Other Ambulatory Visit: Payer: Self-pay | Admitting: Neurology

## 2024-09-11 MED ORDER — PREDNISONE 5 MG PO TABS: ORAL_TABLET | ORAL | 0 refills | Status: DC

## 2024-09-11 MED ORDER — PROMETHAZINE-DM 6.25-15 MG/5ML PO SYRP: 5.0000 mL | ORAL_SOLUTION | Freq: Four times a day (QID) | ORAL | 0 refills | Status: DC | PRN

## 2024-09-11 MED ORDER — GLATIRAMER ACETATE 40 MG/ML ~~LOC~~ SOSY: PREFILLED_SYRINGE | SUBCUTANEOUS | 11 refills | Status: AC

## 2024-09-11 NOTE — Addendum Note (Signed)
 Addended by: PRENTISS FRIEZE R on: 09/11/2024 01:26 PM   Modules accepted: Orders

## 2024-09-12 ENCOUNTER — Encounter: Payer: Self-pay | Admitting: Family Medicine

## 2024-09-12 DIAGNOSIS — G894 Chronic pain syndrome: Secondary | ICD-10-CM | POA: Insufficient documentation

## 2024-09-14 ENCOUNTER — Telehealth: Payer: Self-pay

## 2024-09-14 ENCOUNTER — Other Ambulatory Visit (HOSPITAL_COMMUNITY): Payer: Self-pay

## 2024-09-14 NOTE — Telephone Encounter (Signed)
 Pharmacy Patient Advocate Encounter   Received notification from Fax that prior authorization for GLATOPA  BRAND is required/requested.   Insurance verification completed.   The patient is insured through [facility].   Per test claim: PA required; PA submitted to above mentioned insurance via Latent Key/confirmation #/EOC [id number] Status is pending

## 2024-09-17 NOTE — Telephone Encounter (Signed)
 Pharmacy Patient Advocate Encounter  Received notification from [employer] that Prior Authorization for Glatopa  has been APPROVED from 09/14/2024 to 09/12/2038   PA #/Case ID/Reference #: [id number]

## 2024-09-18 ENCOUNTER — Ambulatory Visit: Admitting: Family Medicine

## 2024-09-18 ENCOUNTER — Encounter: Payer: Self-pay | Admitting: Family Medicine

## 2024-09-20 DIAGNOSIS — F411 Generalized anxiety disorder: Secondary | ICD-10-CM

## 2024-09-20 DIAGNOSIS — K219 Gastro-esophageal reflux disease without esophagitis: Secondary | ICD-10-CM

## 2024-09-20 NOTE — Telephone Encounter (Signed)
 Please see message from pt. I have loaded medications to send to her pharmacy. All mentioned meds are pending to be sent if appropriate. Those that she still has refills on can be put on file and that way they come to the correct location when a new script is needed later on. Pt would like to see about starting phentermine and changing her anxiety medication. Does she need to be seen in a follow up first?

## 2024-09-21 MED ORDER — DULOXETINE HCL 60 MG PO CPEP: 60.0000 mg | ORAL_CAPSULE | Freq: Every day | ORAL | 0 refills | Status: AC

## 2024-09-21 MED ORDER — ESOMEPRAZOLE MAGNESIUM 40 MG PO CPDR: 40.0000 mg | DELAYED_RELEASE_CAPSULE | Freq: Every day | ORAL | 1 refills | Status: AC

## 2024-09-21 MED ORDER — BUPROPION HCL ER (XL) 300 MG PO TB24: 300.0000 mg | ORAL_TABLET | Freq: Every day | ORAL | 1 refills | Status: AC

## 2024-09-21 MED ORDER — SPIRONOLACTONE 50 MG PO TABS: 50.0000 mg | ORAL_TABLET | Freq: Every day | ORAL | 1 refills | Status: AC

## 2024-09-21 NOTE — Telephone Encounter (Signed)
 Dr Prentiss, pt is requesting to be started on Phentermine. Is this appropriate or does she need another appt?

## 2024-09-22 ENCOUNTER — Encounter: Payer: Self-pay | Admitting: Neurology

## 2024-09-24 NOTE — Telephone Encounter (Signed)
Pt has been r/s with NP °

## 2024-09-25 ENCOUNTER — Ambulatory Visit: Admitting: Family Medicine

## 2024-09-25 ENCOUNTER — Encounter: Payer: Self-pay | Admitting: Family Medicine

## 2024-09-25 VITALS — Ht 64.0 in | Wt 138.0 lb

## 2024-09-25 DIAGNOSIS — R269 Unspecified abnormalities of gait and mobility: Secondary | ICD-10-CM

## 2024-09-25 DIAGNOSIS — G35D Multiple sclerosis, unspecified: Secondary | ICD-10-CM | POA: Diagnosis not present

## 2024-09-25 DIAGNOSIS — Z79899 Other long term (current) drug therapy: Secondary | ICD-10-CM | POA: Diagnosis not present

## 2024-09-25 DIAGNOSIS — G250 Essential tremor: Secondary | ICD-10-CM | POA: Diagnosis not present

## 2024-09-25 DIAGNOSIS — G43109 Migraine with aura, not intractable, without status migrainosus: Secondary | ICD-10-CM

## 2024-09-25 MED ORDER — GABAPENTIN 600 MG PO TABS: ORAL_TABLET | ORAL | 5 refills | Status: AC

## 2024-09-25 NOTE — Patient Instructions (Signed)
Below is our plan:  We will continue current treatment plan.   Please make sure you are staying well hydrated. I recommend 50-60 ounces daily. Well balanced diet and regular exercise encouraged. Consistent sleep schedule with 6-8 hours recommended.   Please continue follow up with care team as directed.   Follow up with Dr Sater in 6 months   You may receive a survey regarding today's visit. I encourage you to leave honest feed back as I do use this information to improve patient care. Thank you for seeing me today!    

## 2024-09-25 NOTE — Progress Notes (Signed)
 "   Chief Complaint  Patient presents with   RM2/MS    Pt is here Alone. Pt states she has been doing pretty good, balance a little off,but denies any falls or stumbles.     HISTORY OF PRESENT ILLNESS:  09/25/2024 ALL:  [name] is a 49 y.o. female here today for follow up for  RRMS. Last MRI 2023 stable. Labs monitored through PCP.   She feels that she is doing fairly well. She feels off balance at times but denies any significant changes in gait. She has a cane but does not need it unless really tired. No falls.   She feels gabapentin  helps significantly with dysesthesias. She is not sure sure lamotrigine  helps but she is tolerating it.   Propranolol  helps with essential tremor. She also has headaches. Ajovy helps with migriane prevention.   She does have insomnia. She follows Dr Harl with [facility]. She did not have sleep apnea. She recently started Dayvigo. She takes trazodone as needed. Mood is well managed. She has significant anxiety. She continues Wellbutrin . She is also on duloxetine  but more for chronic pain.   No changes in bowel and bladder changes. Flomax  was helpful but she stopped as she felt hesitancy was improved.   HISTORY (copied from Dr Duncan previous note)  [name] is a 49 y.o. woman with possible multiple sclerosis.   UPDATE 03/13/2024 She started glatiramer  40 mg three times per week and tolerated the injections well.  However, she had a high copay so we started Vumerity.   She would prefer to go back on glatiramer .     She has no new exacerbation but has more sensory symptoms.     Gait is doing well.   She uses bannister on stairs.  Balance is mildly off.   Strength and sensation are fine.   Bladder function shows some hesitancy.   Vision is fine.      She has some fatigue daily.    This was worse in heat.    She has noted cognitive issues including reduced focus at work and word finding - started in 2023.   She has some depression and takes  Wellbutrin .  She has some anxiety.      Both legs ache at times.   Nothing makes it better or worse.   She is on gabapentin  [phone] mg with some benefit,   She has neck pain,  LBP and mid back - worse with standing.   She notes a benefit from gabapentin    She had MBB/RFA in her neck with benefit   Migraines are done well on Qulipta  and she tolerates it well.   Only once in last 3 months.   She had milder benefit from topiramate .    [name] helps when one occurs   She notes a tremor in both hands.   It worsens with intention.  No known FH but she does not know dad's FH well.    Propranolol  has helped aand it has lowered the amplitude.   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  She has had abdominal pain since and will be having    The mass was found on T-spine MRI in 2023.   She sees pain management ad is on oxycodone .   Lamotrigine  had helped some.  Journavx  had not helped   She has insomnia and is on Dayvigo    No Vascular risks:  No tobacco or ETOH.  No HTN, DM, hyperlipidemia.    No cardiac issues.      MS History: In 2005, she had ataxia veering to the right when she was walking.   She was not dizzy but could not walk straight.   A brain MRI in showed one subtle right periventricular focus.   She received 3 days of IV steroid.   She got completely back to baseline by discharge.   She has had some sensoy fluctuation in her hands with numbness lasting minutes but not days.    She also has had soe fluctuations in her vision.   The spinal cord showed no lesions.  She has scattered brain lesions and there was one additional one in 2024 compared to previous MRI.  I had spoken with her - between her clinical history, CSF showing borderline number of OCB (3), MRI showing periventricular and juxtacortical lesions, new MRI with additional lesion that I believe she likely has MS.      She has been on glatiramer  09/2022.   Imaging: MRI of the brain 08/29/2004  shows a subtle right periventricular focus.   MRI of the brain 08/13/2016 shows a T2/flair hyperintense focus in the juxtacortical white matter of the posterior left frontal lobe and 2 small periventricular foci and 2 or 3 punctate foci in the deep white matter.   MRI of the brain 05/05/2017 showed no new lesions.   CSF 04/19/2017 showed 3 oligoclonal bands seen in the CSF not detected in the cervical (criteria was 4)   MRI cervical and thoracic spine 06/19/2022 showed a normal spinal cord, mild cervical DJD, thyroid  nodule and 12 cm liver mass   MRI brain 06/19/2022 showed 1 new lesion compared to 2018   REVIEW OF SYSTEMS: Out of a complete 14 system review of symptoms, the patient complains only of the following symptoms, insomnia, headaches, dysesthesias, and all other reviewed systems are negative.   ALLERGIES: Allergies[1]   HOME MEDICATIONS: Outpatient Medications Prior to Visit  Medication Sig Dispense Refill   AJOVY 225 MG/1.5ML SOAJ Inject 225 mg into the skin every 30 (thirty) days.     buPROPion  (WELLBUTRIN  XL) 300 MG 24 hr tablet Take 1 tablet (300 mg total) by mouth daily. 90 tablet 1   DAYVIGO 10 MG TABS Take 1 tablet by mouth at bedtime.     DULoxetine  (CYMBALTA ) 60 MG capsule Take 1 capsule (60 mg total) by mouth daily. 90 capsule 0   esomeprazole  (NEXIUM ) 40 MG capsule Take 1 capsule (40 mg total) by mouth daily. 90 capsule 1   Glatiramer  Acetate (GLATOPA ) 40 MG/ML SOSY Inject subcutaneously 3 times a week 12 mL 11   ibuprofen  (ADVIL ) 200 MG tablet Take 200 mg by mouth every 6 (six) hours as needed. 3x daily     lamoTRIgine  (LAMICTAL ) 200 MG tablet One po  bid 180 tablet 3   oxyCODONE -acetaminophen  (PERCOCET) 10-325 MG tablet Take 1 tablet by mouth 2 (two) times daily as needed for pain. 60 tablet 0   propranolol  ER (INDERAL  LA) 60 MG 24 hr capsule TAKE 1 CAPSULE BY MOUTH EVERY DAY 90 capsule 1   spironolactone  (ALDACTONE ) 50 MG tablet Take 1 tablet (50 mg total) by mouth  daily. 90 tablet 1   tiZANidine  (ZANAFLEX ) 4 MG tablet TAKE 1 TABLET BY MOUTH IN MORNING,1 TAB AT NOON,1 TAB IN EVENING,AND 1TAB AT BEDTIME 120 tablet 3   gabapentin  (NEURONTIN ) 600 MG tablet TAKE 1 TABLET BY MOUTH IN THE  MORNING, 1 TABLET IN THE  AFTERNOON, AND 2 TABLETS AT  BEDTIME 120 tablet 0   Glatiramer  Acetate 40 MG/ML SOSY Inject 40 mg into the skin 3 (three) times a week.     tamsulosin  (FLOMAX ) 0.4 MG CAPS capsule Take 1 capsule (0.4 mg total) by mouth daily. (Patient not taking: Reported on 09/25/2024) 30 capsule 11   fluticasone  (FLONASE ) 50 MCG/ACT nasal spray Place 2 sprays into both nostrils daily. (Patient not taking: Reported on 09/25/2024) 16 g 6   predniSONE  (DELTASONE ) 5 MG tablet 6-5-4-3-2-1-OFF (Patient not taking: Reported on 09/25/2024) 5 tablet 0   promethazine -dextromethorphan (PROMETHAZINE -DM) 6.25-15 MG/5ML syrup Take 5 mLs by mouth 4 (four) times daily as needed for cough. (Patient not taking: Reported on 09/25/2024) 118 mL 0   No facility-administered medications prior to visit.     PAST MEDICAL HISTORY: Past Medical History:  Diagnosis Date   Anxiety    Blood transfusion without reported diagnosis    Carpal tunnel syndrome    Chronic lower back pain    Chronic tension headaches    Constipation    Depression    Dysmenorrhea    Endometrial polyp    Fatigue    Frozen shoulder    GERD (gastroesophageal reflux disease)    History of lumbar puncture 08/2016   HPV in female    Hyperlipidemia    Low serum vitamin D     Neck pain, chronic    Pelvic pain    Thyroid  nodule    Tinnitus, bilateral    TMJ (temporomandibular joint disorder)    White matter abnormality on MRI of brain      PAST SURGICAL HISTORY: Past Surgical History:  Procedure Laterality Date   CHOLECYSTECTOMY     DE QUERVAIN'S RELEASE Bilateral    DILATATION & CURETTAGE/HYSTEROSCOPY WITH MYOSURE N/A 10/24/2017   Procedure: DILATATION & CURETTAGE/HYSTEROSCOPY WITH MYOSURE;  Surgeon:  Jannis Kate Norris, MD;  Location: [facility] [place];  Service: Gynecology;  Laterality: N/A;   IUD REMOVAL N/A 10/24/2017   Procedure: INTRAUTERINE DEVICE (IUD) REMOVAL;  Surgeon: Jertson, Jill Evelyn, MD;  Location: [facility];  Service: Gynecology;  Laterality: N/A;   TONSILLECTOMY     WISDOM TOOTH EXTRACTION     WRIST SURGERY Bilateral    2 ganglion cyst removed     FAMILY HISTORY: Family History  Problem Relation Age of Onset   ADD / ADHD Mother    Colon polyps Mother    Hearing loss Mother    Miscarriages / Stillbirths Mother    Varicose Veins Mother    Colon polyps Father    Alcohol abuse Father    Stroke Father    Colon cancer Maternal Grandmother    Cancer Maternal Grandmother    Colon polyps Maternal Grandfather    Diabetes Maternal Grandfather    Alcohol abuse Maternal Grandfather    Obesity Maternal Grandfather    Colon polyps Paternal Grandmother    Colon polyps Paternal Grandfather    Cancer Paternal Grandfather    Arthritis Paternal Grandfather    Anxiety disorder Son    ADD / ADHD Son    Depression Sister    Anxiety disorder Sister    Hearing loss Maternal Aunt    Obesity Maternal Aunt    Varicose Veins Maternal Aunt    Alcohol abuse Maternal Uncle    Cancer Maternal Uncle    Alcohol abuse Maternal Uncle    Diabetes Maternal Uncle    Learning disabilities Maternal Uncle  Early death Maternal Uncle    Obesity Maternal Uncle    Alcohol abuse Paternal Uncle    Stroke Paternal Uncle    Early death Paternal Uncle      SOCIAL HISTORY: Social History   Socioeconomic History   Marital status: Divorced    Spouse name: Will   Number of children: 1   Years of education: Not on file   Highest education level: Not on file  Occupational History    Employer: [employer]   Occupation: CMA  Tobacco Use   Smoking status: Former    Current packs/day: 0.00    Average packs/day: 18.0 packs/day for 1 year (18.0 ttl pk-yrs)     Types: Cigarettes    Start date: 12/30/2011    Quit date: 12/29/2012    Years since quitting: 11.7   Smokeless tobacco: Never  Vaping Use   Vaping status: Never Used  Substance and Sexual Activity   Alcohol use: Not Currently    Alcohol/week: 1.0 - 2.0 standard drink of alcohol    Comment: Boyfriend was drinking too much, so he stopped. I respect that decision so I dont drink in the house. I will have the occasional drink when eating out with my son or friends.   Drug use: Never   Sexual activity: Yes    Partners: Male    Birth control/protection: Injection  Other Topics Concern   Not on file  Social History Narrative   Not on file   Social Drivers of Health   Tobacco Use: Medium Risk (09/25/2024)   Patient History    Smoking Tobacco Use: Former    Smokeless Tobacco Use: Never    Passive Exposure: Not on file  Financial Resource Strain: Medium Risk (09/06/2024)   Overall Financial Resource Strain (CARDIA)    Difficulty of Paying Living Expenses: Somewhat hard  Food Insecurity: No Food Insecurity (09/06/2024)   Epic    Worried About Radiation Protection Practitioner of Food in the Last Year: Never true    Ran Out of Food in the Last Year: Never true  Transportation Needs: No Transportation Needs (09/06/2024)   Epic    Lack of Transportation (Medical): No    Lack of Transportation (Non-Medical): No  Physical Activity: Insufficiently Active (09/06/2024)   Exercise Vital Sign    Days of Exercise per Week: 3 days    Minutes of Exercise per Session: 20 min  Stress: Stress Concern Present (09/06/2024)   [employer] of Occupational Health - Occupational Stress Questionnaire    Feeling of Stress: Rather much  Social Connections: Moderately Isolated (09/06/2024)   Social Connection and Isolation Panel    Frequency of Communication with Friends and Family: Twice a week    Frequency of Social Gatherings with Friends and Family: Twice a week    Attends Religious Services: Never    Doctor, General Practice or Organizations: Yes    Attends Banker Meetings: Never    Marital Status: Divorced  Catering Manager Violence: Not on file  Depression (PHQ2-9): Medium Risk (09/07/2024)   Depression (PHQ2-9)    PHQ-2 Score: 9  Alcohol Screen: Low Risk (09/06/2024)   Alcohol Screen    Last Alcohol Screening Score (AUDIT): 1  Housing: Low Risk (09/06/2024)   Epic    Unable to Pay for Housing in the Last Year: No    Number of Times Moved in the Last Year: 0    Homeless in the Last Year: No  Utilities: Not At Risk (10/07/2022)  Received from [facility] Utilities    Threatened with loss of utilities: No  Health Literacy: Not on file     PHYSICAL EXAM  Vitals:   09/25/24 1131  Weight: 138 lb (62.6 kg)  Height: 5' 4 (1.626 m)   Body mass index is 23.69 kg/m.  Generalized: Well developed, in no acute distress  Cardiology: normal rate and rhythm, no murmur auscultated  Respiratory: clear to auscultation bilaterally    Neurological examination  Mentation: Alert oriented to time, place, history taking. Follows all commands speech and language fluent Cranial nerve II-XII: Pupils were equal round reactive to light. Extraocular movements were full, visual field were full on confrontational test. Facial sensation and strength were normal. Uvula tongue midline. Head turning and shoulder shrug  were normal and symmetric. Motor: The motor testing reveals 5 over 5 strength of all 4 extremities. Good symmetric motor tone is noted throughout.  Sensory: Sensory testing is intact to soft touch on all 4 extremities. No evidence of extinction is noted.  Coordination: Cerebellar testing reveals good finger-nose-finger and heel-to-shin bilaterally.  Gait and station: Gait is normal.    DIAGNOSTIC DATA (LABS, IMAGING, TESTING) - I reviewed patient records, labs, notes, testing and imaging myself where available.  Lab Results  Component Value Date   WBC 10.8  03/13/2024   HGB 14.1 03/13/2024   HCT 43.1 03/13/2024   MCV 101 (H) 03/13/2024   PLT 271 03/13/2024      Component Value Date/Time   NA 141 11/15/2023 0839   NA 142 10/30/2020 0807   K 4.1 11/15/2023 0839   CL 105 11/15/2023 0839   CO2 26 11/15/2023 0839   GLUCOSE 121 (H) 11/15/2023 0839   BUN 15 11/15/2023 0839   BUN 11 10/30/2020 0807   CREATININE 0.77 11/15/2023 0839   CALCIUM 9.1 11/15/2023 0839   PROT 6.2 (L) 11/15/2023 0839   PROT 7.1 10/30/2020 0807   ALBUMIN 4.3 11/15/2023 0839   ALBUMIN 4.8 10/30/2020 0807   AST 9 (L) 11/15/2023 0839   ALT 8 11/15/2023 0839   ALKPHOS 67 11/15/2023 0839   BILITOT 0.4 11/15/2023 0839   BILITOT <0.2 10/30/2020 0807   GFRNONAA >60 11/15/2023 0839   GFRAA 125 10/30/2020 0807   Lab Results  Component Value Date   CHOL 212 (H) 11/07/2020   HDL 59.40 11/07/2020   LDLCALC 124 (H) 11/07/2020   TRIG 141.0 11/07/2020   CHOLHDL 4 11/07/2020   Lab Results  Component Value Date   HGBA1C 5.6 05/16/2019   Lab Results  Component Value Date   VITAMINB12 350 06/10/2022   Lab Results  Component Value Date   TSH 3.60 11/07/2020        No data to display               No data to display           ASSESSMENT AND PLAN  49 y.o. year old female  has a past medical history of Anxiety, Blood transfusion without reported diagnosis, Carpal tunnel syndrome, Chronic lower back pain, Chronic tension headaches, Constipation, Depression, Dysmenorrhea, Endometrial polyp, Fatigue, Frozen shoulder, GERD (gastroesophageal reflux disease), History of lumbar puncture (08/2016), HPV in female, Hyperlipidemia, Low serum vitamin D , Neck pain, chronic, Pelvic pain, Thyroid  nodule, Tinnitus, bilateral, TMJ (temporomandibular joint disorder), and White matter abnormality on MRI of brain. here with    Multiple sclerosis - Plan: MR BRAIN W WO CONTRAST  High risk medication use  Gait disturbance  Migraine with aura and without status migrainosus,  not intractable  Essential tremor  Alayne M Archibeque is doing well. We will continue Glatopa . Continue labs through PCP. We will update MRI. She will continue gabapentin  600/600/1200, lamotrigine  200mg  BID, propranolol  LA 60mg  daily and Flomax  as needed. Healthy lifestyle habits encouraged. She will follow up with PCP as directed. She will return to see Dr Vear in 6 months, sooner if needed. She verbalizes understanding and agreement with this plan.   Orders Placed This Encounter  Procedures   MR BRAIN W WO CONTRAST    Please order with [facility]. Fax number: [phone]    Standing Status:   Future    Expiration Date:   09/25/2025    If indicated for the ordered procedure, I authorize the administration of contrast media per Radiology protocol:   Yes    What is the patient's sedation requirement?:   No Sedation    Does the patient have a pacemaker or implanted devices?:   No    Radiology Contrast Protocol - do NOT remove file path:   [email or web address]    Preferred imaging location?:   External     Meds ordered this encounter  Medications   gabapentin  (NEURONTIN ) 600 MG tablet    Sig: TAKE 1 TABLET BY MOUTH IN THE  MORNING, 1 TABLET IN THE  AFTERNOON, AND 2 TABLETS AT  BEDTIME    Dispense:  120 tablet    Refill:  5    Please send a replace/new response with 90-Day Supply if appropriate to maximize member benefit. Requesting 1 year supply.    Supervising Provider:   YAN, YIJUN [[id number]]     Crystalmarie Yasin Cary, MSN, FNP-C 09/25/2024, 12:28 PM  [facility] [address] [place], [place] [address] [phone]     [1]  Allergies Allergen Reactions   Codeine Nausea Only and Other (See Comments)    This abdominal pain was following liquid codeine, has tolerated hydrocodone , oxycodone , tramadol  well   Dexlansoprazole  Diarrhea    Constant diarrhea   Pseudoeph-Bromphen-Dm Other (See Comments)    Anxious - couldn't sit still    Other Other (See Comments)    Anxious - couldn't sit still (brompheniramine DM)   "

## 2024-09-26 ENCOUNTER — Telehealth: Payer: Self-pay | Admitting: Family Medicine

## 2024-09-26 NOTE — Telephone Encounter (Signed)
 Sent Per Order: Please order with [facility]. Fax number: [phone]

## 2024-09-28 ENCOUNTER — Encounter: Payer: Self-pay | Admitting: Registered Nurse

## 2024-09-28 ENCOUNTER — Encounter: Attending: Registered Nurse | Admitting: Registered Nurse

## 2024-09-28 ENCOUNTER — Encounter: Admitting: Registered Nurse

## 2024-09-28 VITALS — BP 102/69 | HR 80 | Ht 64.0 in | Wt 136.4 lb

## 2024-09-28 DIAGNOSIS — M545 Low back pain, unspecified: Secondary | ICD-10-CM | POA: Insufficient documentation

## 2024-09-28 DIAGNOSIS — M546 Pain in thoracic spine: Secondary | ICD-10-CM | POA: Insufficient documentation

## 2024-09-28 DIAGNOSIS — G8929 Other chronic pain: Secondary | ICD-10-CM | POA: Insufficient documentation

## 2024-09-28 DIAGNOSIS — Z79891 Long term (current) use of opiate analgesic: Secondary | ICD-10-CM | POA: Insufficient documentation

## 2024-09-28 DIAGNOSIS — Z5181 Encounter for therapeutic drug level monitoring: Secondary | ICD-10-CM | POA: Insufficient documentation

## 2024-09-28 DIAGNOSIS — M542 Cervicalgia: Secondary | ICD-10-CM | POA: Diagnosis not present

## 2024-09-28 DIAGNOSIS — G894 Chronic pain syndrome: Secondary | ICD-10-CM | POA: Insufficient documentation

## 2024-09-28 DIAGNOSIS — M25511 Pain in right shoulder: Secondary | ICD-10-CM | POA: Insufficient documentation

## 2024-09-28 MED ORDER — OXYCODONE-ACETAMINOPHEN 10-325 MG PO TABS: 1.0000 | ORAL_TABLET | Freq: Three times a day (TID) | ORAL | 0 refills | Status: AC | PRN

## 2024-09-28 MED ORDER — OXYCODONE-ACETAMINOPHEN 10-325 MG PO TABS: 1.0000 | ORAL_TABLET | Freq: Three times a day (TID) | ORAL | 0 refills | Status: DC | PRN

## 2024-09-28 NOTE — Telephone Encounter (Signed)
 Please see 2 messages from pt. She has decided to hold off on the Phentermine as exercise/movement seem to be helping. However, her cough has started to get worse and it is hurting her side to cough. She did rapid covid and flu tests, both negative. Pt is requesting refill of her cough medication. Appt? Please advise

## 2024-09-28 NOTE — Progress Notes (Unsigned)
 "  Subjective:    Patient ID: [name], female    DOB: 12-May-1976, 49 y.o.   MRN: [id number]  HPI: [name] is a 49 y.o. female who returns for follow up appointment for chronic pain and medication refill. She states her pain is located in her neck,  bilateral hands with tingling and burning lower back and bilateral lower extremities reports achy pain.  She reports she is receiving 6 hours of relief with her current medication regimen, we will increased her medication frequency, she verbalizes understanding. She rates her pain 4. Her current exercise regime is walking and performing stretching exercises.  [name]  equivalent is 30.00 MME.   Oral Swab was Performed today.      Pain Inventory Average Pain 6 Pain Right Now 4 My pain is sharp, burning, stabbing, tingling, and aching  In the last 24 hours, has pain interfered with the following? General activity 6 Relation with others 3 Enjoyment of life 4 What TIME of day is your pain at its worst? morning , evening, and night Sleep (in general) Poor  Pain is worse with: walking, bending, sitting, standing, and some activites Pain improves with: rest, pacing activities, medication, and injections Relief from Meds: 4  Family History  Problem Relation Age of Onset   ADD / ADHD Mother    Colon polyps Mother    Hearing loss Mother    Miscarriages / Stillbirths Mother    Varicose Veins Mother    Colon polyps Father    Alcohol abuse Father    Stroke Father    Colon cancer Maternal Grandmother    Cancer Maternal Grandmother    Colon polyps Maternal Grandfather    Diabetes Maternal Grandfather    Alcohol abuse Maternal Grandfather    Obesity Maternal Grandfather    Colon polyps Paternal Grandmother    Colon polyps Paternal Grandfather    Cancer Paternal Grandfather    Arthritis Paternal Grandfather    Anxiety disorder Son    ADD / ADHD Son    Depression Sister    Anxiety disorder Sister    Hearing loss  Maternal Aunt    Obesity Maternal Aunt    Varicose Veins Maternal Aunt    Alcohol abuse Maternal Uncle    Cancer Maternal Uncle    Alcohol abuse Maternal Uncle    Diabetes Maternal Uncle    Learning disabilities Maternal Uncle    Early death Maternal Uncle    Obesity Maternal Uncle    Alcohol abuse Paternal Uncle    Stroke Paternal Uncle    Early death Paternal Uncle    Social History   Socioeconomic History   Marital status: Divorced    Spouse name: Will   Number of children: 1   Years of education: Not on file   Highest education level: Not on file  Occupational History    Employer: [employer]   Occupation: CMA  Tobacco Use   Smoking status: Former    Current packs/day: 0.00    Average packs/day: 18.0 packs/day for 1 year (18.0 ttl pk-yrs)    Types: Cigarettes    Start date: 12/30/2011    Quit date: 12/29/2012    Years since quitting: 11.7   Smokeless tobacco: Never  Vaping Use   Vaping status: Never Used  Substance and Sexual Activity   Alcohol use: Not Currently    Alcohol/week: 1.0 - 2.0 standard drink of alcohol    Comment: Boyfriend was drinking too much, so he  stopped. I respect that decision so I dont drink in the house. I will have the occasional drink when eating out with my son or friends.   Drug use: Never   Sexual activity: Yes    Partners: Male    Birth control/protection: Injection  Other Topics Concern   Not on file  Social History Narrative   Not on file   Social Drivers of Health   Tobacco Use: Medium Risk (09/28/2024)   Patient History    Smoking Tobacco Use: Former    Smokeless Tobacco Use: Never    Passive Exposure: Not on file  Financial Resource Strain: Medium Risk (09/06/2024)   Overall Financial Resource Strain (CARDIA)    Difficulty of Paying Living Expenses: Somewhat hard  Food Insecurity: No Food Insecurity (09/06/2024)   Epic    Worried About Radiation Protection Practitioner of Food in the Last Year: Never true    Ran Out of Food in the Last Year:  Never true  Transportation Needs: No Transportation Needs (09/06/2024)   Epic    Lack of Transportation (Medical): No    Lack of Transportation (Non-Medical): No  Physical Activity: Insufficiently Active (09/06/2024)   Exercise Vital Sign    Days of Exercise per Week: 3 days    Minutes of Exercise per Session: 20 min  Stress: Stress Concern Present (09/06/2024)   [employer] of Occupational Health - Occupational Stress Questionnaire    Feeling of Stress: Rather much  Social Connections: Moderately Isolated (09/06/2024)   Social Connection and Isolation Panel    Frequency of Communication with Friends and Family: Twice a week    Frequency of Social Gatherings with Friends and Family: Twice a week    Attends Religious Services: Never    Database Administrator or Organizations: Yes    Attends Banker Meetings: Never    Marital Status: Divorced  Depression (PHQ2-9): Medium Risk (09/07/2024)   Depression (PHQ2-9)    PHQ-2 Score: 9  Alcohol Screen: Low Risk (09/06/2024)   Alcohol Screen    Last Alcohol Screening Score (AUDIT): 1  Housing: Low Risk (09/06/2024)   Epic    Unable to Pay for Housing in the Last Year: No    Number of Times Moved in the Last Year: 0    Homeless in the Last Year: No  Utilities: Not At Risk (10/07/2022)   Received from [facility] Utilities    Threatened with loss of utilities: No  Health Literacy: Not on file   Past Surgical History:  Procedure Laterality Date   CHOLECYSTECTOMY     DE QUERVAIN'S RELEASE Bilateral    DILATATION & CURETTAGE/HYSTEROSCOPY WITH MYOSURE N/A 10/24/2017   Procedure: DILATATION & CURETTAGE/HYSTEROSCOPY WITH MYOSURE;  Surgeon: Jannis Kate Norris, MD;  Location: [facility];  Service: Gynecology;  Laterality: N/A;   IUD REMOVAL N/A 10/24/2017   Procedure: INTRAUTERINE DEVICE (IUD) REMOVAL;  Surgeon: Jertson, Jill Evelyn, MD;  Location: [facility];   Service: Gynecology;  Laterality: N/A;   TONSILLECTOMY     WISDOM TOOTH EXTRACTION     WRIST SURGERY Bilateral    2 ganglion cyst removed   Past Surgical History:  Procedure Laterality Date   CHOLECYSTECTOMY     DE QUERVAIN'S RELEASE Bilateral    DILATATION & CURETTAGE/HYSTEROSCOPY WITH MYOSURE N/A 10/24/2017   Procedure: DILATATION & CURETTAGE/HYSTEROSCOPY WITH MYOSURE;  Surgeon: Jannis Kate Norris, MD;  Location: [facility];  Service: Gynecology;  Laterality: N/A;  IUD REMOVAL N/A 10/24/2017   Procedure: INTRAUTERINE DEVICE (IUD) REMOVAL;  Surgeon: Jertson, Jill Evelyn, MD;  Location: [facility];  Service: Gynecology;  Laterality: N/A;   TONSILLECTOMY     WISDOM TOOTH EXTRACTION     WRIST SURGERY Bilateral    2 ganglion cyst removed   Past Medical History:  Diagnosis Date   Anxiety    Blood transfusion without reported diagnosis    Carpal tunnel syndrome    Chronic lower back pain    Chronic tension headaches    Constipation    Depression    Dysmenorrhea    Endometrial polyp    Fatigue    Frozen shoulder    GERD (gastroesophageal reflux disease)    History of lumbar puncture 08/2016   HPV in female    Hyperlipidemia    Low serum vitamin D     Neck pain, chronic    Pelvic pain    Thyroid  nodule    Tinnitus, bilateral    TMJ (temporomandibular joint disorder)    White matter abnormality on MRI of brain    BP (!) 87/65 (BP Location: Left Arm, Patient Position: Sitting, Cuff Size: Normal)   Pulse 80   Ht 5' 4 (1.626 m)   Wt 136 lb 6.4 oz (61.9 kg)   LMP 08/28/2024   SpO2 97%   BMI 23.41 kg/m   Opioid Risk Score:   Fall Risk Score:  `1  Depression screen [facility] 2/9     09/07/2024    4:04 PM 05/11/2024   10:06 AM 04/12/2024   10:07 AM 02/09/2024    9:50 AM 01/10/2024   10:37 AM 11/07/2020   11:15 AM 11/09/2019    1:26 PM  Depression screen PHQ 2/9  Decreased Interest 1 0 0 0 2 1 1   Down, Depressed, Hopeless 1 0 0 0 1 0 0  PHQ  - 2 Score 2 0 0 0 3 1 1   Altered sleeping 3    2 2 2   Tired, decreased energy 3    2 1 2   Change in appetite 0    0 0 0  Feeling bad or failure about yourself  1    0 0 0  Trouble concentrating 0    1 1 0  Moving slowly or fidgety/restless 0    0 0 0  Suicidal thoughts 0    0 0 0  PHQ-9 Score 9    8  5  5    Difficult doing work/chores Somewhat difficult    Somewhat difficult Not difficult at all Somewhat difficult     Data saved with a previous flowsheet row definition    Review of Systems  Musculoskeletal:  Positive for back pain, myalgias and neck pain.       Neck pain, left shoulder pain, low back pain, bilateral leg pain, right sided pain  All other systems reviewed and are negative.      Objective:   Physical Exam Vitals and nursing note reviewed.  Constitutional:      Appearance: Normal appearance.  Cardiovascular:     Rate and Rhythm: Normal rate and regular rhythm.     Pulses: Normal pulses.     Heart sounds: Normal heart sounds.  Pulmonary:     Effort: Pulmonary effort is normal.     Breath sounds: Normal breath sounds.  Musculoskeletal:     Comments: Normal Muscle Bulk and Muscle Testing Reveals:  Upper Extremities: Full ROM and Muscle Strength 5/5 Lumbar Paraspinal Tenderness:  L-3-L-5 Lower Extremities: Full ROM and Muscle Strength 5/5 Arises from Table with ease Narrow Based  Gait     Skin:    General: Skin is warm and dry.  Neurological:     Mental Status: She is alert and oriented to person, place, and time.  Psychiatric:        Mood and Affect: Mood normal.        Behavior: Behavior normal.          Assessment & Plan:  Adhesive Capsulitis of left shoulder: Continue HEP a Tolerated. Continue to Monitor. 09/28/2024 Pain at surgical site: No complaints today. Continue current medication regimen. Continue to Monitor. 09/28/2024 Neuropathic Pain: She is scheduled for [place] with Dr Onnie. Continue to Monitor. 09/28/2024 Multiple Sclerosis :  Neurology Following. Continue to Monitor 09/28/2024 Chronic Pain Syndrome: Refilled: Increased: Oxycodone  10/325 mg one tablet three times a day as needed for pain #90. Second script sent for the following month. We will continue the opioid monitoring program, this consists of regular clinic visits, examinations, urine drug screen, pill counts as well as use of [place]  Controlled Substance Reporting system. A 12 month History has been reviewed on the [place]  Controlled Substance Reporting System on 09/28/2024     F/U in 2 months     "

## 2024-10-02 MED ORDER — PROMETHAZINE-DM 6.25-15 MG/5ML PO SYRP: 5.0000 mL | ORAL_SOLUTION | Freq: Four times a day (QID) | ORAL | 0 refills | Status: AC | PRN

## 2024-10-02 NOTE — Addendum Note (Signed)
 Addended by: PRENTISS FRIEZE R on: 10/02/2024 04:07 PM   Modules accepted: Orders

## 2024-10-03 LAB — DRUG TOX MONITOR 1 W/CONF, ORAL FLD
Amphetamines: NEGATIVE ng/mL
Barbiturates: NEGATIVE ng/mL
Benzodiazepines: NEGATIVE ng/mL
Buprenorphine: NEGATIVE ng/mL
Cocaine: NEGATIVE ng/mL
Cotinine: 6.5 ng/mL — ABNORMAL HIGH
Fentanyl: NEGATIVE ng/mL
Heroin Metabolite: NEGATIVE ng/mL
MARIJUANA: NEGATIVE ng/mL
MDMA: NEGATIVE ng/mL
Meprobamate: NEGATIVE ng/mL
Methadone: NEGATIVE ng/mL
Nicotine Metabolite: POSITIVE ng/mL — AB
Opiates: NEGATIVE ng/mL
Phencyclidine: NEGATIVE ng/mL
THC: NEGATIVE ng/mL
Tapentadol: NEGATIVE ng/mL
Tramadol: NEGATIVE ng/mL
Zolpidem: NEGATIVE ng/mL

## 2024-10-03 LAB — DRUG TOX ALC METAB W/CON, ORAL FLD: Alcohol Metabolite: NEGATIVE ng/mL

## 2024-10-11 ENCOUNTER — Ambulatory Visit: Admitting: Family Medicine

## 2024-11-02 ENCOUNTER — Encounter: Admitting: Registered Nurse

## 2024-11-23 ENCOUNTER — Encounter: Admitting: Registered Nurse

## 2024-12-07 ENCOUNTER — Ambulatory Visit: Admitting: Family Medicine

## 2025-05-28 ENCOUNTER — Ambulatory Visit: Admitting: Neurology
